# Patient Record
Sex: Female | Born: 1990 | Hispanic: Yes | Marital: Married | Smoking: Never smoker
Health system: Southern US, Community
[De-identification: ages and names within clinical notes are randomized; demographics above are authoritative.]

## PROBLEM LIST (undated history)

## (undated) DIAGNOSIS — F419 Anxiety disorder, unspecified: Secondary | ICD-10-CM

## (undated) DIAGNOSIS — J45909 Unspecified asthma, uncomplicated: Secondary | ICD-10-CM

## (undated) DIAGNOSIS — N200 Calculus of kidney: Secondary | ICD-10-CM

## (undated) DIAGNOSIS — E785 Hyperlipidemia, unspecified: Secondary | ICD-10-CM

## (undated) DIAGNOSIS — D259 Leiomyoma of uterus, unspecified: Secondary | ICD-10-CM

## (undated) DIAGNOSIS — R519 Headache, unspecified: Secondary | ICD-10-CM

## (undated) DIAGNOSIS — O10019 Pre-existing essential hypertension complicating pregnancy, unspecified trimester: Secondary | ICD-10-CM

## (undated) DIAGNOSIS — K760 Fatty (change of) liver, not elsewhere classified: Secondary | ICD-10-CM

## (undated) DIAGNOSIS — O99820 Streptococcus B carrier state complicating pregnancy: Secondary | ICD-10-CM

## (undated) DIAGNOSIS — G4700 Insomnia, unspecified: Secondary | ICD-10-CM

## (undated) DIAGNOSIS — R51 Headache: Secondary | ICD-10-CM

## (undated) DIAGNOSIS — O24419 Gestational diabetes mellitus in pregnancy, unspecified control: Secondary | ICD-10-CM

## (undated) HISTORY — DX: Fatty (change of) liver, not elsewhere classified: K76.0

## (undated) HISTORY — DX: Streptococcus B carrier state complicating pregnancy: O99.820

## (undated) HISTORY — DX: Hyperlipidemia, unspecified: E78.5

## (undated) HISTORY — DX: Headache, unspecified: R51.9

## (undated) HISTORY — DX: Calculus of kidney: N20.0

## (undated) HISTORY — DX: Headache: R51

## (undated) HISTORY — DX: Anxiety disorder, unspecified: F41.9

## (undated) HISTORY — DX: Pre-existing essential hypertension complicating pregnancy, unspecified trimester: O10.019

## (undated) HISTORY — DX: Leiomyoma of uterus, unspecified: D25.9

## (undated) HISTORY — DX: Insomnia, unspecified: G47.00

## (undated) HISTORY — DX: Gestational diabetes mellitus in pregnancy, unspecified control: O24.419

---
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
## 2010-06-21 ENCOUNTER — Ambulatory Visit: Payer: Self-pay | Admitting: Diagnostic Radiology

## 2010-06-21 ENCOUNTER — Emergency Department (HOSPITAL_BASED_OUTPATIENT_CLINIC_OR_DEPARTMENT_OTHER): Admission: EM | Admit: 2010-06-21 | Discharge: 2010-06-22 | Payer: Self-pay | Admitting: Emergency Medicine

## 2010-09-17 ENCOUNTER — Emergency Department (HOSPITAL_BASED_OUTPATIENT_CLINIC_OR_DEPARTMENT_OTHER): Admission: EM | Admit: 2010-09-17 | Discharge: 2010-09-17 | Payer: Self-pay | Admitting: Emergency Medicine

## 2011-02-15 ENCOUNTER — Ambulatory Visit (HOSPITAL_BASED_OUTPATIENT_CLINIC_OR_DEPARTMENT_OTHER)
Admission: RE | Admit: 2011-02-15 | Discharge: 2011-02-15 | Disposition: A | Discharge status: Home / Self Care | Payer: PRIVATE HEALTH INSURANCE | Source: Ambulatory Visit | Attending: Plastic Surgery | Admitting: Plastic Surgery

## 2011-02-15 ENCOUNTER — Other Ambulatory Visit: Payer: Self-pay | Admitting: Plastic Surgery

## 2011-02-15 DIAGNOSIS — N62 Hypertrophy of breast: Secondary | ICD-10-CM | POA: Insufficient documentation

## 2011-02-15 DIAGNOSIS — E669 Obesity, unspecified: Secondary | ICD-10-CM | POA: Insufficient documentation

## 2011-02-15 LAB — POCT HEMOGLOBIN-HEMACUE: Hemoglobin: 14 g/dL (ref 12.0–15.0)

## 2011-03-13 LAB — URINALYSIS, ROUTINE W REFLEX MICROSCOPIC
Bilirubin Urine: NEGATIVE
Ketones, ur: NEGATIVE mg/dL
Nitrite: NEGATIVE
Specific Gravity, Urine: 1.025 (ref 1.005–1.030)

## 2011-03-13 LAB — DIFFERENTIAL
Basophils Absolute: 0.1 10*3/uL (ref 0.0–0.1)
Eosinophils Relative: 8 % — ABNORMAL HIGH (ref 0–5)
Lymphs Abs: 3.7 10*3/uL (ref 0.7–4.0)
Monocytes Relative: 6 % (ref 3–12)
Neutro Abs: 9 10*3/uL — ABNORMAL HIGH (ref 1.7–7.7)

## 2011-03-13 LAB — CBC
Hemoglobin: 14 g/dL (ref 12.0–15.0)
MCH: 31 pg (ref 26.0–34.0)
WBC: 14.7 10*3/uL — ABNORMAL HIGH (ref 4.0–10.5)

## 2011-03-13 LAB — HEPATIC FUNCTION PANEL
ALT: 39 U/L — ABNORMAL HIGH (ref 0–35)
Albumin: 4.6 g/dL (ref 3.5–5.2)
Alkaline Phosphatase: 101 U/L (ref 39–117)
Bilirubin, Direct: 0 mg/dL (ref 0.0–0.3)
Total Bilirubin: 0.6 mg/dL (ref 0.3–1.2)
Total Protein: 8.4 g/dL — ABNORMAL HIGH (ref 6.0–8.3)

## 2011-03-13 LAB — BASIC METABOLIC PANEL
BUN: 10 mg/dL (ref 6–23)
CO2: 23 mEq/L (ref 19–32)
Calcium: 9.7 mg/dL (ref 8.4–10.5)
Chloride: 102 mEq/L (ref 96–112)
GFR calc Af Amer: >60 mL/min (ref >60)

## 2011-03-13 LAB — URINE MICROSCOPIC-ADD ON

## 2011-03-13 LAB — PREGNANCY, URINE: Preg Test, Ur: NEGATIVE

## 2012-12-26 NOTE — L&D Delivery Note (Signed)
Operative Delivery Note At 3:11 PM a viable female was delivered via Vaginal, Vacuum Neurosurgeon).  Presentation: vertex; Position: Left,, Occiput,, Anterior; Station: +2.  Vacuum placed for fetal heart rate recurrent Decels. Vacuum placed by dr. Leslie Andrea and confirmed by Dr. Sheldon Silvan. Delivered with no pop offs.   Verbal consent: obtained from patient.  Risks and benefits discussed in detail.  Risks include, but are not limited to the risks of anesthesia, bleeding, infection, damage to maternal tissues, fetal cephalhematoma.  There is also the risk of inability to effect vaginal delivery of the head, or shoulder dystocia that cannot be resolved by established maneuvers, leading to the need for emergency cesarean section.  APGAR: 6, 8; weight .   Placenta status: Intact, Spontaneous.   Cord: 3 vessels with the following complications: None.    Anesthesia: Epidural  Instruments: Kiwi vacuum Episiotomy: NA Lacerations: Deep posterior vaginal tear and left perilabial tear Suture Repair: 3.0 vicryl on CT Est. Blood Loss (mL):  450cc  Deep post vaginal laceration repaired with 3.0 vicryl on CT with assistance of Dr. Ihor Dow. Pt did not have any rectal communication. Confirmed by Dr. Sheldon Silvan.  Mom to AICU.  Baby to Nursery monitoring for pulse ox.  Fredrik Rigger 09/01/2013, 4:01 PM

## 2013-05-03 LAB — OB RESULTS CONSOLE RUBELLA ANTIBODY, IGM: Rubella: IMMUNE

## 2013-05-03 LAB — OB RESULTS CONSOLE RPR: RPR: NONREACTIVE

## 2013-05-03 LAB — OB RESULTS CONSOLE HIV ANTIBODY (ROUTINE TESTING): HIV: NONREACTIVE

## 2013-05-03 LAB — OB RESULTS CONSOLE HGB/HCT, BLOOD
HCT: 13 %
Hemoglobin: 38.8 g/dL

## 2013-05-03 LAB — OB RESULTS CONSOLE ABO/RH: RH Type: POSITIVE

## 2013-07-23 ENCOUNTER — Encounter: Payer: Self-pay | Admitting: *Deleted

## 2013-07-29 ENCOUNTER — Ambulatory Visit (INDEPENDENT_AMBULATORY_CARE_PROVIDER_SITE_OTHER): Payer: 59 | Admitting: Obstetrics & Gynecology

## 2013-07-29 ENCOUNTER — Encounter: Payer: Self-pay | Admitting: Obstetrics & Gynecology

## 2013-07-29 ENCOUNTER — Encounter: Payer: Self-pay | Admitting: *Deleted

## 2013-07-29 VITALS — BP 144/103 | Temp 97.2°F | Wt 166.6 lb

## 2013-07-29 DIAGNOSIS — O10913 Unspecified pre-existing hypertension complicating pregnancy, third trimester: Secondary | ICD-10-CM

## 2013-07-29 DIAGNOSIS — O10019 Pre-existing essential hypertension complicating pregnancy, unspecified trimester: Secondary | ICD-10-CM

## 2013-07-29 DIAGNOSIS — O169 Unspecified maternal hypertension, unspecified trimester: Secondary | ICD-10-CM

## 2013-07-29 DIAGNOSIS — O0933 Supervision of pregnancy with insufficient antenatal care, third trimester: Secondary | ICD-10-CM | POA: Insufficient documentation

## 2013-07-29 DIAGNOSIS — O0930 Supervision of pregnancy with insufficient antenatal care, unspecified trimester: Secondary | ICD-10-CM

## 2013-07-29 DIAGNOSIS — Z23 Encounter for immunization: Secondary | ICD-10-CM

## 2013-07-29 HISTORY — DX: Pre-existing essential hypertension complicating pregnancy, unspecified trimester: O10.019

## 2013-07-29 LAB — POCT URINALYSIS DIP (DEVICE)
Bilirubin Urine: NEGATIVE
Glucose, UA: NEGATIVE mg/dL
Ketones, ur: NEGATIVE mg/dL
Nitrite: NEGATIVE
Protein, ur: NEGATIVE mg/dL
Specific Gravity, Urine: 1.015 (ref 1.005–1.030)
Urobilinogen, UA: 0.2 mg/dL (ref 0.0–1.0)
pH: 7 (ref 5.0–8.0)

## 2013-07-29 MED ORDER — LABETALOL HCL 100 MG PO TABS: 100.0000 mg | ORAL_TABLET | Freq: Two times a day (BID) | ORAL | Status: DC

## 2013-07-29 MED ORDER — TETANUS-DIPHTH-ACELL PERTUSSIS 5-2.5-18.5 LF-MCG/0.5 IM SUSP: 0.5000 mL | Freq: Once | INTRAMUSCULAR | Status: DC

## 2013-07-29 NOTE — Progress Notes (Signed)
Transfer from [place], last seen 2 mo ago. CHTN, was on aldomet.   Subjective:transfer from [place], last seen 2 mo ago    Shelly Adams is a G1P0 43w5dbeing seen today for her first obstetrical visit. EDC by [place] is 9/28, EGA 32.1  Her obstetrical history is significant for chronic hypertension. Patient does intend to breast feed. Pregnancy history fully reviewed.  Patient reports no complaints.  Filed Vitals:   07/29/13 0946 07/29/13 0949  BP: 154/110 144/103  Temp: 97.2 F (36.2 C)   Weight: 166 lb 9.6 oz (75.569 kg)     HISTORY: OB History   Grav Para Term Preterm Abortions TAB SAB Ect Mult Living   1              # Outc Date GA Lbr Len/2nd Wgt Sex Del Anes PTL Lv   1 CUR              Past Medical History  Diagnosis Date  . Hyperlipemia   . Hypertension   . Recurrent kidney stones   . Insomnia    Past Surgical History  Procedure Laterality Date  . Breast reduction surgery  2009   Family History  Problem Relation Age of Onset  . Hypertension Mother   . Hyperlipidemia Mother   . GER disease Mother   . Hyperlipidemia Father   . Hypertension Father      Exam    Uterus:     Pelvic Exam:    Perineum: No Hemorrhoids   Vulva: normal   Vagina:  normal mucosa   pH:     Cervix: no lesions and posterior and closed   Adnexa: not evaluated   Bony Pelvis: average  System: Breast:      Skin: normal coloration and turgor, no rashes    Neurologic: oriented, normal mood   Extremities: normal strength, tone, and muscle mass   HEENT     Mouth/Teeth mucous membranes moist, pharynx normal without lesions   Neck supple   Cardiovascular:     Respiratory:  appears well, vitals normal, no respiratory distress, acyanotic, normal RR   Abdomen: gravid 32 cm   Urinary: urethral meatus normal      Assessment:    Pregnancy: G1P0 Patient Active Problem List   Diagnosis Date Noted  . Chronic hypertension in pregnancy 07/29/2013  . Insufficient prenatal  care in third trimester 07/29/2013        Plan:     Initial labs drawn. Prenatal vitamins. Problem list reviewed and updated.   Ultrasound discussed; fetal survey: ordered.  Follow up in 3 days 50% of 30 min visit spent on counseling and coordination of care.  Need prenatal record  Labetalol 100 mg BID Shelly Adams 07/29/2013

## 2013-07-29 NOTE — Patient Instructions (Signed)

## 2013-07-29 NOTE — Progress Notes (Signed)
Appointment for Ultrasound with MFM scheduled for 08/07/13 at 11 am.  Release of information faxed to [facility] [phone] - for OB records.

## 2013-07-29 NOTE — Progress Notes (Signed)
Pulse- 99   Edema-feet  Pressure-bladder  Pain-legs at night   Pt reports headaches New ob packet given Weight gain 25-35lb Tdap vaccine consented and info given Request records from [facility] facility for lab work

## 2013-07-30 LAB — GLUCOSE TOLERANCE, 1 HOUR (50G) W/O FASTING: Glucose, 1 Hour GTT: 154 mg/dL — ABNORMAL HIGH (ref 70–140)

## 2013-08-01 ENCOUNTER — Encounter: Payer: Self-pay | Admitting: Obstetrics and Gynecology

## 2013-08-01 ENCOUNTER — Ambulatory Visit (INDEPENDENT_AMBULATORY_CARE_PROVIDER_SITE_OTHER): Payer: PRIVATE HEALTH INSURANCE | Admitting: Obstetrics and Gynecology

## 2013-08-01 VITALS — BP 141/93 | Temp 97.6°F | Wt 167.5 lb

## 2013-08-01 DIAGNOSIS — D259 Leiomyoma of uterus, unspecified: Secondary | ICD-10-CM

## 2013-08-01 DIAGNOSIS — O0930 Supervision of pregnancy with insufficient antenatal care, unspecified trimester: Secondary | ICD-10-CM

## 2013-08-01 DIAGNOSIS — J45909 Unspecified asthma, uncomplicated: Secondary | ICD-10-CM | POA: Insufficient documentation

## 2013-08-01 DIAGNOSIS — O0933 Supervision of pregnancy with insufficient antenatal care, third trimester: Secondary | ICD-10-CM

## 2013-08-01 HISTORY — DX: Leiomyoma of uterus, unspecified: D25.9

## 2013-08-01 LAB — POCT URINALYSIS DIP (DEVICE): Nitrite: NEGATIVE

## 2013-08-01 NOTE — Progress Notes (Signed)
Pulse- 91  Edema-feet/hands  Pain-when lying down and get up  Pt reports migraines x 2 days

## 2013-08-01 NOTE — Progress Notes (Signed)
Per pt EDD 09/22/13 based on [place] in [place]. ROI. RLP and H/As discussed. Has not tried Tylenol yet. No aura or nausea; frequent frontal H/A's.  Needs 3hr GTT. Start FATs for [facility] with BPs >140/90. Has [place] scheduled for next wk.

## 2013-08-01 NOTE — Patient Instructions (Addendum)
Round Ligament Pain The round ligament is made up of muscle and fibrous tissue. It is attached to the uterus near the fallopian tube. The round ligament is located on both sides of the uterus and helps support the position of the uterus. It usually begins in the second trimester of pregnancy when the uterus comes out of the pelvis. The pain can come and go until the baby is delivered. Round ligament pain is not a serious problem and does not cause harm to the baby. CAUSE During pregnancy the uterus grows the most from the second trimester to delivery. As it grows, it stretches and slightly twists the round ligaments. When the uterus leans from one side to the other, the round ligament on the opposite side pulls and stretches. This can cause pain. SYMPTOMS  Pain can occur on one side or both sides. The pain is usually a short, sharp, and pinching-like. Sometimes it can be a dull, lingering and aching pain. The pain is located in the lower side of the abdomen or in the groin. The pain is internal and usually starts deep in the groin and moves up to the outside of the hip area. Pain can occur with:  Sudden change in position like getting out of bed or a chair.  Rolling over in bed.  Coughing or sneezing.  Walking too much.  Any type of physical activity. DIAGNOSIS  Your caregiver will make sure there are no serious problems causing the pain. When nothing serious is found, the symptoms usually indicate that the pain is from the round ligament. TREATMENT   Sit down and relax when the pain starts.  Flex your knees up to your belly.  Lay on your side with a pillow under your belly (abdomen) and another one between your legs.  Sit in a hot bath for 15 to 20 minutes or until the pain goes away. HOME CARE INSTRUCTIONS   Only take over-the-counter or prescriptions medicines for pain, discomfort or fever as directed by your caregiver.  Sit and stand slowly.  Avoid long walks if it causes  pain.  Stop or lessen your physical activities if it causes pain. SEEK MEDICAL CARE IF:   The pain does not go away with any of your treatment.  You need stronger medication for the pain.  You develop back pain that you did not have before with the side pain. SEEK IMMEDIATE MEDICAL CARE IF:   You develop a temperature of 102 F (38.9 C) or higher.  You develop uterine contractions.  You develop vaginal bleeding.  You develop nausea, vomiting or diarrhea.  You develop chills.  You have pain when you urinate. Document Released: 09/20/2008 Document Revised: 03/05/2012 Document Reviewed: 09/20/2008 [employer] Patient Information 2014 [place].

## 2013-08-02 ENCOUNTER — Other Ambulatory Visit: Payer: Self-pay

## 2013-08-02 DIAGNOSIS — O99810 Abnormal glucose complicating pregnancy: Secondary | ICD-10-CM

## 2013-08-02 LAB — GLUCOSE TOLERANCE, 3 HOURS: Glucose Tolerance, 1 hour: 199 mg/dL — ABNORMAL HIGH (ref 70–189)

## 2013-08-05 ENCOUNTER — Encounter: Payer: Self-pay | Admitting: Obstetrics and Gynecology

## 2013-08-05 ENCOUNTER — Ambulatory Visit (INDEPENDENT_AMBULATORY_CARE_PROVIDER_SITE_OTHER): Payer: 59 | Admitting: General Practice

## 2013-08-05 VITALS — BP 144/87 | Wt 166.8 lb

## 2013-08-05 DIAGNOSIS — O139 Gestational [pregnancy-induced] hypertension without significant proteinuria, unspecified trimester: Secondary | ICD-10-CM

## 2013-08-05 DIAGNOSIS — O133 Gestational [pregnancy-induced] hypertension without significant proteinuria, third trimester: Secondary | ICD-10-CM

## 2013-08-05 DIAGNOSIS — O99810 Abnormal glucose complicating pregnancy: Secondary | ICD-10-CM

## 2013-08-05 NOTE — Progress Notes (Signed)
NST performed today was reviewed and was found to be reactive.  Continue recommended antenatal testing and prenatal care.

## 2013-08-05 NOTE — Progress Notes (Signed)
Pulse- 87

## 2013-08-06 ENCOUNTER — Telehealth: Payer: Self-pay | Admitting: General Practice

## 2013-08-06 ENCOUNTER — Encounter: Payer: Self-pay | Admitting: *Deleted

## 2013-08-06 NOTE — Telephone Encounter (Signed)
Patient does have appt Monday 8/18 at 1pm. Called patient and informed her of results and need to meet with the diabetic educator and asked if she could come in Monday morning at 7:30,8:30, or 10:30 instead of 1pm. Patient verbalized understanding and stated she could come in at 10:30 that day for the NST and diabetes teaching. Patient had no further questions

## 2013-08-06 NOTE — Telephone Encounter (Signed)
Message copied by Shelly Coss on Tue Aug 06, 2013  2:55 PM ------      Message from: CONSTANT, [place]      Created: Mon Aug 05, 2013 11:26 AM       Please inform patient of abnormal 3 hour glucola test and diagnosis of diabetes. Please have patient meet ASAP with diabetic educator            Peggy ------

## 2013-08-07 ENCOUNTER — Other Ambulatory Visit: Payer: Self-pay | Admitting: Obstetrics & Gynecology

## 2013-08-07 ENCOUNTER — Ambulatory Visit (HOSPITAL_COMMUNITY)
Admission: RE | Admit: 2013-08-07 | Discharge: 2013-08-07 | Disposition: A | Discharge status: Home / Self Care | Payer: PRIVATE HEALTH INSURANCE | Source: Ambulatory Visit | Attending: Obstetrics and Gynecology | Admitting: Obstetrics and Gynecology

## 2013-08-07 DIAGNOSIS — O169 Unspecified maternal hypertension, unspecified trimester: Secondary | ICD-10-CM

## 2013-08-07 DIAGNOSIS — O10913 Unspecified pre-existing hypertension complicating pregnancy, third trimester: Secondary | ICD-10-CM

## 2013-08-07 DIAGNOSIS — O0933 Supervision of pregnancy with insufficient antenatal care, third trimester: Secondary | ICD-10-CM

## 2013-08-07 DIAGNOSIS — Z23 Encounter for immunization: Secondary | ICD-10-CM

## 2013-08-07 DIAGNOSIS — O99810 Abnormal glucose complicating pregnancy: Secondary | ICD-10-CM | POA: Insufficient documentation

## 2013-08-07 DIAGNOSIS — Z1389 Encounter for screening for other disorder: Secondary | ICD-10-CM | POA: Insufficient documentation

## 2013-08-07 DIAGNOSIS — Z363 Encounter for antenatal screening for malformations: Secondary | ICD-10-CM | POA: Insufficient documentation

## 2013-08-07 DIAGNOSIS — O358XX0 Maternal care for other (suspected) fetal abnormality and damage, not applicable or unspecified: Secondary | ICD-10-CM | POA: Insufficient documentation

## 2013-08-07 DIAGNOSIS — O10019 Pre-existing essential hypertension complicating pregnancy, unspecified trimester: Secondary | ICD-10-CM | POA: Insufficient documentation

## 2013-08-07 NOTE — Progress Notes (Signed)
[facility] ultrasound  Indication: 22 yr old G1P0 at 56w3dwith chroinc hypertension and gestational diabetes A1 for fetal anatomic survey.  Findings: 1. Single intrauterine pregnancy. 2. Estimated fetal weight is in the 54th%. 3. Posterior placenta without evidence of previa. 4. Normal amniotic fluid index. 5. There is a fundal fibroid measuring 4.7x3.7cm. 6. The fetal anatomic survey is limited by advanced gestational age; no abnormalities were seen.  Recommendations: 1. Appropriate fetal growth. 2. Limited anatomy survey: - recommend refer for follow up if would like reattempt at anatomy 3. Chronic hypertension: - no consult requested - on labetalol - recommend fetal growth every 4 weeks - recommend continue antenatal testing with twice weekly NSTs and weekly AFI - recommend close surveillance for the development of signs/symptoms of preeclampsia - recommend delivery by estimated due date but not prior to 38 weeks in the absence of other complications 4. Gestational diabetes: - no consult requested - currently diet controlled - recommend strict glucose control - recommend fetal growth and delivery as above 5. Fibroid: - recommend continue to monitor size - recommend fetal growth as above  [place] MD

## 2013-08-08 ENCOUNTER — Ambulatory Visit (INDEPENDENT_AMBULATORY_CARE_PROVIDER_SITE_OTHER): Payer: 59 | Admitting: Obstetrics & Gynecology

## 2013-08-08 VITALS — BP 126/75 | Temp 98.9°F | Wt 165.4 lb

## 2013-08-08 DIAGNOSIS — O99810 Abnormal glucose complicating pregnancy: Secondary | ICD-10-CM

## 2013-08-08 DIAGNOSIS — O10013 Pre-existing essential hypertension complicating pregnancy, third trimester: Secondary | ICD-10-CM

## 2013-08-08 DIAGNOSIS — O10019 Pre-existing essential hypertension complicating pregnancy, unspecified trimester: Secondary | ICD-10-CM

## 2013-08-08 LAB — POCT URINALYSIS DIP (DEVICE)
Bilirubin Urine: NEGATIVE
pH: 6 (ref 5.0–8.0)

## 2013-08-08 MED ORDER — GLUCOSE BLOOD VI STRP: ORAL_STRIP | Status: DC

## 2013-08-08 NOTE — Progress Notes (Signed)
P=85, Gave patient diabetic diet handouts, taught her how to check cbg's- will see nutritionist and diabetic educator at next visit.

## 2013-08-08 NOTE — Progress Notes (Signed)
Start blood glucose testing for GDM. NST reactive today

## 2013-08-08 NOTE — Patient Instructions (Addendum)
Gestational Diabetes Mellitus Gestational diabetes mellitus, often simply referred to as gestational diabetes, is a type of diabetes that some women develop during pregnancy. In gestational diabetes, the pancreas does not make enough insulin (a hormone), the cells are less responsive to the insulin that is made (insulin resistance), or both.Normally, insulin moves sugars from food into the tissue cells. The tissue cells use the sugars for energy. The lack of insulin or the lack of normal response to insulin causes excess sugars to build up in the blood instead of going into the tissue cells. As a result, high blood sugar (hyperglycemia) develops. The effect of high sugar (glucose) levels can cause many complications.  RISK FACTORS You have an increased chance of developing gestational diabetes if you have a family history of diabetes and also have one or more of the following risk factors:  A body mass index over 30 (obesity).  A previous pregnancy with gestational diabetes.  An older age at the time of pregnancy. If blood glucose levels are kept in the normal range during pregnancy, women can have a healthy pregnancy. If your blood glucose levels are not well controlled, there may be risks to you, your unborn baby (fetus), your labor and delivery, or your newborn baby.  SYMPTOMS  If symptoms are experienced, they are much like symptoms you would normally expect during pregnancy. The symptoms of gestational diabetes include:   Increased thirst (polydipsia).  Increased urination (polyuria).  Increased urination during the night (nocturia).  Weight loss. This weight loss may be rapid.  Frequent, recurring infections.  Tiredness (fatigue).  Weakness.  Vision changes, such as blurred vision.  Fruity smell to your breath.  Abdominal pain. DIAGNOSIS Diabetes is diagnosed when blood glucose levels are increased. Your blood glucose level may be checked by one or more of the following blood  tests:  A fasting blood glucose test. You will not be allowed to eat for at least 8 hours before a blood sample is taken.  A random blood glucose test. Your blood glucose is checked at any time of the day regardless of when you ate.  A hemoglobin A1c blood glucose test. A hemoglobin A1c test provides information about blood glucose control over the previous 3 months.  An oral glucose tolerance test (OGTT). Your blood glucose is measured after you have not eaten (fasted) for 1 3 hours and then after you drink a glucose-containing beverage. Since the hormones that cause insulin resistance are highest at about 24 28 weeks of a pregnancy, an OGTT is usually performed during that time. If you have risk factors for gestational diabetes, your caregiver may test you for gestational diabetes earlier than 24 weeks of pregnancy. TREATMENT   You will need to take diabetes medicine or insulin daily to keep blood glucose levels in the desired range.  You will need to match insulin dosing with exercise and healthy food choices. The treatment goal is to maintain the before meal (preprandial), bedtime, and overnight blood glucose level at 60 99 mg/dL during pregnancy. The treatment goal is to further maintain peak after meal blood sugar (postprandial glucose) level at 100 140 mg/dL.  HOME CARE INSTRUCTIONS   Have your hemoglobin A1c level checked twice a year.  Perform daily blood glucose monitoring as directed by your caregiver. It is common to perform frequent blood glucose monitoring.  Monitor urine ketones when you are ill and as directed by your caregiver.  Take your diabetes medicine and insulin as directed by your caregiver to  maintain your blood glucose level in the desired range.  Never run out of diabetes medicine or insulin. It is needed every day.  Adjust insulin based on your intake of carbohydrates. Carbohydrates can raise blood glucose levels but need to be included in your diet.  Carbohydrates provide vitamins, minerals, and fiber which are an essential part of a healthy diet. Carbohydrates are found in fruits, vegetables, whole grains, dairy products, legumes, and foods containing added sugars.    Eat healthy foods. Alternate 3 meals with 3 snacks.  Maintain a healthy weight gain. The usual total expected weight gain varies according to your prepregnancy body mass index (BMI).  Carry a medical alert card or wear your medical alert jewelry.  Carry a 15 gram carbohydrate snack with you at all times to treat low blood glucose (hypoglycemia). Some examples of 15 gram carbohydrate snacks include:  Glucose tablets, 3 or 4   Glucose gel, 15 gram tube  Raisins, 2 tablespoons (24 g)  Jelly beans, 6  Animal crackers, 8  Fruit juice, regular soda, or low fat milk, 4 ounces (120 mL)  Gummy treats, 9    Recognize hypoglycemia. Hypoglycemia during pregnancy occurs with blood glucose levels of 60 mg/dL and below. The risk for hypoglycemia increases when fasting or skipping meals, during or after intense exercise, and during sleep. Hypoglycemia symptoms can include:  Tremors or shakes.  Decreased ability to concentrate.  Sweating.  Increased heart rate.  Headache.  Dry mouth.  Hunger.  Irritability.  Anxiety.  Restless sleep.  Altered speech or coordination.  Confusion.  Treat hypoglycemia promptly. If you are alert and able to safely swallow, follow the 15:15 rule:  Take 15 20 grams of rapid-acting glucose or carbohydrate. Rapid-acting options include glucose gel, glucose tablets, or 4 ounces (120 mL) of fruit juice, regular soda, or low fat milk.  Check your blood glucose level 15 minutes after taking the glucose.   Take 15 20 grams more of glucose if the repeat blood glucose level is still 70 mg/dL or below.  Eat a meal or snack within 1 hour once blood glucose levels return to normal.  Be alert to polyuria and polydipsia which are early  signs of hyperglycemia. An early awareness of hyperglycemia allows for prompt treatment. Treat hyperglycemia as directed by your caregiver.  Engage in at least 30 minutes of physical activity a day or as directed by your caregiver. Ten minutes of physical activity timed 30 minutes after each meal is encouraged to control postprandial blood glucose levels.  Adjust your insulin dosing and food intake as needed if you start a new exercise or sport.  Follow your sick day plan at any time you are unable to eat or drink as usual.  Avoid tobacco and alcohol use.  Follow up with your caregiver regularly.  Follow the advice of your caregiver regarding your prenatal and post-delivery (postpartum) appointments, meal planning, exercise, medicines, vitamins, blood tests, other medical tests, and physical activities.  Perform daily skin and foot care. Examine your skin and feet daily for cuts, bruises, redness, nail problems, bleeding, blisters, or sores.  Brush your teeth and gums at least twice a day and floss at least once a day. Follow up with your dentist regularly.  Schedule an eye exam during the first trimester of your pregnancy or as directed by your caregiver.  Share your diabetes management plan with your workplace or school.  Stay up-to-date with immunizations.  Learn to manage stress.  Obtain ongoing diabetes education  and support as needed. SEEK MEDICAL CARE IF:   You are unable to eat food or drink fluids for more than 6 hours.  You have nausea and vomiting for more than 6 hours.  You have a blood glucose level of 200 mg/dL and you have ketones in your urine.  There is a change in mental status.  You develop vision problems.  You have a persistent headache.  You have upper abdominal pain or discomfort.  You develop an additional serious illness.  You have diarrhea for more than 6 hours.  You have been sick or have had a fever for a couple of days and are not getting  better. SEEK IMMEDIATE MEDICAL CARE IF:   You have difficulty breathing.  You no longer feel the baby moving.  You are bleeding or have discharge from your vagina.  You start having premature contractions or labor. MAKE SURE YOU:  Understand these instructions.  Will watch your condition.  Will get help right away if you are not doing well or get worse. Document Released: 03/20/2001 Document Revised: 09/05/2012 Document Reviewed: 07/10/2012 [facility] Patient Information 2014 [place], [place].

## 2013-08-12 ENCOUNTER — Other Ambulatory Visit: Payer: Self-pay

## 2013-08-12 ENCOUNTER — Encounter: Payer: Self-pay | Admitting: Family Medicine

## 2013-08-12 ENCOUNTER — Ambulatory Visit (INDEPENDENT_AMBULATORY_CARE_PROVIDER_SITE_OTHER): Payer: 59 | Admitting: Family Medicine

## 2013-08-12 VITALS — BP 118/84 | Temp 97.5°F | Wt 167.5 lb

## 2013-08-12 DIAGNOSIS — E119 Type 2 diabetes mellitus without complications: Secondary | ICD-10-CM

## 2013-08-12 DIAGNOSIS — O10013 Pre-existing essential hypertension complicating pregnancy, third trimester: Secondary | ICD-10-CM

## 2013-08-12 DIAGNOSIS — O24919 Unspecified diabetes mellitus in pregnancy, unspecified trimester: Secondary | ICD-10-CM

## 2013-08-12 DIAGNOSIS — R87629 Unspecified abnormal cytological findings in specimens from vagina: Secondary | ICD-10-CM | POA: Insufficient documentation

## 2013-08-12 DIAGNOSIS — O10019 Pre-existing essential hypertension complicating pregnancy, unspecified trimester: Secondary | ICD-10-CM

## 2013-08-12 DIAGNOSIS — O219 Vomiting of pregnancy, unspecified: Secondary | ICD-10-CM

## 2013-08-12 DIAGNOSIS — O09899 Supervision of other high risk pregnancies, unspecified trimester: Secondary | ICD-10-CM

## 2013-08-12 DIAGNOSIS — O0930 Supervision of pregnancy with insufficient antenatal care, unspecified trimester: Secondary | ICD-10-CM

## 2013-08-12 DIAGNOSIS — O0933 Supervision of pregnancy with insufficient antenatal care, third trimester: Secondary | ICD-10-CM

## 2013-08-12 DIAGNOSIS — J45909 Unspecified asthma, uncomplicated: Secondary | ICD-10-CM

## 2013-08-12 DIAGNOSIS — O99810 Abnormal glucose complicating pregnancy: Secondary | ICD-10-CM

## 2013-08-12 LAB — POCT URINALYSIS DIP (DEVICE)
Nitrite: NEGATIVE
Protein, ur: NEGATIVE mg/dL
Specific Gravity, Urine: 1.02 (ref 1.005–1.030)
Urobilinogen, UA: 0.2 mg/dL (ref 0.0–1.0)

## 2013-08-12 NOTE — Progress Notes (Signed)
Nutrition note: 1st visit consult and GDM diet education Pt is a newly diagnosed GDM pt. Pt has gained 17.5# @ [redacted]w[redacted]d which is wnl. Pt reports eating 1-2 meals & 2-3 snacks/d. Pt reports no N/V or heartburn. NKFA. Pt is taking PNV. Pt reports walking most days for ~1hr. Pt received verbal & written education on GDM diet. Provided pt with Diabetes MyPlate handout. Discussed wt gain goals of 15-25# or 0.6#/wk. Pt agrees to follow GDM diet with 3 meals & 3 snacks/d with proper CHO/ protein combination. [place] plans to BF. F/u in 2-4 wks LVladimir Faster MS, RD, LDN

## 2013-08-12 NOTE — Progress Notes (Signed)
  Subjective:    Shelly Adams is a 22 y.o. G1P0 at 58w1dgestation being seen today for her obstetrical visit. Her obstetrical history is significant for diabetes mellitus, gestational. Other risk factors include: Chronic HTN. Pregnancy history fully reviewed. Patient reports headache, no bleeding, no cramping, no leaking and occasional contractions. Fetal movement: normal. Just diagnosed with DM. Will start sugars today  Menstrual History: OB History   Grav Para Term Preterm Abortions TAB SAB Ect Mult Living   1                Patient's last menstrual period was 11/21/2012.    The following portions of the patient's history were reviewed and updated as appropriate: allergies, current medications, past family history, past medical history, past social history, past surgical history and problem list.  Review of Systems Pertinent items are noted in HPI.    Objective:    BP 118/84  Temp(Src) 97.5 F (36.4 C)  Wt 75.978 kg (167 lb 8 oz)  BMI 31.67 kg/m2  LMP 11/21/2012 General:   alert, cooperative, appears stated age and no distress  FHT:  131 BPM  Uterine Size: 35 cm and size equals dates   Lab Review Urine dip: neg gluc and protein      Assessment:    1. Intrauterine pregnancy of 349w1dwith normal growth. 2. Diabetes Mellitus seeing nutrition and nursing today 3. NST: 130s mod var, +accels no decels     Glucose, Bld  Date Value Range Status  06/21/2010 119* 70 - 99 mg/dL Final     Plan:   ErTashi Andujos a [age].o. G1P0 at 3483w1dre for [place].  Discussed DM managmenet Discussed HTN sx for Preeclampsia. No evidence at this time. Well controlled on labetalol  Discussed with Patient:  -Plans to breast/bottle feed.  All questions answered. -Continue prenatal vitamins. -Reviewed fetal kick counts Pt to perform daily at a time when the baby is active, lie laterally with both hands on belly in quiet room and count all movements (hiccups, shoulder rolls, obvious  kicks, etc); pt is to report to clinic L&D for less than 10 movements felt in a one hour time period-pt told as soon as she counts 10 movements the count is complete.  - Routine precautions discussed (depression, infection s/s).   Patient provided with all pertinent phone numbers for emergencies. - RTC for any VB, regular, painful cramps/ctxs occurring at a rate of >2/10 min, fever (100.5 or higher), n/v/d, any pain that is unresolving or worsening, LOF, decreased fetal movement, CP, SOB, edema - RTC in 2 weeks for next appt.   Problems: Patient Active Problem List   Diagnosis Date Noted  . Gestational Diabetes 08/05/2013  . 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 Asthma 08/01/2013  . Chronic hypertension in pregnancy 07/29/2013  . Insufficient prenatal care in third trimester 07/29/2013    To Do: 1. Diabetes education/counciling  [ x ] Vaccines: Flu:  Tdap: recd [ ]  BCM: undecided [ ]  Readiness: baby has a place to sleep, car seat, other baby necessities.  Edu: [x ] PTL precautions; [ ]  BF class; [ ]  childbirth class; [ ]   BF counseling;

## 2013-08-12 NOTE — Patient Instructions (Signed)
Gestational Diabetes Mellitus Gestational diabetes mellitus, often simply referred to as gestational diabetes, is a type of diabetes that some women develop during pregnancy. In gestational diabetes, the pancreas does not make enough insulin (a hormone), the cells are less responsive to the insulin that is made (insulin resistance), or both.Normally, insulin moves sugars from food into the tissue cells. The tissue cells use the sugars for energy. The lack of insulin or the lack of normal response to insulin causes excess sugars to build up in the blood instead of going into the tissue cells. As a result, high blood sugar (hyperglycemia) develops. The effect of high sugar (glucose) levels can cause many complications.  RISK FACTORS You have an increased chance of developing gestational diabetes if you have a family history of diabetes and also have one or more of the following risk factors:  A body mass index over 30 (obesity).  A previous pregnancy with gestational diabetes.  An older age at the time of pregnancy. If blood glucose levels are kept in the normal range during pregnancy, women can have a healthy pregnancy. If your blood glucose levels are not well controlled, there may be risks to you, your unborn baby (fetus), your labor and delivery, or your newborn baby.  SYMPTOMS  If symptoms are experienced, they are much like symptoms you would normally expect during pregnancy. The symptoms of gestational diabetes include:   Increased thirst (polydipsia).  Increased urination (polyuria).  Increased urination during the night (nocturia).  Weight loss. This weight loss may be rapid.  Frequent, recurring infections.  Tiredness (fatigue).  Weakness.  Vision changes, such as blurred vision.  Fruity smell to your breath.  Abdominal pain. DIAGNOSIS Diabetes is diagnosed when blood glucose levels are increased. Your blood glucose level may be checked by one or more of the following  blood tests:  A fasting blood glucose test. You will not be allowed to eat for at least 8 hours before a blood sample is taken.  A random blood glucose test. Your blood glucose is checked at any time of the day regardless of when you ate.  A hemoglobin A1c blood glucose test. A hemoglobin A1c test provides information about blood glucose control over the previous 3 months.  An oral glucose tolerance test (OGTT). Your blood glucose is measured after you have not eaten (fasted) for 1 3 hours and then after you drink a glucose-containing beverage. Since the hormones that cause insulin resistance are highest at about 24 28 weeks of a pregnancy, an OGTT is usually performed during that time. If you have risk factors for gestational diabetes, your caregiver may test you for gestational diabetes earlier than 24 weeks of pregnancy. TREATMENT   You will need to take diabetes medicine or insulin daily to keep blood glucose levels in the desired range.  You will need to match insulin dosing with exercise and healthy food choices. The treatment goal is to maintain the before meal (preprandial), bedtime, and overnight blood glucose level at 60 99 mg/dL during pregnancy. The treatment goal is to further maintain peak after meal blood sugar (postprandial glucose) level at 100 140 mg/dL.  HOME CARE INSTRUCTIONS   Have your hemoglobin A1c level checked twice a year.  Perform daily blood glucose monitoring as directed by your caregiver. It is common to perform frequent blood glucose monitoring.  Monitor urine ketones when you are ill and as directed by your caregiver.  Take your diabetes medicine and insulin as directed by your caregiver to  maintain your blood glucose level in the desired range.  Never run out of diabetes medicine or insulin. It is needed every day.  Adjust insulin based on your intake of carbohydrates. Carbohydrates can raise blood glucose levels but need to be included in your diet.  Carbohydrates provide vitamins, minerals, and fiber which are an essential part of a healthy diet. Carbohydrates are found in fruits, vegetables, whole grains, dairy products, legumes, and foods containing added sugars.    Eat healthy foods. Alternate 3 meals with 3 snacks.  Maintain a healthy weight gain. The usual total expected weight gain varies according to your prepregnancy body mass index (BMI).  Carry a medical alert card or wear your medical alert jewelry.  Carry a 15 gram carbohydrate snack with you at all times to treat low blood glucose (hypoglycemia). Some examples of 15 gram carbohydrate snacks include:  Glucose tablets, 3 or 4   Glucose gel, 15 gram tube  Raisins, 2 tablespoons (24 g)  Jelly beans, 6  Animal crackers, 8  Fruit juice, regular soda, or low fat milk, 4 ounces (120 mL)  Gummy treats, 9    Recognize hypoglycemia. Hypoglycemia during pregnancy occurs with blood glucose levels of 60 mg/dL and below. The risk for hypoglycemia increases when fasting or skipping meals, during or after intense exercise, and during sleep. Hypoglycemia symptoms can include:  Tremors or shakes.  Decreased ability to concentrate.  Sweating.  Increased heart rate.  Headache.  Dry mouth.  Hunger.  Irritability.  Anxiety.  Restless sleep.  Altered speech or coordination.  Confusion.  Treat hypoglycemia promptly. If you are alert and able to safely swallow, follow the 15:15 rule:  Take 15 20 grams of rapid-acting glucose or carbohydrate. Rapid-acting options include glucose gel, glucose tablets, or 4 ounces (120 mL) of fruit juice, regular soda, or low fat milk.  Check your blood glucose level 15 minutes after taking the glucose.   Take 15 20 grams more of glucose if the repeat blood glucose level is still 70 mg/dL or below.  Eat a meal or snack within 1 hour once blood glucose levels return to normal.  Be alert to polyuria and polydipsia which are early  signs of hyperglycemia. An early awareness of hyperglycemia allows for prompt treatment. Treat hyperglycemia as directed by your caregiver.  Engage in at least 30 minutes of physical activity a day or as directed by your caregiver. Ten minutes of physical activity timed 30 minutes after each meal is encouraged to control postprandial blood glucose levels.  Adjust your insulin dosing and food intake as needed if you start a new exercise or sport.  Follow your sick day plan at any time you are unable to eat or drink as usual.  Avoid tobacco and alcohol use.  Follow up with your caregiver regularly.  Follow the advice of your caregiver regarding your prenatal and post-delivery (postpartum) appointments, meal planning, exercise, medicines, vitamins, blood tests, other medical tests, and physical activities.  Perform daily skin and foot care. Examine your skin and feet daily for cuts, bruises, redness, nail problems, bleeding, blisters, or sores.  Brush your teeth and gums at least twice a day and floss at least once a day. Follow up with your dentist regularly.  Schedule an eye exam during the first trimester of your pregnancy or as directed by your caregiver.  Share your diabetes management plan with your workplace or school.  Stay up-to-date with immunizations.  Learn to manage stress.  Obtain ongoing diabetes education  and support as needed. SEEK MEDICAL CARE IF:   You are unable to eat food or drink fluids for more than 6 hours.  You have nausea and vomiting for more than 6 hours.  You have a blood glucose level of 200 mg/dL and you have ketones in your urine.  There is a change in mental status.  You develop vision problems.  You have a persistent headache.  You have upper abdominal pain or discomfort.  You develop an additional serious illness.  You have diarrhea for more than 6 hours.  You have been sick or have had a fever for a couple of days and are not getting  better. SEEK IMMEDIATE MEDICAL CARE IF:   You have difficulty breathing.  You no longer feel the baby moving.  You are bleeding or have discharge from your vagina.  You start having premature contractions or labor. MAKE SURE YOU:  Understand these instructions.  Will watch your condition.  Will get help right away if you are not doing well or get worse. Document Released: 03/20/2001 Document Revised: 09/05/2012 Document Reviewed: 07/10/2012 [facility] Patient Information 2014 [place], [place].

## 2013-08-12 NOTE — Progress Notes (Signed)
GDM Education:New onset GDM. Dispensed True Track glucometer, lancets (#100) and test strips(#50). Education provided with teach back. Patient understand need for testing 4Xdaily.

## 2013-08-12 NOTE — Progress Notes (Signed)
Pulse- 88 Patient reports some contractions

## 2013-08-14 ENCOUNTER — Encounter: Payer: Self-pay | Admitting: Obstetrics and Gynecology

## 2013-08-16 ENCOUNTER — Ambulatory Visit (INDEPENDENT_AMBULATORY_CARE_PROVIDER_SITE_OTHER): Payer: 59 | Admitting: *Deleted

## 2013-08-16 VITALS — BP 133/97

## 2013-08-16 DIAGNOSIS — O10019 Pre-existing essential hypertension complicating pregnancy, unspecified trimester: Secondary | ICD-10-CM

## 2013-08-16 DIAGNOSIS — O10013 Pre-existing essential hypertension complicating pregnancy, third trimester: Secondary | ICD-10-CM

## 2013-08-16 LAB — CBC
Hemoglobin: 13.3 g/dL (ref 12.0–15.0)
MCHC: 34.5 g/dL (ref 30.0–36.0)
RDW: 13.9 % (ref 11.5–15.5)
WBC: 9.3 10*3/uL (ref 4.0–10.5)

## 2013-08-16 LAB — COMPREHENSIVE METABOLIC PANEL
AST: 20 U/L (ref 0–37)
Albumin: 3.3 g/dL — ABNORMAL LOW (ref 3.5–5.2)
Alkaline Phosphatase: 138 U/L — ABNORMAL HIGH (ref 39–117)
Glucose, Bld: 71 mg/dL (ref 70–99)
Potassium: 3.8 mEq/L (ref 3.5–5.3)
Sodium: 134 mEq/L — ABNORMAL LOW (ref 135–145)
Total Protein: 6.3 g/dL (ref 6.0–8.3)

## 2013-08-16 NOTE — Progress Notes (Signed)
NST performed today was reviewed and was found to be reactive.  Normal AFI at 16.5 cm. Continue recommended antenatal testing and prenatal care.

## 2013-08-16 NOTE — Progress Notes (Signed)
P = 74     Pt denies H/A or visual disturbances.  [place] labs drawn, pt will collect 24 hr urine and bring to next clinic visit on 8/25.

## 2013-08-18 ENCOUNTER — Encounter (HOSPITAL_COMMUNITY): Payer: Self-pay | Admitting: *Deleted

## 2013-08-18 ENCOUNTER — Inpatient Hospital Stay (HOSPITAL_COMMUNITY)
Admission: AD | Admit: 2013-08-18 | Discharge: 2013-08-18 | Disposition: A | Discharge status: Home / Self Care | Payer: Medicaid Other | Source: Ambulatory Visit | Attending: Obstetrics & Gynecology | Admitting: Obstetrics & Gynecology

## 2013-08-18 DIAGNOSIS — O99810 Abnormal glucose complicating pregnancy: Secondary | ICD-10-CM | POA: Insufficient documentation

## 2013-08-18 DIAGNOSIS — I10 Essential (primary) hypertension: Secondary | ICD-10-CM | POA: Insufficient documentation

## 2013-08-18 DIAGNOSIS — O479 False labor, unspecified: Secondary | ICD-10-CM

## 2013-08-18 DIAGNOSIS — O1403 Mild to moderate pre-eclampsia, third trimester: Secondary | ICD-10-CM

## 2013-08-18 DIAGNOSIS — IMO0002 Reserved for concepts with insufficient information to code with codable children: Secondary | ICD-10-CM | POA: Insufficient documentation

## 2013-08-18 DIAGNOSIS — O4703 False labor before 37 completed weeks of gestation, third trimester: Secondary | ICD-10-CM

## 2013-08-18 DIAGNOSIS — O4700 False labor before 37 completed weeks of gestation, unspecified trimester: Secondary | ICD-10-CM | POA: Insufficient documentation

## 2013-08-18 LAB — COMPREHENSIVE METABOLIC PANEL
AST: 21 U/L (ref 0–37)
Albumin: 2.7 g/dL — ABNORMAL LOW (ref 3.5–5.2)
Chloride: 103 mEq/L (ref 96–112)
Creatinine, Ser: 0.53 mg/dL (ref 0.50–1.10)
Potassium: 3.8 mEq/L (ref 3.5–5.1)
Total Bilirubin: 0.2 mg/dL — ABNORMAL LOW (ref 0.3–1.2)
Total Protein: 6.3 g/dL (ref 6.0–8.3)

## 2013-08-18 LAB — CBC
Platelets: 196 10*3/uL (ref 150–400)
RDW: 13.4 % (ref 11.5–15.5)
WBC: 13.1 10*3/uL — ABNORMAL HIGH (ref 4.0–10.5)

## 2013-08-18 LAB — OB RESULTS CONSOLE GBS: GBS: NEGATIVE

## 2013-08-18 LAB — URINALYSIS, ROUTINE W REFLEX MICROSCOPIC
Nitrite: NEGATIVE
Specific Gravity, Urine: 1.01 (ref 1.005–1.030)
Urobilinogen, UA: 0.2 mg/dL (ref 0.0–1.0)

## 2013-08-18 LAB — URINE MICROSCOPIC-ADD ON

## 2013-08-18 LAB — PROTEIN / CREATININE RATIO, URINE: Total Protein, Urine: 12.7 mg/dL

## 2013-08-18 NOTE — MAU Note (Addendum)
Contractions started at 2200, now every 4 min.  Denies bleeding. ? Leaking this morning started while in the shower. 1st baby, neg hx

## 2013-08-18 NOTE — MAU Note (Signed)
Pt states she transferred here from [place] and she was told she had gestational diabetes and hypertension last week here at the clinic

## 2013-08-18 NOTE — MAU Provider Note (Signed)
History      CSN: [id number]  Arrival date and time: 08/18/13 6213   First Provider Initiated Contact with Patient 08/18/13 [phone]      Chief Complaint  Patient presents with  . Labor Eval   HPI Comments: [name] is a 22 yo G1 at 23w0dwith A1 gestational diabetes and chronic HTN presenting today with contractions since yestserday at 10pm They have been painful (dull), regular every 3 minutes and last approximately 1.5 minutes. She has lower back pain that moves to the front.She reports good fetal movement. No vaginal bleeding. She noticed a little bit of clear fluid leaking when she took a shower this morning, but no gush of fluid. [place] 2 [place]. She has a history of intermittent headaches, scatoma when having headaches, sharp RUQ pain intermittently, increased swelling in hands, feet and lips. No n/v, no diarrhea, no chest pain, sometimes SOB. She has not had seizures.   OB History   Grav Para Term Preterm Abortions TAB SAB Ect Mult Living   1               Past Medical History  Diagnosis Date  . Hyperlipemia   . Hypertension   . Recurrent kidney stones   . Insomnia   . Anxiety   . Gestational diabetes     Past Surgical History  Procedure Laterality Date  . Breast reduction surgery  2009    Family History  Problem Relation Age of Onset  . Hypertension Mother   . Hyperlipidemia Mother   . GER disease Mother   . Hyperlipidemia Father   . Hypertension Father     History  Substance Use Topics  . Smoking status: Never Smoker   . Smokeless tobacco: Never Used  . Alcohol Use: No    Allergies: No Known Allergies  Facility-administered medications prior to admission  Medication Dose Route Frequency Provider Last Rate Last Dose  . TDaP (BOOSTRIX) injection 0.5 mL  0.5 mL Intramuscular Once JWoodroe Mode MD       Prescriptions prior to admission  Medication Sig Dispense Refill  . Fish Oil OIL Take 1 tablet  by mouth daily. 200 mg tablet      . labetalol (NORMODYNE) 100 MG tablet Take 1 tablet (100 mg total) by mouth 2 (two) times daily.  60 tablet  2  . Prenatal Vit-Fe Fumarate-FA (PRENATAL MULTIVITAMIN) TABS tablet Take 1 tablet by mouth daily at 12 noon.      . ALBUTEROL IN Inhale into the lungs.      .Marland Kitchenglucose blood (BL TEST STRIP PACK) test strip Use as instructed  100 each  12    Review of Systems  Constitutional: Negative for fever and chills.  Eyes: Negative for blurred vision.       Hx of scatoma while having headaches  Respiratory:       Mild shortness of breath  Cardiovascular: Positive for leg swelling. Negative for chest pain and palpitations.       Positive for hand and lip swelling  Gastrointestinal: Negative for nausea and vomiting.       History of sharp right upper quadrant pain  Genitourinary: Negative for dysuria.       Dull pain from regular contractions  Neurological: Positive for headaches. Negative for seizures and loss of consciousness.   Physical Exam   Filed Vitals:   08/18/13 [id number]/24/14 0914 08/18/13 0929 08/18/13 0944  BP: 140/94 149/99  148/81 137/83  Pulse: 86 77 88 83    Last menstrual period 11/21/2012.  Physical Exam  Constitutional: She is oriented to person, place, and time. She appears well-developed and well-nourished. No distress.  Cardiovascular: Normal rate, regular rhythm and normal heart sounds.   Respiratory: Effort normal and breath sounds normal. She has no wheezes.  GI: Soft. There is tenderness.  Mid epigastric tenderness  Musculoskeletal: Normal range of motion. She exhibits edema.  Trace pitting edema on bilateral legs. Bilateral fingers slightly swollen.  Neurological: She is alert and oriented to person, place, and time. She has normal reflexes.  Skin: Skin is warm and dry.   Dilation: 1 Effacement (%): 50 Cervical Position: [place]: Ballotable Presentation: Undeterminable Exam by:: Shelly Morris RN Over 3 hrs  unchanged Third Trimester Ultrasound (8/24) Single uterine pregnancy Vertex position FHR: 143bpm AFI: 11.8 Posterior placenta  FHT: 135bpm, moderate variability, +accelerations, no decelerations UC: +3-72mn  Lab Results  Component Value Date   WBC 13.1* 08/18/2013   HGB 13.3 08/18/2013   HCT 37.6 08/18/2013   MCV 91.3 08/18/2013   PLT 196 08/18/2013   CMP     Component Value Date/Time   NA 135 08/18/2013 0850   K 3.8 08/18/2013 0850   CL 103 08/18/2013 0850   CO2 21 08/18/2013 0850   GLUCOSE 85 08/18/2013 0850   BUN 7 08/18/2013 0850   CREATININE 0.53 08/18/2013 0850   CREATININE 0.53 08/16/2013 1124   CALCIUM 9.5 08/18/2013 0850   PROT 6.3 08/18/2013 0850   ALBUMIN 2.7* 08/18/2013 0850   AST 21 08/18/2013 0850   ALT 26 08/18/2013 0850   ALKPHOS 159* 08/18/2013 0850   BILITOT 0.2* 08/18/2013 0850   GFRNONAA >90 08/18/2013 0850   GFRAA >90 08/18/2013 0850   Protein/creatinine: 0.31 (H)  MAU Course  Procedures  MDM - third trimester ultrasound - Cmet, CBC, UA, urine protein/creatinine to assess for preeclampsia - GBS swab (pt resfused test) - Wet prep (pt refused test)  Assessment and Plan  22yo G1 at 23w0dith A1 GDM and CHTN currently having regular contractions every 3-4 minutes.  #Preterm uterine contractions - painful and currently occuring every 3-4 minutes, cervix does not suggest current labor - Oral hydration - return for LOF, VB or worsening ctx. PTL precautions.  #Mild Preeclampsia/Chronic Hypertension - currently takes labetalol 10019mID. Blood pressure currently elevated. History of symptoms suggestive of pre-eclampsia. Protein-creatinine ratio elevated. Platelets normal. AST & ALT normal. - 24 hour urine protein to bring into clinic tomorrow [facility] - fasting CBGs 81-87. 2 hour CBGs sometimes reach 150-170s. - continue diet modification - continue CBGs   RalCordelia PocheD 08/18/2013, 9:12 AM   I spoke with and  examined patient and agree with resident's note and plan of care. I also discussed risks and benefits of GBS. Pt agreeable with GBS collection. MicFredrik RiggerD OB Fellow 08/18/2013 12:17 PM

## 2013-08-19 ENCOUNTER — Ambulatory Visit (INDEPENDENT_AMBULATORY_CARE_PROVIDER_SITE_OTHER): Payer: PRIVATE HEALTH INSURANCE | Admitting: Obstetrics & Gynecology

## 2013-08-19 ENCOUNTER — Encounter: Payer: Self-pay | Admitting: *Deleted

## 2013-08-19 VITALS — BP 132/95 | Temp 97.3°F | Wt 168.6 lb

## 2013-08-19 DIAGNOSIS — O0930 Supervision of pregnancy with insufficient antenatal care, unspecified trimester: Secondary | ICD-10-CM

## 2013-08-19 DIAGNOSIS — O0933 Supervision of pregnancy with insufficient antenatal care, third trimester: Secondary | ICD-10-CM

## 2013-08-19 LAB — CULTURE, OB URINE

## 2013-08-19 LAB — POCT URINALYSIS DIP (DEVICE)
Glucose, UA: NEGATIVE mg/dL
Nitrite: NEGATIVE
Urobilinogen, UA: 0.2 mg/dL (ref 0.0–1.0)

## 2013-08-19 NOTE — Progress Notes (Signed)
PTL was r/o yesterday, U/A was suspicious for UTI but culture not back yet. No dysuria, but [place]. Needs 24 hr urine, pr/cr was .61 yesterday. NST reactive BG FBS normal, pp up to 157

## 2013-08-19 NOTE — Patient Instructions (Signed)

## 2013-08-19 NOTE — Progress Notes (Signed)
Pt had visit to MAU yesterday for r/o labor.  She brought 24 urine jug to visit today but did not start collection until 2pm (due to being @ MAU) and voided last pm while not at home- collected specimen is not for complete 24 hrs.

## 2013-08-19 NOTE — Progress Notes (Signed)
Pulse- 87 Patient reports lower abdominal pain/pressure

## 2013-08-20 LAB — CULTURE, BETA STREP (GROUP B ONLY)

## 2013-08-23 ENCOUNTER — Ambulatory Visit (INDEPENDENT_AMBULATORY_CARE_PROVIDER_SITE_OTHER): Payer: PRIVATE HEALTH INSURANCE | Admitting: *Deleted

## 2013-08-23 VITALS — BP 133/95

## 2013-08-23 DIAGNOSIS — O10019 Pre-existing essential hypertension complicating pregnancy, unspecified trimester: Secondary | ICD-10-CM

## 2013-08-23 DIAGNOSIS — O10013 Pre-existing essential hypertension complicating pregnancy, third trimester: Secondary | ICD-10-CM

## 2013-08-23 LAB — COMPREHENSIVE METABOLIC PANEL
ALT: 19 U/L (ref 0–35)
CO2: 22 mEq/L (ref 19–32)
Calcium: 8.7 mg/dL (ref 8.4–10.5)
Chloride: 105 mEq/L (ref 96–112)
Potassium: 4 mEq/L (ref 3.5–5.3)
Sodium: 134 mEq/L — ABNORMAL LOW (ref 135–145)
Total Bilirubin: 0.2 mg/dL — ABNORMAL LOW (ref 0.3–1.2)
Total Protein: 6 g/dL (ref 6.0–8.3)

## 2013-08-23 LAB — CBC
Platelets: 191 10*3/uL (ref 150–400)
RBC: 3.99 MIL/uL (ref 3.87–5.11)
WBC: 8 10*3/uL (ref 4.0–10.5)

## 2013-08-23 NOTE — Progress Notes (Signed)
P = 76  Pt reports mild H/A x2 days- has not taken tylenol; denies visual disturbances.  Pt advised to try tylenol and if no relief or if becomes stronger, go to MAU.  Pt brought 24 hr urine today, labs drawn.

## 2013-08-25 LAB — CREATININE CLEARANCE, URINE, 24 HOUR
Creatinine Clearance: 154 mL/min — ABNORMAL HIGH (ref 75–115)
Creatinine, Urine: 65.4 mg/dL
Creatinine: 0.62 mg/dL (ref 0.50–1.10)

## 2013-08-27 ENCOUNTER — Other Ambulatory Visit: Payer: 59

## 2013-08-27 ENCOUNTER — Encounter: Payer: Self-pay | Admitting: Family Medicine

## 2013-08-27 ENCOUNTER — Encounter: Payer: Self-pay | Admitting: Obstetrics & Gynecology

## 2013-08-28 ENCOUNTER — Encounter: Payer: Self-pay | Admitting: *Deleted

## 2013-08-29 ENCOUNTER — Inpatient Hospital Stay (HOSPITAL_COMMUNITY): Payer: PRIVATE HEALTH INSURANCE

## 2013-08-29 ENCOUNTER — Ambulatory Visit (INDEPENDENT_AMBULATORY_CARE_PROVIDER_SITE_OTHER): Payer: PRIVATE HEALTH INSURANCE | Admitting: *Deleted

## 2013-08-29 ENCOUNTER — Inpatient Hospital Stay (HOSPITAL_COMMUNITY)
Admission: AD | Admit: 2013-08-29 | Discharge: 2013-09-03 | DRG: 774 | Disposition: A | Discharge status: Home / Self Care | Payer: PRIVATE HEALTH INSURANCE | Source: Ambulatory Visit | Attending: Family Medicine | Admitting: Family Medicine

## 2013-08-29 ENCOUNTER — Encounter (HOSPITAL_COMMUNITY): Payer: Self-pay | Admitting: *Deleted

## 2013-08-29 VITALS — BP 166/100 | Wt 171.5 lb

## 2013-08-29 DIAGNOSIS — O10019 Pre-existing essential hypertension complicating pregnancy, unspecified trimester: Secondary | ICD-10-CM

## 2013-08-29 DIAGNOSIS — O1493 Unspecified pre-eclampsia, third trimester: Secondary | ICD-10-CM

## 2013-08-29 DIAGNOSIS — O99814 Abnormal glucose complicating childbirth: Secondary | ICD-10-CM | POA: Diagnosis present

## 2013-08-29 DIAGNOSIS — I10 Essential (primary) hypertension: Secondary | ICD-10-CM | POA: Diagnosis present

## 2013-08-29 DIAGNOSIS — R87629 Unspecified abnormal cytological findings in specimens from vagina: Secondary | ICD-10-CM

## 2013-08-29 DIAGNOSIS — O99810 Abnormal glucose complicating pregnancy: Secondary | ICD-10-CM

## 2013-08-29 DIAGNOSIS — IMO0002 Reserved for concepts with insufficient information to code with codable children: Principal | ICD-10-CM | POA: Diagnosis present

## 2013-08-29 DIAGNOSIS — O10013 Pre-existing essential hypertension complicating pregnancy, third trimester: Secondary | ICD-10-CM

## 2013-08-29 DIAGNOSIS — O219 Vomiting of pregnancy, unspecified: Secondary | ICD-10-CM

## 2013-08-29 LAB — PROTEIN / CREATININE RATIO, URINE
Protein Creatinine Ratio: 1.11 — ABNORMAL HIGH (ref 0.00–0.15)
Total Protein, Urine: 30.8 mg/dL

## 2013-08-29 LAB — COMPREHENSIVE METABOLIC PANEL
ALT: 17 U/L (ref 0–35)
Calcium: 9.6 mg/dL (ref 8.4–10.5)
GFR calc Af Amer: >90 mL/min (ref >90)
Glucose, Bld: 78 mg/dL (ref 70–99)
Sodium: 135 mEq/L (ref 135–145)
Total Protein: 6.3 g/dL (ref 6.0–8.3)

## 2013-08-29 LAB — CBC
HCT: 37.8 % (ref 36.0–46.0)
Hemoglobin: 13.5 g/dL (ref 12.0–15.0)
MCHC: 35.7 g/dL (ref 30.0–36.0)

## 2013-08-29 MED ORDER — OXYCODONE-ACETAMINOPHEN 5-325 MG PO TABS
1.0000 | ORAL_TABLET | ORAL | Status: DC | PRN
  Administered 2013-08-30 (×2): 1 via ORAL
  Filled 2013-08-29 (×3): qty 1

## 2013-08-29 MED ORDER — LIDOCAINE HCL (PF) 1 % IJ SOLN
30.0000 mL | INTRAMUSCULAR | Status: AC | PRN
  Administered 2013-09-01: 30 mL via SUBCUTANEOUS
  Filled 2013-08-29 (×2): qty 30

## 2013-08-29 MED ORDER — ONDANSETRON HCL 4 MG/2ML IJ SOLN: 4.0000 mg | Freq: Four times a day (QID) | INTRAMUSCULAR | Status: DC | PRN

## 2013-08-29 MED ORDER — IBUPROFEN 600 MG PO TABS
600.0000 mg | ORAL_TABLET | Freq: Four times a day (QID) | ORAL | Status: DC | PRN
  Filled 2013-08-29: qty 1

## 2013-08-29 MED ORDER — ZOLPIDEM TARTRATE 5 MG PO TABS
5.0000 mg | ORAL_TABLET | Freq: Every evening | ORAL | Status: DC | PRN
  Administered 2013-08-29: 5 mg via ORAL
  Filled 2013-08-29: qty 1

## 2013-08-29 MED ORDER — ACETAMINOPHEN 325 MG PO TABS
650.0000 mg | ORAL_TABLET | ORAL | Status: DC | PRN
  Administered 2013-08-30: 650 mg via ORAL
  Filled 2013-08-29: qty 2

## 2013-08-29 MED ORDER — LACTATED RINGERS IV SOLN: 500.0000 mL | INTRAVENOUS | Status: DC | PRN

## 2013-08-29 MED ORDER — TERBUTALINE SULFATE 1 MG/ML IJ SOLN: 0.2500 mg | Freq: Once | INTRAMUSCULAR | Status: AC | PRN

## 2013-08-29 MED ORDER — ALBUTEROL SULFATE HFA 108 (90 BASE) MCG/ACT IN AERS: 1.0000 | INHALATION_SPRAY | Freq: Four times a day (QID) | RESPIRATORY_TRACT | Status: DC | PRN

## 2013-08-29 MED ORDER — ZOLPIDEM TARTRATE 5 MG PO TABS: 5.0000 mg | ORAL_TABLET | Freq: Every evening | ORAL | Status: DC | PRN

## 2013-08-29 MED ORDER — OXYTOCIN 40 UNITS IN LACTATED RINGERS INFUSION - SIMPLE MED: 62.5000 mL/h | INTRAVENOUS | Status: DC

## 2013-08-29 MED ORDER — ACETAMINOPHEN 500 MG PO TABS
1000.0000 mg | ORAL_TABLET | Freq: Once | ORAL | Status: AC
  Administered 2013-08-29: 1000 mg via ORAL
  Filled 2013-08-29: qty 2

## 2013-08-29 MED ORDER — OXYTOCIN BOLUS FROM INFUSION
500.0000 mL | INTRAVENOUS | Status: DC
  Administered 2013-09-01: 500 mL via INTRAVENOUS

## 2013-08-29 MED ORDER — MISOPROSTOL 25 MCG QUARTER TABLET
25.0000 ug | ORAL_TABLET | ORAL | Status: DC | PRN
  Administered 2013-08-29 – 2013-08-30 (×3): 25 ug via VAGINAL
  Filled 2013-08-29: qty 0.25
  Filled 2013-08-29: qty 1
  Filled 2013-08-29 (×2): qty 0.25

## 2013-08-29 MED ORDER — CITRIC ACID-SODIUM CITRATE 334-500 MG/5ML PO SOLN: 30.0000 mL | ORAL | Status: DC | PRN

## 2013-08-29 MED ORDER — LABETALOL HCL 100 MG PO TABS
100.0000 mg | ORAL_TABLET | Freq: Two times a day (BID) | ORAL | Status: DC
  Administered 2013-08-29 – 2013-09-01 (×6): 100 mg via ORAL
  Filled 2013-08-29 (×8): qty 1

## 2013-08-29 MED ORDER — LACTATED RINGERS IV SOLN
INTRAVENOUS | Status: DC
  Administered 2013-08-29 – 2013-08-31 (×4): via INTRAVENOUS
  Administered 2013-09-01: 80 mL/h via INTRAVENOUS
  Administered 2013-09-01: 01:00:00 via INTRAVENOUS

## 2013-08-29 MED ORDER — MAGNESIUM SULFATE BOLUS VIA INFUSION
4.0000 g | Freq: Once | INTRAVENOUS | Status: AC
  Administered 2013-08-29: 4 g via INTRAVENOUS
  Filled 2013-08-29: qty 500

## 2013-08-29 MED ORDER — FENTANYL CITRATE 0.05 MG/ML IJ SOLN: 100.0000 ug | INTRAMUSCULAR | Status: DC | PRN

## 2013-08-29 MED ORDER — MAGNESIUM SULFATE 40 G IN LACTATED RINGERS - SIMPLE
2.0000 g/h | INTRAVENOUS | Status: AC
  Administered 2013-08-30 – 2013-09-01 (×3): 2 g/h via INTRAVENOUS
  Filled 2013-08-29 (×4): qty 500

## 2013-08-29 MED ORDER — MAGNESIUM SULFATE BOLUS VIA INFUSION
6.0000 g | Freq: Once | INTRAVENOUS | Status: DC
  Filled 2013-08-29: qty 500

## 2013-08-29 NOTE — MAU Note (Signed)
Patient was seen in the [facility] today and sent to MAU for evaluation.

## 2013-08-29 NOTE — MAU Note (Addendum)
Patient sent to MAU from clinic for blood pressure evaluation. States has headache rating 6/10; denies headache nor blurry vision. Denies epigastric pain. Reports good fetal movement. Denies LOF, bleeding; states is having some contractions. Patients states "something happened with baby's heart rate and they had to put oxygen on me."

## 2013-08-29 NOTE — Progress Notes (Addendum)
P - 116    While applying EFM for NST, FHR was audibly slow. Pt was turned to Lt side and FHR remained slow but rate did not show on EFM screen. Pt was then turned to Rt side, HOB lowered, O2 applied @ 8l/min per non re-breather mask and FHR was visualized with US abdominal probe. The rate appeared to increase and decrease. PW doppler measurement was 116 bpm. EFM was applied @ 1113 and FHR was in the 120's. Pt remained on Rt side with O2 @ 8l/min and no FHR decels during NST (1 hr).  Pt states she is still having H/A's and they are getting worse.  24 hr urine reviewed from 8/29- total protein = 231.  Report given to Dr. Roselie Awkward- pt to go to MAU for further evaluation.

## 2013-08-29 NOTE — Progress Notes (Signed)
NST 08/29/13 reactive

## 2013-08-29 NOTE — Progress Notes (Signed)
   Shelly Adams is a 22 y.o. G1P0 at [redacted]w[redacted]d admitted for induction of labor due to Pre-eclamptic toxemia of pregnancy..  Subjective: Denies HA, RUQ pain, visual changes  Objective: BP 145/70  Pulse 90  Temp(Src) 98.1 F (36.7 C) (Oral)  Resp 20  Ht 4' 11"  (1.499 m)  Wt 78.019 kg (172 lb)  BMI 34.72 kg/m2  SpO2 100%  LMP 11/21/2012    FHT:  FHR: 150 bpm, variability: moderate,  accelerations:  Present,  decelerations:  Absent UC:   none SVE: 1/20/-2      Labs: Lab Results  Component Value Date   WBC 10.0 08/29/2013   HGB 13.5 08/29/2013   HCT 37.8 08/29/2013   MCV 91.3 08/29/2013   PLT 169 08/29/2013    Assessment / Plan: IOL for preeclampsia, ripening phase 1st cytotec placed MgSO4 infusing at 2gm/hr Labor: no Fetal Wellbeing:  Category I Pain Control:  Labor support without medications Anticipated MOD:  NSVD  CRESENZO-DISHMAN,Ranell Finelli 08/29/2013, 7:59 PM

## 2013-08-29 NOTE — Progress Notes (Signed)
Shelly Adams  was seen today for an ultrasound appointment.  See full report in [facility].  Impression: Single IUP at 36 4/7 weeks A1 GDM, suspected superimposed preeclampsia Fetal growth is appropriate (38th %tile) Normal amniotic fluid volume  Recommendations: Follow-up ultrasounds as clinically indicated.   Benjaman Lobe, MD

## 2013-08-29 NOTE — H&P (Signed)
Shelly Adams is a 22 y.o. G1P0 at 39w4dpresents from clinic for evaluation of blood pressure in clinic. Pt has been dx with GHTN currently on labetalol 1077mBID. Pt was recently evaluated for PreEclampsia with Pro:Cr 0.31 and 24hr urine <300. Pt remainder labs were within normal limits. Pt in clinic complained of headaches that she has been having on and off. Pt states no vision changes, RUQ pain, but is persistently swollen and not wearing rings at this time.  Maternal Medical History:  Reason for admission: Nausea.    OB History   Grav Para Term Preterm Abortions TAB SAB Ect Mult Living   1              Past Medical History  Diagnosis Date  . Hyperlipemia   . Hypertension   . Recurrent kidney stones   . Insomnia   . Anxiety   . Gestational diabetes    Past Surgical History  Procedure Laterality Date  . Breast reduction surgery  2009   Family History: family history includes GER disease in her mother; Hyperlipidemia in her father and mother; Hypertension in her father and mother. Social History:  reports that she has never smoked. She has never used smokeless tobacco. She reports that she does not drink alcohol or use illicit drugs.   Prenatal Transfer Tool  Maternal Diabetes: No Genetic Screening: Normal Maternal Ultrasounds/Referrals: Normal Fetal Ultrasounds or other Referrals:  None Maternal Substance Abuse:  No Significant Maternal Medications:  Meds include: Other: labetalol 10074mID Significant Maternal Lab Results:  Lab values include: Group B Strep negative, Other: +Pr:Cr Other Comments:  Preeclampsia  Review of Systems  Constitutional: Negative for fever and chills.  Eyes: Negative for blurred vision, double vision, photophobia and pain.  Cardiovascular: Negative for chest pain, palpitations and orthopnea.  Gastrointestinal: Negative for heartburn, nausea, vomiting, abdominal pain, diarrhea and constipation.  Genitourinary: Negative for dysuria, urgency and  frequency.  Neurological: Positive for headaches.    Dilation: 1 Effacement (%): 20;30 Station: -3 Exam by:: Dr. OdoLeslie Andreaood pressure 151/97, pulse 85, temperature 97.9 F (36.6 C), temperature source Oral, resp. rate 18, height 5' (1.524 m), weight 78.019 kg (172 lb), last menstrual period 11/21/2012, SpO2 100.00%. Exam Physical Exam  Constitutional: She is oriented to person, place, and time. She appears well-developed and well-nourished. No distress.  HENT:  Head: Normocephalic and atraumatic.  Eyes: Conjunctivae are normal.  Neck: Normal range of motion. Neck supple.  Cardiovascular: Normal rate, regular rhythm, normal heart sounds and intact distal pulses.  Exam reveals no gallop and no friction rub.   No murmur heard. Respiratory: Effort normal and breath sounds normal. No respiratory distress. She has no wheezes. She has no rales. She exhibits no tenderness.  GI: Soft. Bowel sounds are normal. She exhibits no distension (gravid 2800g) and no mass. There is no tenderness. There is no rebound and no guarding.  Genitourinary: Vagina normal.  Musculoskeletal: Normal range of motion. She exhibits edema. She exhibits no tenderness.  Neurological: She is alert and oriented to person, place, and time. She has normal reflexes.  Skin: She is not diaphoretic.  Psychiatric: She has a normal mood and affect. Her behavior is normal. Judgment and thought content normal.    Prenatal labs: ABO, Rh: O/Positive/-- (05/09 0000) Antibody: Negative (05/09 0000) Rubella: Immune (05/09 0000) RPR: Nonreactive, Nonreactive (05/09 0000)  HBsAg: Negative (05/09 0000)  HIV: Non-reactive, Non-reactive (05/09 0000)  GBS: Negative (08/24 0000)   FHT: 130s mod variability mult  accels, no decels Toco: intermittent ctx    Results for orders placed during the hospital encounter of 08/29/13 (from the past 24 hour(s))  PROTEIN / CREATININE RATIO, URINE   Collection Time    08/29/13 12:20 PM      Result  Value Range   Creatinine, Urine 27.86     Total Protein, Urine 30.8     PROTEIN CREATININE RATIO 1.11 (*) 0.00 - 0.15  COMPREHENSIVE METABOLIC PANEL   Collection Time    08/29/13 12:50 PM      Result Value Range   Sodium 135  135 - 145 mEq/L   Potassium 4.2  3.5 - 5.1 mEq/L   Chloride 103  96 - 112 mEq/L   CO2 21  19 - 32 mEq/L   Glucose, Bld 78  70 - 99 mg/dL   BUN 7  6 - 23 mg/dL   Creatinine, Ser 0.57  0.50 - 1.10 mg/dL   Calcium 9.6  8.4 - 10.5 mg/dL   Total Protein 6.3  6.0 - 8.3 g/dL   Albumin 2.8 (*) 3.5 - 5.2 g/dL   AST 19  0 - 37 U/L   ALT 17  0 - 35 U/L   Alkaline Phosphatase 159 (*) 39 - 117 U/L   Total Bilirubin 0.1 (*) 0.3 - 1.2 mg/dL   GFR calc non Af Amer >90  >90 mL/min   GFR calc Af Amer >90  >90 mL/min  CBC   Collection Time    08/29/13 12:50 PM      Result Value Range   WBC 10.0  4.0 - 10.5 K/uL   RBC 4.14  3.87 - 5.11 MIL/uL   Hemoglobin 13.5  12.0 - 15.0 g/dL   HCT 37.8  36.0 - 46.0 %   MCV 91.3  78.0 - 100.0 fL   MCH 32.6  26.0 - 34.0 pg   MCHC 35.7  30.0 - 36.0 g/dL   RDW 13.4  11.5 - 15.5 %   Platelets 169  150 - 400 K/uL    Assessment/Plan: Shelly Adams is a 22 y.o. G1P0 at 23w4dhere with superimposed preeclampsia based on pressures and pr:Cr =1.1 #Labor: IOL for HTN. Bishop score of 2. Start with PV Cytotec 275m, consider foley bulb PRN #Pain: Desires iv or pain meds #FWB: Cat I #ID: GBS Neg #Preeclampsia - severe range BPs - start magnesium #MOF: Breast #MOC: ?Mirena  Silverio Hagan RYAN 08/29/2013, 5:07 PM

## 2013-08-29 NOTE — MAU Provider Note (Signed)
History     CSN: [id number]  Arrival date and time: 08/29/13 1216   First Provider Initiated Contact with Patient 08/29/13 1301      Chief Complaint  Patient presents with  . Hypertension   HPI Shelly Adams is a 22 y.o. G1P0 at 32w4dpresents from clinic for evaluation of blood pressure in clinic. Pt has been dx with GHTN currently on labetalol 1058mBID. Pt was recently evaluated for PreEclampsia with Pro:Cr 0.31 and 24hr urine <300. Pt remainder labs were within normal limits. Pt in clinic complained of headaches that she has been having on and off. Pt states no vision changes, RUQ pain, but is persistently swollen and not wearing rings at this time.  +FM, no LOF, no Vb, no Ctx  Otherwise no complaints   OB History   Grav Para Term Preterm Abortions TAB SAB Ect Mult Living   1               Past Medical History  Diagnosis Date  . Hyperlipemia   . Hypertension   . Recurrent kidney stones   . Insomnia   . Anxiety   . Gestational diabetes     Past Surgical History  Procedure Laterality Date  . Breast reduction surgery  2009    Family History  Problem Relation Age of Onset  . Hypertension Mother   . Hyperlipidemia Mother   . GER disease Mother   . Hyperlipidemia Father   . Hypertension Father     History  Substance Use Topics  . Smoking status: Never Smoker   . Smokeless tobacco: Never Used  . Alcohol Use: No    Allergies: No Known Allergies  Facility-administered medications prior to admission  Medication Dose Route Frequency Provider Last Rate Last Dose  . TDaP (BOOSTRIX) injection 0.5 mL  0.5 mL Intramuscular Once JaWoodroe ModeMD       Prescriptions prior to admission  Medication Sig Dispense Refill  . albuterol (PROVENTIL HFA;VENTOLIN HFA) 108 (90 BASE) MCG/ACT inhaler Inhale 1 puff into the lungs every 6 (six) hours as needed for wheezing.      . Marland Kitchenabetalol (NORMODYNE) 100 MG tablet Take 1 tablet (100 mg total) by mouth 2 (two) times daily.  60  tablet  2  . Omega-3 Fatty Acids (FISH OIL PO) Take 200 mg by mouth daily.      . Prenatal Vit-Fe Fumarate-FA (PRENATAL MULTIVITAMIN) TABS tablet Take 1 tablet by mouth daily at 12 noon.      . Marland Kitchenlucose blood (BL TEST STRIP PACK) test strip Use as instructed  100 each  12    ROS Physical Exam   Blood pressure 157/85, pulse 79, temperature 97.1 F (36.2 C), temperature source Oral, resp. rate 18, height 5' (1.524 m), weight 78.019 kg (172 lb), last menstrual period 11/21/2012, SpO2 100.00%. Filed Vitals:   08/29/13 1358 08/29/13 1401 08/29/13 1415 08/29/13 1430  BP:  157/87 153/98 157/85  Pulse: 73 74 71 79  Temp:      TempSrc:      Resp:      Height:      Weight:      SpO2: 99%  99% 100%    Physical Exam   CBC    Component Value Date/Time   WBC 10.0 08/29/2013 1250   RBC 4.14 08/29/2013 1250   HGB 13.5 08/29/2013 1250   HGB 38.8 05/03/2013   HCT 37.8 08/29/2013 1250   HCT 13 05/03/2013   PLT 169 08/29/2013 1250  MCV 91.3 08/29/2013 1250   MCH 32.6 08/29/2013 1250   MCHC 35.7 08/29/2013 1250   RDW 13.4 08/29/2013 1250   LYMPHSABS 3.7 06/21/2010 2323   MONOABS 0.8 06/21/2010 2323   EOSABS 1.1* 06/21/2010 2323   BASOSABS 0.1 06/21/2010 2323   CMP     Component Value Date/Time   NA 135 08/29/2013 1250   K 4.2 08/29/2013 1250   CL 103 08/29/2013 1250   CO2 21 08/29/2013 1250   GLUCOSE 78 08/29/2013 1250   BUN 7 08/29/2013 1250   CREATININE 0.57 08/29/2013 1250   CREATININE 0.62 08/23/2013 1148   CREATININE 0.62 08/23/2013 1147   CALCIUM 9.6 08/29/2013 1250   PROT 6.3 08/29/2013 1250   ALBUMIN 2.8* 08/29/2013 1250   AST 19 08/29/2013 1250   ALT 17 08/29/2013 1250   ALKPHOS 159* 08/29/2013 1250   BILITOT 0.1* 08/29/2013 1250   GFRNONAA >90 08/29/2013 1250   GFRAA >90 08/29/2013 1250       MAU Course  Procedures  MDM Pt given 1015m APAP for headache and headache has not resolved. Pt BPs persistently elevated with 1 BP in severe range with SBP in 170s. CBC and CMP WNL. Pr:cr 1.1.    Assessment and Plan   Shelly Baltzis a 22y.o. G1P0 at 348w4d/ GHTN vs mild preeclampsia. Labs with worsening pr:Cr ration sugestion of preeclampsia with severe range blood pressure inspite of labetalol 10037mID. Will admit for IOL.  ODOFredrik Rigger4/2014, 2:42 PM

## 2013-08-30 LAB — ABO/RH: ABO/RH(D): O POS

## 2013-08-30 LAB — TYPE AND SCREEN: ABO/RH(D): O POS

## 2013-08-30 LAB — RPR: RPR Ser Ql: NONREACTIVE

## 2013-08-30 MED ORDER — DIPHENHYDRAMINE HCL 25 MG PO CAPS
50.0000 mg | ORAL_CAPSULE | Freq: Every evening | ORAL | Status: DC | PRN
  Administered 2013-08-30: 50 mg via ORAL
  Filled 2013-08-30: qty 2

## 2013-08-30 NOTE — Progress Notes (Signed)
   Shelly Adams is a 22 y.o. G1P0 at [redacted]w[redacted]d admitted for induction of labor due to Pre-eclamptic toxemia of pregnancy..  Subjective: Feeling crampy.  C/O HA  Objective: BP 141/89  Pulse 84  Temp(Src) 98.1 F (36.7 C) (Oral)  Resp 20  Ht 4' 11"  (1.499 m)  Wt 78.019 kg (172 lb)  BMI 34.72 kg/m2  SpO2 98%  LMP 11/21/2012 Total I/O In: 2082.1 [P.O.:1080; I.V.:1002.1] Out: 2750 [Urine:2750]  FHT:  FHR: 150 bpm, variability: moderate,  accelerations:  Present,  decelerations:  Absent UC:   rare SVE:   Dilation: 1.5 Effacement (%): 30 Station: -3 Exam by:: VLaurin Coder RN   Labs: Lab Results  Component Value Date   WBC 10.0 08/29/2013   HGB 13.5 08/29/2013   HCT 37.8 08/29/2013   MCV 91.3 08/29/2013   PLT 169 08/29/2013    Assessment / Plan: IOL for PReeclampsia, ripening phase 3rd cytotec placed PV; percocet for HA Labor: no Fetal Wellbeing:  Category I Pain Control:  Labor support without medications Anticipated MOD:  NSVD  Adams,Shelly Boyett 08/30/2013, 5:01 AM

## 2013-08-30 NOTE — Progress Notes (Signed)
  Shelly Adams is a 22 y.o. G1P0 at [redacted]w[redacted]d admitted for induction of labor due to Chronic Hypertension with Superimposed Severe Preeclampsia.  Subjective: Reports continued 4/10 headache, just took Percocet. Denies visual symptoms and abdominal pain. Comfortable, declining any labor analgesia for now.  Objective: BP 168/98  Pulse 98  Temp(Src) 98 F (36.7 C) (Oral)  Resp 18  Ht 4' 11"  (1.499 m)  Wt 172 lb (78.019 kg)  BMI 34.72 kg/m2  SpO2 98%  LMP 11/21/2012 Total I/O In: 500 [I.V.:500] Out: 800 [Urine:800] Lungs: CTAB Heart: RRR Ext: 3+ DTRs, 1 beat of clonus, 1+ edema BLE  FHT:  FHR: 120 bpm, variability: moderate,  accelerations:  Present,  decelerations:  Absent UC:   rare SVE:   Dilation: 1 Effacement (%): 70 Station: -2 Exam by:: dr. aHarolyn RutherfordFoley bulb placed and balloon filled with 60 cc of LR  Labs: Lab Results  Component Value Date   WBC 10.0 08/29/2013   HGB 13.5 08/29/2013   HCT 37.8 08/29/2013   MCV 91.3 08/29/2013   PLT 169 08/29/2013    Assessment / Plan: Induction of labor due to Chronic Hypertension with Superimposed Severe Preeclampsia at 35w5dLabor: Foley bulb in place Preeclampsia: On magnesium sulfate, no signs of toxicity.  Antihypertensives as needed. Fetal Wellbeing:  Category I Pain Control:  Labor support without medications for now Anticipated MOD:  NSVD  Alcus Bradly A, M.D. 08/30/2013, 10:36 AM

## 2013-08-31 ENCOUNTER — Inpatient Hospital Stay (HOSPITAL_COMMUNITY): Payer: PRIVATE HEALTH INSURANCE | Admitting: Anesthesiology

## 2013-08-31 ENCOUNTER — Encounter (HOSPITAL_COMMUNITY): Payer: Self-pay | Admitting: Anesthesiology

## 2013-08-31 LAB — CBC
HCT: 39.2 % (ref 36.0–46.0)
Hemoglobin: 13.9 g/dL (ref 12.0–15.0)
MCV: 91.8 fL (ref 78.0–100.0)
RBC: 4.27 MIL/uL (ref 3.87–5.11)
WBC: 13.1 10*3/uL — ABNORMAL HIGH (ref 4.0–10.5)

## 2013-08-31 MED ORDER — LABETALOL HCL 5 MG/ML IV SOLN
20.0000 mg | INTRAVENOUS | Status: DC | PRN
  Administered 2013-08-31: 20 mg via INTRAVENOUS
  Administered 2013-09-01: 40 mg via INTRAVENOUS
  Administered 2013-09-01 (×2): 20 mg via INTRAVENOUS
  Filled 2013-08-31: qty 8
  Filled 2013-08-31 (×2): qty 4

## 2013-08-31 MED ORDER — EPHEDRINE 5 MG/ML INJ
10.0000 mg | INTRAVENOUS | Status: DC | PRN
  Filled 2013-08-31: qty 4
  Filled 2013-08-31: qty 2

## 2013-08-31 MED ORDER — OXYTOCIN 40 UNITS IN LACTATED RINGERS INFUSION - SIMPLE MED
1.0000 m[IU]/min | INTRAVENOUS | Status: DC
  Filled 2013-08-31: qty 1000

## 2013-08-31 MED ORDER — EPHEDRINE 5 MG/ML INJ
10.0000 mg | INTRAVENOUS | Status: DC | PRN
  Filled 2013-08-31: qty 2

## 2013-08-31 MED ORDER — OXYTOCIN 40 UNITS IN LACTATED RINGERS INFUSION - SIMPLE MED
1.0000 m[IU]/min | INTRAVENOUS | Status: DC
  Administered 2013-08-31: 2 m[IU]/min via INTRAVENOUS
  Administered 2013-08-31: 12 m[IU]/min via INTRAVENOUS
  Administered 2013-09-01: 8 m[IU]/min via INTRAVENOUS
  Administered 2013-09-01: 10 m[IU]/min via INTRAVENOUS
  Filled 2013-08-31: qty 1000

## 2013-08-31 MED ORDER — ZOLPIDEM TARTRATE 5 MG PO TABS: 5.0000 mg | ORAL_TABLET | Freq: Every evening | ORAL | Status: DC | PRN

## 2013-08-31 MED ORDER — PHENYLEPHRINE 40 MCG/ML (10ML) SYRINGE FOR IV PUSH (FOR BLOOD PRESSURE SUPPORT)
80.0000 ug | PREFILLED_SYRINGE | INTRAVENOUS | Status: DC | PRN
  Filled 2013-08-31: qty 2

## 2013-08-31 MED ORDER — LABETALOL HCL 5 MG/ML IV SOLN
INTRAVENOUS | Status: AC
  Administered 2013-08-31: 20 mg via INTRAVENOUS
  Filled 2013-08-31: qty 4

## 2013-08-31 MED ORDER — FENTANYL 2.5 MCG/ML BUPIVACAINE 1/10 % EPIDURAL INFUSION (WH - ANES)
INTRAMUSCULAR | Status: DC | PRN
  Administered 2013-08-31: 14 mL/h via EPIDURAL

## 2013-08-31 MED ORDER — FENTANYL 2.5 MCG/ML BUPIVACAINE 1/10 % EPIDURAL INFUSION (WH - ANES)
14.0000 mL/h | INTRAMUSCULAR | Status: DC | PRN
  Administered 2013-09-01 (×2): 14 mL/h via EPIDURAL
  Filled 2013-08-31 (×3): qty 125

## 2013-08-31 MED ORDER — LIDOCAINE HCL (PF) 1 % IJ SOLN
INTRAMUSCULAR | Status: DC | PRN
  Administered 2013-08-31 (×2): 9 mL

## 2013-08-31 MED ORDER — PHENYLEPHRINE 40 MCG/ML (10ML) SYRINGE FOR IV PUSH (FOR BLOOD PRESSURE SUPPORT)
80.0000 ug | PREFILLED_SYRINGE | INTRAVENOUS | Status: DC | PRN
  Filled 2013-08-31: qty 2
  Filled 2013-08-31: qty 5

## 2013-08-31 MED ORDER — TERBUTALINE SULFATE 1 MG/ML IJ SOLN: 0.2500 mg | Freq: Once | INTRAMUSCULAR | Status: AC | PRN

## 2013-08-31 MED ORDER — LACTATED RINGERS IV SOLN
500.0000 mL | Freq: Once | INTRAVENOUS | Status: AC
  Administered 2013-08-31: 500 mL via INTRAVENOUS

## 2013-08-31 MED ORDER — DIPHENHYDRAMINE HCL 50 MG/ML IJ SOLN: 12.5000 mg | INTRAMUSCULAR | Status: DC | PRN

## 2013-08-31 NOTE — Progress Notes (Signed)
Addisyn Leclaire is a 22 y.o. G1P0 at 52w6dadmitted for induction of labor due to [facility]imposed w/ severe pre-e. Also is A1DM, EFW 38% 2d ago  Subjective: Foley bulb fell out when she got up to void. Has eaten breakfast and showered.  Denies ha, scotomata, ruq/epigastric pain, n/v.    Objective: BP 163/100  Pulse 87  Temp(Src) 98.3 F (36.8 C) (Oral)  Resp 20  Ht 4' 11"  (1.499 m)  Wt 78.019 kg (172 lb)  BMI 34.72 kg/m2  SpO2 98%  LMP 11/21/2012 I/O last 3 completed shifts: In: 7865.9 [P.O.:3570; I.V.:4295.9] Out: 8300 [Urine:8300] Total I/O In: 650 [P.O.:400; I.V.:250] Out: 200 [Urine:200]  FHT:  FHR: 135 bpm, variability: moderate,  accelerations:  Present,  decelerations:  Absent UC:   Mild, irregular SVE:   Dilation: 5 Effacement (%): 50 Station: -2;-3 Exam by:: K Carinna Newhart CNM  DTRs 3+, no clonus, 2+ BLE edema CBGs 105, 115  Labs: Lab Results  Component Value Date   WBC 10.0 08/29/2013   HGB 13.5 08/29/2013   HCT 37.8 08/29/2013   MCV 91.3 08/29/2013   PLT 169 08/29/2013    Assessment / Plan: IOL d/t CHTN w/ superimposed severe pre-e, foley bulb now out. Will start pitocin per protocol. Labetalol per protocol for severe range BPs CBGs q 4hr until active labor, then q 2hr  Labor: n/a Preeclampsia:  on magnesium sulfate and no signs or symptoms of toxicity Fetal Wellbeing:  Category I Pain Control:  n/a I/D:  n/a Anticipated MOD:  NSVD  BTawnya Crook9/05/2013, 9:36 AM

## 2013-08-31 NOTE — Progress Notes (Signed)
Shelly Adams is a 22 y.o. G1P0 at 44w6dadmitted for induction of labor due to [facility]w/ superimposed severe pre-e. Also has A1DM  Subjective: Feeling uncomfortable w/ uc's, requests sve  Standing at bs, rocking, drinking orange julius milkshake/slushy. RN states she was also eating chips/salsa earlier. Reiterated importance/reason for clear liquid diet, and appropriate clear liquid choices. Denies ha, scotomata, ruq/epigastric pain, n/v.    Objective: BP 157/91  Pulse 84  Temp(Src) 98.4 F (36.9 C) (Oral)  Resp 20  Ht 4' 11"  (1.499 m)  Wt 78.019 kg (172 lb)  BMI 34.72 kg/m2  SpO2 98%  LMP 11/21/2012 I/O last 3 completed shifts: In: 8175.3 [P.O.:3660; I.V.:4515.3] Out: 6550 [Urine:6550]   FHT:  FHR: 130 bpm, variability: moderate,  accelerations:  Present,  decelerations:  Absent UC:   regular, every 2-3 minutes SVE:   Dilation: 4.5 Effacement (%): 80 Station: -2 Exam by:: KDoree FudgeCNM BBOW  Labs: Lab Results  Component Value Date   WBC 10.0 08/29/2013   HGB 13.5 08/29/2013   HCT 37.8 08/29/2013   MCV 91.3 08/29/2013   PLT 169 08/29/2013    Assessment / Plan: IOL d/t CHTN w/ superimposed severe pre-e, also A1DM. Pitocin at 252mmin, pt uncomfortable, cervix thinning out some, and now bbow felt. Will wait to arom d/t pt drinking/eating solids Encouraged birthing ball/frequent position changes RN notified of pt drinking milkshake/slushy  Labor: Progressing on Pitocin, will continue to increase then AROM Preeclampsia:  on magnesium sulfate and no signs or symptoms of toxicity Fetal Wellbeing:  Category I Pain Control:  Labor support without medications I/D:  n/a Anticipated MOD:  NSVD  BoTawnya Crook/05/2013, 7:51 PM

## 2013-08-31 NOTE — Progress Notes (Signed)
Shelly Adams is a 22 y.o. G1P0 at 55w6dadmitted for induction of labor [facility] to [facility] superimposed severe pre-e. Also has A1DM  Subjective: Beginning to feel uncomfortable w/ uc's Denies ha, scotomata, ruq/epigastric pain, n/v.    Objective: BP 150/65  Pulse 77  Temp(Src) 98.3 F (36.8 C) (Oral)  Resp 18  Ht 4' 11"  (1.499 m)  Wt 78.019 kg (172 lb)  BMI 34.72 kg/m2  SpO2 98%  LMP 11/21/2012 I/O last 3 completed shifts: In: 7865.9 [P.O.:3570; I.V.:4295.9] Out: 8300 [Urine:8300] Total I/O In: 1412.5 [P.O.:610; I.V.:802.5] Out: 1550 [Urine:1550]   Recent Labs  08/31/13 0919 08/31/13 1209  GLUCAP 115* 80     FHT:  FHR: 125 bpm, variability: moderate,  accelerations:  Present,  decelerations:  Absent UC:   regular, every 2-5 minutes SVE:   Dilation: 4.5 Effacement (%): 70 Station: -2 Exam by:: Shelly Adams, cnm  Labs: Lab Results  Component Value Date   WBC 10.0 08/29/2013   HGB 13.5 08/29/2013   HCT 37.8 08/29/2013   MCV 91.3 08/29/2013   PLT 169 08/29/2013    Assessment / Plan: IOL d/t CHTN w/ superimposed severe pre-e. Pitocin at 165mmin for past 3hrs. RN to continue increasing per protocol to achieve adequate labor Has required 1 dose of iv labetalol  Labor: not yet Preeclampsia:  on magnesium sulfate and no signs or symptoms of toxicity Fetal Wellbeing:  Category I Pain Control:  Labor support without medications I/D:  n/a Anticipated MOD:  NSVD  BoTawnya Adams/05/2013, 3:16 PM

## 2013-08-31 NOTE — Progress Notes (Signed)
Shelly Adams is a 22 y.o. G1P0 at 63w6dadmitted for induction of labor [facility] severe pre-e.  Also has A1DM.  Subjective: Comfortable w/ epidural. SROM'd @ 2013, clear fluid Denies ha, scotomata, ruq/epigastric pain, n/v.    Objective: BP 145/87  Pulse 83  Temp(Src) 98.2 F (36.8 C) (Oral)  Resp 20  Ht 4' 11"  (1.499 m)  Wt 78.019 kg (172 lb)  BMI 34.72 kg/m2  SpO2 99%  LMP 11/21/2012 I/O last 3 completed shifts: In: 8175.3 [P.O.:3660; I.V.:4515.3] Out: 63846[Urine:6550] Total I/O In: 1271.8 [P.O.:320; I.V.:951.8] Out: 700 [Urine:700]  CBG (last 3)   Recent Labs  08/31/13 1209 08/31/13 1738 08/31/13 2152  GLUCAP 80 110* 108*     FHT:  FHR: 125 bpm, variability: moderate,  accelerations:  Present,  decelerations:  Absent UC:   regular, every 2-4 minutes,.not tracing well, toco readjusted SVE:   Dilation: 4.5 Effacement (%): 80 Station: -2 Exam by:: H Stone RN- deferred at this time  Labs: Lab Results  Component Value Date   WBC 13.1* 08/31/2013   HGB 13.9 08/31/2013   HCT 39.2 08/31/2013   MCV 91.8 08/31/2013   PLT 168 08/31/2013    Assessment / Plan: IOL d/t CHTN w/ superimposed severe pre-e, pitocin at 281mmin, srom'd shortly after my last sve- then promptly received epidural d/t increased pain.   Labor: early Preeclampsia:  on magnesium sulfate and no signs or symptoms of toxicity Fetal Wellbeing:  Category I Pain Control:  Epidural I/D:  n/a Anticipated MOD:  NSVD  Shelly Adams/05/2013, 11:31 PM

## 2013-08-31 NOTE — Progress Notes (Signed)
CNM notified RN of pt drinking orange julius. Educated patient on importance of maintaining a clear liquid diet and discussed the clear liquid diet options. Pt and family states understanding.

## 2013-08-31 NOTE — Progress Notes (Signed)
Shelly Adams is a 22 y.o. G1P0 at [redacted]w[redacted]d  Subjective: Mostly comfortable  Objective: BP 150/84  Pulse 82  Temp(Src) 98.4 F (36.9 C) (Oral)  Resp 18  Ht 4' 11"  (1.499 m)  Wt 78.019 kg (172 lb)  BMI 34.72 kg/m2  SpO2 98%  LMP 11/21/2012 I/O last 3 completed shifts: In: 5254.2 [P.O.:2080; I.V.:3174.2] Out: 5350 [Urine:5350] Total I/O In: 2923.3 [P.O.:1490; I.V.:1433.3] Out: 2950 [Urine:2950]  FHT:  FHR: 125-130 bpm, variability: moderate,  accelerations:  Present,  decelerations:  Absent UC:   irritability SVE:   Dilation: 1 Effacement (%): 70 Station: -2 Exam by:: dr. aHarolyn Rutherford exam deferred; RN just 'tugged' on bulb and it was tight  Labs: Lab Results  Component Value Date   WBC 10.0 08/29/2013   HGB 13.5 08/29/2013   HCT 37.8 08/29/2013   MCV 91.3 08/29/2013   PLT 169 08/29/2013    Assessment / Plan: IOL process CHTN with severe preeclampsia on magnesium- BPs stable currently  Will allow to eat breakfast and then begin Pitocin and hold at 626mmin  SHAW, [facility]/05/2013, 5:35 AM

## 2013-08-31 NOTE — Anesthesia Procedure Notes (Signed)
Epidural Patient location during procedure: OB Start time: 08/31/2013 9:10 PM End time: 08/31/2013 9:14 PM  Staffing Anesthesiologist: Laymond Purser Performed by: anesthesiologist   Preanesthetic Checklist Completed: patient identified, surgical consent, pre-op evaluation, timeout performed, IV checked, risks and benefits discussed and monitors and equipment checked  Epidural Patient position: sitting Prep: site prepped and draped and DuraPrep Patient monitoring: continuous pulse ox and blood pressure Approach: midline Injection technique: LOR air  Needle:  Needle type: Tuohy  Needle gauge: 17 G Needle length: 9 cm and 9 Needle insertion depth: 6 cm Catheter type: closed end flexible Catheter size: 19 Gauge Catheter at skin depth: 11 cm Test dose: negative and Other  Assessment Sensory level: T9 Events: blood not aspirated, injection not painful, no injection resistance, negative IV test and no paresthesia  Additional Notes Reason for block:procedure for pain

## 2013-08-31 NOTE — Anesthesia Preprocedure Evaluation (Signed)
Anesthesia Evaluation  Patient identified by MRN, date of birth, ID band Patient awake    Reviewed: Allergy & Precautions, H&P , NPO status , Patient's Chart, lab work & pertinent test results  Airway Mallampati: II TM Distance: >3 FB Neck ROM: full    Dental no notable dental hx.    Pulmonary    Pulmonary exam normal       Cardiovascular hypertension, Pt. on home beta blockers     Neuro/Psych negative neurological ROS     GI/Hepatic negative GI ROS, Neg liver ROS,   Endo/Other  diabetes, Gestational  Renal/GU negative Renal ROS  negative genitourinary   Musculoskeletal   Abdominal Normal abdominal exam  (+)   Peds  Hematology negative hematology ROS (+)   Anesthesia Other Findings   Reproductive/Obstetrics (+) Pregnancy                           Anesthesia Physical Anesthesia Plan  ASA: III  Anesthesia Plan: Epidural   Post-op Pain Management:    Induction:   Airway Management Planned:   Additional Equipment:   Intra-op Plan:   Post-operative Plan:   Informed Consent: I have reviewed the patients History and Physical, chart, labs and discussed the procedure including the risks, benefits and alternatives for the proposed anesthesia with the patient or authorized representative who has indicated his/her understanding and acceptance.     Plan Discussed with:   Anesthesia Plan Comments:         Anesthesia Quick Evaluation

## 2013-09-01 DIAGNOSIS — IMO0002 Reserved for concepts with insufficient information to code with codable children: Secondary | ICD-10-CM

## 2013-09-01 DIAGNOSIS — O1490 Unspecified pre-eclampsia, unspecified trimester: Secondary | ICD-10-CM

## 2013-09-01 DIAGNOSIS — I10 Essential (primary) hypertension: Secondary | ICD-10-CM

## 2013-09-01 DIAGNOSIS — O24419 Gestational diabetes mellitus in pregnancy, unspecified control: Secondary | ICD-10-CM

## 2013-09-01 LAB — GLUCOSE, CAPILLARY
Glucose-Capillary: 89 mg/dL (ref 70–99)
Glucose-Capillary: 91 mg/dL (ref 70–99)
Glucose-Capillary: 82 mg/dL (ref 70–99)

## 2013-09-01 MED ORDER — SODIUM BICARBONATE 8.4 % IV SOLN
INTRAVENOUS | Status: DC | PRN
  Administered 2013-09-01: 10 mL via EPIDURAL

## 2013-09-01 MED ORDER — SENNOSIDES-DOCUSATE SODIUM 8.6-50 MG PO TABS
2.0000 | ORAL_TABLET | Freq: Every day | ORAL | Status: DC
  Administered 2013-09-01 – 2013-09-02 (×2): 2 via ORAL

## 2013-09-01 MED ORDER — DIBUCAINE 1 % RE OINT: 1.0000 "application " | TOPICAL_OINTMENT | RECTAL | Status: DC | PRN

## 2013-09-01 MED ORDER — TETANUS-DIPHTH-ACELL PERTUSSIS 5-2.5-18.5 LF-MCG/0.5 IM SUSP
0.5000 mL | Freq: Once | INTRAMUSCULAR | Status: DC
  Filled 2013-09-01: qty 0.5

## 2013-09-01 MED ORDER — ONDANSETRON HCL 4 MG PO TABS: 4.0000 mg | ORAL_TABLET | ORAL | Status: DC | PRN

## 2013-09-01 MED ORDER — PRENATAL MULTIVITAMIN CH
1.0000 | ORAL_TABLET | Freq: Every day | ORAL | Status: DC
  Administered 2013-09-02 – 2013-09-03 (×2): 1 via ORAL
  Filled 2013-09-01 (×2): qty 1

## 2013-09-01 MED ORDER — BENZOCAINE-MENTHOL 20-0.5 % EX AERO: 1.0000 "application " | INHALATION_SPRAY | CUTANEOUS | Status: DC | PRN

## 2013-09-01 MED ORDER — ONDANSETRON HCL 4 MG/2ML IJ SOLN: 4.0000 mg | INTRAMUSCULAR | Status: DC | PRN

## 2013-09-01 MED ORDER — SIMETHICONE 80 MG PO CHEW: 80.0000 mg | CHEWABLE_TABLET | ORAL | Status: DC | PRN

## 2013-09-01 MED ORDER — OXYCODONE-ACETAMINOPHEN 5-325 MG PO TABS
1.0000 | ORAL_TABLET | ORAL | Status: DC | PRN
  Administered 2013-09-01 – 2013-09-03 (×5): 1 via ORAL
  Filled 2013-09-01 (×4): qty 1

## 2013-09-01 MED ORDER — LACTATED RINGERS IV SOLN
INTRAVENOUS | Status: DC
  Administered 2013-09-01 – 2013-09-02 (×2): via INTRAVENOUS

## 2013-09-01 MED ORDER — WITCH HAZEL-GLYCERIN EX PADS: 1.0000 "application " | MEDICATED_PAD | CUTANEOUS | Status: DC | PRN

## 2013-09-01 MED ORDER — ZOLPIDEM TARTRATE 5 MG PO TABS: 5.0000 mg | ORAL_TABLET | Freq: Every evening | ORAL | Status: DC | PRN

## 2013-09-01 MED ORDER — IBUPROFEN 600 MG PO TABS
600.0000 mg | ORAL_TABLET | Freq: Four times a day (QID) | ORAL | Status: DC
  Administered 2013-09-01 – 2013-09-03 (×8): 600 mg via ORAL
  Filled 2013-09-01 (×7): qty 1

## 2013-09-01 MED ORDER — LABETALOL HCL 100 MG PO TABS: 100.0000 mg | ORAL_TABLET | Freq: Two times a day (BID) | ORAL | Status: DC

## 2013-09-01 MED ORDER — DIPHENHYDRAMINE HCL 25 MG PO CAPS: 25.0000 mg | ORAL_CAPSULE | Freq: Four times a day (QID) | ORAL | Status: DC | PRN

## 2013-09-01 MED ORDER — LANOLIN HYDROUS EX OINT: TOPICAL_OINTMENT | CUTANEOUS | Status: DC | PRN

## 2013-09-01 NOTE — Progress Notes (Signed)
Shelly Adams is a 22 y.o. G1P0 at 27w0dadmitted for induction of labor [facility]e.  Also has A1DM  Subjective: Comfortable w/ epidural, no complaints Denies ha, scotomata, ruq/epigastric pain, n/v.    Objective: BP 152/82  Pulse 80  Temp(Src) 98.8 F (37.1 C) (Oral)  Resp 20  Ht 4' 11"  (1.499 m)  Wt 78.019 kg (172 lb)  BMI 34.72 kg/m2  SpO2 99%  LMP 11/21/2012 I/O last 3 completed shifts: In: 8175.3 [P.O.:3660; I.V.:4515.3] Out: 6550 [Urine:6550] Total I/O In: 1952.7 [P.O.:380; I.V.:1572.7] Out: 2050 [Urine:2050]  CBG (last 3)   Recent Labs  08/31/13 1738 08/31/13 2152 09/01/13 0122  GLUCAP 110* 108* 82     FHT:  FHR: 125 bpm, variability: moderate,  accelerations:  Present,  decelerations:  Absent UC:   regular, every 3-5 minutes SVE:  5/90/-1, IUPC placed w/o difficulty  DTRs 2+, no clonus  Labs: Lab Results  Component Value Date   WBC 13.1* 08/31/2013   HGB 13.9 08/31/2013   HCT 39.2 08/31/2013   MCV 91.8 08/31/2013   PLT 168 08/31/2013    Assessment / Plan: IOL d/t CHTN w/ SI pre-e, also A1DM, restarting pitocin now after pitocin rest, IUPC now in place RN to increase pitocin per protocol to achieve adequate mvu's/dilation  Labor: reinitiating pitocin now Preeclampsia:  on magnesium sulfate and no signs or symptoms of toxicity Fetal Wellbeing:  Category I Pain Control:  Epidural I/D:  n/a Anticipated MOD:  NSVD  BTawnya Crook9/06/2013, 4:24 AM

## 2013-09-01 NOTE — Progress Notes (Signed)
Pt placed on bedpan.  Pericare given by placing pt on bedpan and washed with castile soap and water

## 2013-09-01 NOTE — Progress Notes (Signed)
Shelly Adams is a 22 y.o. G1P0 at 35w0dadmitted for induction of labor due to Pre-eclamptic toxemia of pregnancy..  Subjective: Pain well controlled. Called to evaluate strip and pt had a prolonged decel of 857m. Not feeling pressure to push  Objective: BP 147/91  Pulse 84  Temp(Src) 98.5 F (36.9 C) (Oral)  Resp 20  Ht 4' 11"  (1.499 m)  Wt 78.019 kg (172 lb)  BMI 34.72 kg/m2  SpO2 99%  LMP 11/21/2012 I/O last 3 completed shifts: In: 8796.4 [P.O.:3700; I.V.:5096.4] Out: 8350 [Urine:8350] Total I/O In: 89005P.O.:100; I.V.:797] Out: 1125 [Urine:1125]  Filed Vitals:   09/01/13 1242 09/01/13 1247 09/01/13 1253 09/01/13 1301  BP: 159/98 106/74 127/80 147/91  Pulse: 91 91 92 84  Temp:      TempSrc:      Resp: 20 20 16 20   Height:      Weight:      SpO2:         FHT:  FHR: 120s bpm, variability: absent,  accelerations:  Abscent,  decelerations:  Present prolonged decel for ~73m74mnadir of 80 with ups and downs during decel. returned to baseline and tolerated ctx with O2, fluid bolus and reposition UC:   regular, every 2-3  minutes SVE:   Dilation: 10 Effacement (%): 100 Station: +2 Exam by:: S Grindstaff RN unchanged  Labs: Lab Results  Component Value Date   WBC 13.1* 08/31/2013   HGB 13.9 08/31/2013   HCT 39.2 08/31/2013   MCV 91.8 08/31/2013   PLT 168 08/31/2013    Assessment / Plan: Induction of labor due to preeclampsia,  progressing well on pitocin  Labor: Pt is complete, attempted to push and unable to feel 2/2 epidural. Will give additional 30-45 minutes as epidural wears down and try pushing again. Preeclampsia:  on magnesium sulfate Fetal Wellbeing:  Category II improved and reassuring Pain Control:  Epidural I/D:  n/a Anticipated MOD:  NSVD  Aren Cherne RYAN 09/01/2013, 1:47 PM

## 2013-09-01 NOTE — Progress Notes (Signed)
Dakari Stabler is a 22 y.o. G1P0 at 19w0dadmitted [facility] severe [facility], also has A1DM.  Subjective: Comfortable w/ epidural, no complaints Denies ha, scotomata, ruq/epigastric pain, n/v.    Objective: BP 144/66  Pulse 78  Temp(Src) 98.1 F (36.7 C) (Oral)  Resp 20  Ht 4' 11"  (1.499 m)  Wt 78.019 kg (172 lb)  BMI 34.72 kg/m2  SpO2 99%  LMP 11/21/2012 I/O last 3 completed shifts: In: 8796.4 [P.O.:3700; I.V.:5096.4] Out: 8350 [Urine:8350] Total I/O In: 249.3 [P.O.:100; I.V.:149.3] Out: 100 [Urine:100]  CBG (last 3)   Recent Labs  09/01/13 0122 09/01/13 0523 09/01/13 0732  GLUCAP 82 89 91     FHT:  FHR: 125 bpm, variability: moderate,  accelerations:  Present,  decelerations:  Absent UC:   regular, every 2-4 minutes SVE:   Dilation: 6 Effacement (%): 90 Station: -1 Exam by:: KDoree Fudge CNM  DTRs 2+, no clonus  Labs: Lab Results  Component Value Date   WBC 13.1* 08/31/2013   HGB 13.9 08/31/2013   HCT 39.2 08/31/2013   MCV 91.8 08/31/2013   PLT 168 08/31/2013    Assessment / Plan: IOL d/t CHTN w/ SI Pre-e, also A1DM, pitocin at 174mmin, mvu's 120s-160s, some cervical change. Will continue to increase pitocin per protocol to acheive adequate mvu's/dilation  Labor: starting to make cervical change Preeclampsia:  on magnesium sulfate and no signs or symptoms of toxicity Fetal Wellbeing:  Category I Pain Control:  Epidural I/D:  n/a Anticipated MOD:  NSVD  BoTawnya Crook/06/2013, 8:32 AM

## 2013-09-01 NOTE — Progress Notes (Signed)
Krupa Stege is a 22 y.o. G1P0 at 77w0dadmitted for [facility] of [facility]. Also has A1DM  Subjective: Comfortable w/ epidural, no complaints Denies ha, scotomata, ruq/epigastric pain, n/v.    Objective: BP 161/77  Pulse 86  Temp(Src) 98.2 F (36.8 C) (Oral)  Resp 18  Ht 4' 11"  (1.499 m)  Wt 78.019 kg (172 lb)  BMI 34.72 kg/m2  SpO2 99%  LMP 11/21/2012 I/O last 3 completed shifts: In: 8175.3 [P.O.:3660; I.V.:4515.3] Out: 66599[Urine:6550] Total I/O In: 1517.7 [P.O.:320; I.V.:1197.7] Out: 1400 [Urine:1400]  CBG (last 3)   Recent Labs  08/31/13 1738 08/31/13 2152 09/01/13 0122  GLUCAP 110* 108* 82     FHT:  FHR: 125 bpm, variability: min-mod,  accelerations:  Present,  decelerations:  Present earlies UC:   regular, every 1-3 minutes SVE:   Dilation: 4.5 Effacement (%): 80;90 Station: -2 Exam by:: HApple ComputerRN  Labs: Lab Results  Component Value Date   WBC 13.1* 08/31/2013   HGB 13.9 08/31/2013   HCT 39.2 08/31/2013   MCV 91.8 08/31/2013   PLT 168 08/31/2013    Assessment / Plan: IOL d/t CHTN w/ SI pre-e, also has A1DM, pitocin at 247mmin, no cervical change D/C pitocin for now to allow for rest from pitocin. Will restart ~ 0400 and place IUPC at same time  Labor: early Preeclampsia:  on magnesium sulfate and no signs or symptoms of toxicity Fetal Wellbeing:  Category I Pain Control:  Epidural I/D:  n/a Anticipated MOD:  NSVD  BoTawnya Crook/06/2013, 1:53 AM

## 2013-09-01 NOTE — Progress Notes (Signed)
Shelly Adams is a 22 y.o. G1P0 at 20w0dadmitted for induction of labor due to Pre-eclamptic toxemia of pregnancy..  Subjective: Pt with difficult pain control. Rec'd multiple rebolus and being reevaluated by CRNA Severe range BP - giving labetalol Objective: BP 134/80  Pulse 87  Temp(Src) 98.5 F (36.9 C) (Oral)  Resp 16  Ht 4' 11"  (1.499 m)  Wt 78.019 kg (172 lb)  BMI 34.72 kg/m2  SpO2 99%  LMP 11/21/2012 I/O last 3 completed shifts: In: 8796.4 [P.O.:3700; I.V.:5096.4] Out: 8350 [Urine:8350] Total I/O In: 510 [P.O.:100; I.V.:410] Out: 800 [Urine:800]  Filed Vitals:   09/01/13 1031 09/01/13 1101 09/01/13 1102 09/01/13 1131  BP: 138/93  143/91 134/80  Pulse: 88  87 87  Temp:      TempSrc:      Resp: 20 19  16   Height:      Weight:      SpO2:         FHT:  FHR: 120s bpm, variability: moderate,  accelerations:  Abscent,  decelerations:  Present occasional early and variable UC:   regular, every 2-3  minutes SVE:   Dilation: 5.5 Effacement (%): 90 Station: -1 Exam by:: Dr HIhor Dow Labs: Lab Results  Component Value Date   WBC 13.1* 08/31/2013   HGB 13.9 08/31/2013   HCT 39.2 08/31/2013   MCV 91.8 08/31/2013   PLT 168 08/31/2013    Assessment / Plan: Induction of labor due to preeclampsia,  progressing well on pitocin  Labor: Slow progression on pitocin. last check by attending with plan to repeat at 1300 by same examiner. deferred this check Preeclampsia:  on magnesium sulfate Fetal Wellbeing:  Category II Pain Control:  Epidural I/D:  n/a Anticipated MOD:  NSVD but if not progressing may require PLTCS  Meril Dray RYAN 09/01/2013, 12:06 PM

## 2013-09-01 NOTE — Progress Notes (Signed)
Shelly Adams is a 22 y.o. G1P0 at 68w0dby ultrasound admitted for induction of labor due to Gestational diabetes and Pre-eclamptic toxemia of pregnancy..  Subjective: Pt c/o pain with ctx.  She denies HA or changes in vision.   Objective: BP 164/93  Pulse 86  Temp(Src) 98.1 F (36.7 C) (Oral)  Resp 16  Ht 4' 11"  (1.499 m)  Wt 172 lb (78.019 kg)  BMI 34.72 kg/m2  SpO2 99%  LMP 11/21/2012 I/O last 3 completed shifts: In: 8796.4 [P.O.:3700; I.V.:5096.4] Out: 8350 [Urine:8350] Total I/O In: 249.3 [P.O.:100; I.V.:149.3] Out: 100 [Urine:100]  FHT:  FHR: 120's bpm, variability: moderate,  accelerations:  Present,  decelerations:  Absent UC:   regular, every 2-3 minutes SVE:   Dilation: 5.5 Effacement (%): 90 Station: -1 Exam by:: Dr HIhor Dow Labs: Lab Results  Component Value Date   WBC 13.1* 08/31/2013   HGB 13.9 08/31/2013   HCT 39.2 08/31/2013   MCV 91.8 08/31/2013   PLT 168 08/31/2013    Assessment / Plan: Induction of labor due to preeclampsia and gestational diabetes,  progressing well on pitocin  Labor: still in latent phase labor.  Just now becoming adequate Preeclampsia:  on magnesium sulfate Fetal Wellbeing:  Category I Pain Control:  Epidural I/D:  n/a Anticipated MOD:  NSVD Will recheck cervix in 4hours after adequate MVU's  HARRAWAY-SMITH, Avonell Lenig 09/01/2013, 9:09 AM

## 2013-09-02 ENCOUNTER — Encounter: Payer: Self-pay | Admitting: Obstetrics and Gynecology

## 2013-09-02 ENCOUNTER — Encounter (HOSPITAL_COMMUNITY): Payer: Self-pay | Admitting: *Deleted

## 2013-09-02 ENCOUNTER — Other Ambulatory Visit: Payer: 59

## 2013-09-02 LAB — CBC
HCT: 31.1 % — ABNORMAL LOW (ref 36.0–46.0)
MCH: 32.1 pg (ref 26.0–34.0)
MCV: 92.6 fL (ref 78.0–100.0)
RBC: 3.36 MIL/uL — ABNORMAL LOW (ref 3.87–5.11)
WBC: 14.7 10*3/uL — ABNORMAL HIGH (ref 4.0–10.5)

## 2013-09-02 MED ORDER — AMLODIPINE BESYLATE 5 MG PO TABS
5.0000 mg | ORAL_TABLET | Freq: Every day | ORAL | Status: DC
  Administered 2013-09-02: 5 mg via ORAL
  Filled 2013-09-02 (×2): qty 1

## 2013-09-02 MED ORDER — LABETALOL HCL 5 MG/ML IV SOLN: 10.0000 mg | INTRAVENOUS | Status: DC | PRN

## 2013-09-02 MED ORDER — HYDROCHLOROTHIAZIDE 25 MG PO TABS
25.0000 mg | ORAL_TABLET | Freq: Every day | ORAL | Status: DC
  Administered 2013-09-02 – 2013-09-03 (×2): 25 mg via ORAL
  Filled 2013-09-02 (×2): qty 1

## 2013-09-02 NOTE — Anesthesia Postprocedure Evaluation (Signed)
  Anesthesia Post-op Note  Patient: Shelly Adams  Procedure(s) Performed: * No procedures listed *  Patient Location: PACU and A-ICU  Anesthesia Type:Epidural  Level of Consciousness: awake, alert , oriented and patient cooperative  Airway and Oxygen Therapy: Patient Spontanous Breathing  Post-op Pain: mild  Post-op Assessment: Patient's Cardiovascular Status Stable, Respiratory Function Stable, Patent Airway, No signs of Nausea or vomiting, Adequate PO intake and Pain level controlled  Post-op Vital Signs: Reviewed and stable  Complications: No apparent anesthesia complications

## 2013-09-02 NOTE — Progress Notes (Signed)
UR chart review completed.  

## 2013-09-02 NOTE — Progress Notes (Signed)
Post Partum Day 1 Subjective: no complaints, up ad lib, voiding and tolerating PO Pt denies HA or visual changes.  Objective: Blood pressure 147/92, pulse 74, temperature 97.5 F (36.4 C), temperature source Oral, resp. rate 16, height 4' 11"  (1.499 m), weight 173 lb 11.2 oz (78.79 kg), last menstrual period 11/21/2012, SpO2 100.00%.  Physical Exam:  General: alert and no distress Lochia: appropriate Uterine Fundus: firm DVT Evaluation: No evidence of DVT seen on physical exam.   Recent Labs  08/31/13 2019 09/02/13 0507  HGB 13.9 10.8*  HCT 39.2 31.1*    Assessment/Plan: Lactation consult Pt undecided.  Wants info on Mirena BP's still elevated but, pt is diuresing well On magnesium sulfate for preeclampsia will d/c at 1500  LOS: 4 days   HARRAWAY-SMITH, Carol Theys 09/02/2013, 7:28 AM

## 2013-09-03 MED ORDER — HYDROCHLOROTHIAZIDE 25 MG PO TABS: 25.0000 mg | ORAL_TABLET | Freq: Every day | ORAL | Status: DC

## 2013-09-03 MED ORDER — AMLODIPINE BESYLATE 5 MG PO TABS
5.0000 mg | ORAL_TABLET | Freq: Every day | ORAL | Status: DC
  Administered 2013-09-03: 5 mg via ORAL
  Filled 2013-09-03: qty 1

## 2013-09-03 MED ORDER — AMLODIPINE BESYLATE 10 MG PO TABS
10.0000 mg | ORAL_TABLET | Freq: Every day | ORAL | Status: DC
  Filled 2013-09-03: qty 1

## 2013-09-03 MED ORDER — AMLODIPINE BESYLATE 5 MG PO TABS: 5.0000 mg | ORAL_TABLET | Freq: Every day | ORAL | Status: DC

## 2013-09-03 MED ORDER — IBUPROFEN 600 MG PO TABS: 600.0000 mg | ORAL_TABLET | Freq: Four times a day (QID) | ORAL | Status: DC

## 2013-09-03 MED ORDER — ZOLPIDEM TARTRATE 5 MG PO TABS: 5.0000 mg | ORAL_TABLET | Freq: Every evening | ORAL | Status: DC | PRN

## 2013-09-03 NOTE — Discharge Summary (Signed)
Obstetric Discharge Summary Reason for Admission: induction of labor and severe preeclampsia based on pressures Prenatal Procedures: NST Intrapartum Procedures: vacuum assisted vaginal delivery Postpartum Procedures: monitoring bp and diuresis Complications-Operative and Postpartum: vaginal laceration repaired with 3.0 vicryl Hemoglobin  Date Value Range Status  09/02/2013 10.8* 12.0 - 15.0 g/dL Final     DELTA CHECK NOTED     REPEATED TO VERIFY  05/03/2013 38.8   Final     HCT  Date Value Range Status  09/02/2013 31.1* 36.0 - 46.0 % Final  05/03/2013 13   Final    Physical Exam:  General: alert, cooperative, appears stated age and no distress Lochia: appropriate Uterine Fundus: Frim at umbilicus Incision: NA CTAB no wrc RRR no mgt DVT Evaluation: No evidence of DVT seen on physical exam. Negative Homan's sign. No cords or calf tenderness. Calf/Ankle edema is present.  Discharge Diagnoses: Term Pregnancy-delivered and Preelampsia  Discharge Information: Date: 09/03/2013 Activity: pelvic rest Diet: routine Medications: PNV, Ibuprofen and HCTZ 44m qday and norvasc 576mqday Condition: stable Instructions: refer to practice specific booklet Discharge to: home Follow-up Information   [facility] 2 weeks and 6weeks for blood pressure check and Post partum check)    Specialty:  Obstetrics and Gynecology   Contact information:   [phone]  MOC: mirena MOF: breast/bottle Sleep: Ambien 20m58m 10T for aide in sleep. 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  Recommend checking repair and monitoring BPS, will have baby love BP check and 2week follow up for pressures. 2hr glucola or fasting glucose at f/u  Newborn Data: Live born female  Birth Weight: 5 lb 11.4 oz (2591 g) APGAR: 6, 8  Home with mother.  ODOFredrik Rigger9/2014,  7:55 AM

## 2013-09-03 NOTE — Progress Notes (Signed)
Pt stated that she feels anxious d/t to inability to sleep while in the hospital - requested clonazepam which she reports taking for a sleep disorder but doesn't have any at home because it is in storage after her move from [place]. Notified Dr Leslie Andrea

## 2013-09-03 NOTE — Progress Notes (Signed)
D/C instructions given to pt - questions answered.

## 2013-09-04 NOTE — Progress Notes (Signed)
Pt discharged before CSW could assess reason for [facility] & history of anxiety.  CSW will monitor drug screen results & make a referral if necessary.

## 2013-09-05 NOTE — Discharge Summary (Signed)
Attestation of Attending Supervision of Advanced Practitioner (CNM/NP): Evaluation and management procedures were performed by the Advanced Practitioner under my supervision and collaboration.  I have reviewed the Advanced Practitioner's note and chart, and I agree with the management and plan.  Jaselyn Nahm 09/05/2013 11:33 AM

## 2013-09-13 ENCOUNTER — Encounter: Payer: Self-pay | Admitting: *Deleted

## 2013-10-03 ENCOUNTER — Ambulatory Visit (INDEPENDENT_AMBULATORY_CARE_PROVIDER_SITE_OTHER): Payer: PRIVATE HEALTH INSURANCE | Admitting: Advanced Practice Midwife

## 2013-10-03 ENCOUNTER — Encounter: Payer: Self-pay | Admitting: Obstetrics and Gynecology

## 2013-10-03 VITALS — BP 124/80 | HR 88 | Temp 97.1°F | Ht 61.0 in | Wt 153.5 lb

## 2013-10-03 DIAGNOSIS — Z01812 Encounter for preprocedural laboratory examination: Secondary | ICD-10-CM

## 2013-10-03 DIAGNOSIS — Z30430 Encounter for insertion of intrauterine contraceptive device: Secondary | ICD-10-CM

## 2013-10-03 LAB — POCT PREGNANCY, URINE: Preg Test, Ur: NEGATIVE

## 2013-10-03 NOTE — Progress Notes (Signed)
  Subjective:     Shelly Adams is a 22 y.o. female who presents for a postpartum visit. She is 4 weeks postpartum following a vacuum assisted vaginal delivery.  I have fully reviewed the prenatal and intrapartum course. The delivery was at 36.4 gestational weeks. Patient was induced for pre-eclampsia.  Outcome: vacuum assisted vaginal delivery. Anesthesia: epidural. Postpartum course has been uncomplicated. Baby's course has been uncomplicated. Baby is feeding by bottle - Enfamil. Bleeding no bleeding. Bowel function is normal. Bladder function is normal. Patient is not sexually active. Contraception method is abstinence. After discussion, patient desires IUD today.    Review of Systems Pertinent items are noted in HPI.   Objective:    BP 124/80  Pulse 88  Temp(Src) 97.1 F (36.2 C) (Oral)  Ht 5' 1"  (1.549 m)  Wt 153 lb 8 oz (69.627 kg)  BMI 29.02 kg/m2  Breastfeeding? No  General:  alert, cooperative and no distress  Lungs: clear to auscultation bilaterally  Heart:  regular rate and rhythm, S1, S2 normal, no murmur, click, rub or gallop  Abdomen: soft, non-tender; bowel sounds normal; no masses,  no organomegaly   Vulva:  normal  Vagina: normal vagina  Cervix:  no lesions           GYNECOLOGY CLINIC PROCEDURE NOTE  IUD Insertion Procedure Note Patient identified, informed consent performed.  Discussed risks of irregular bleeding, cramping, infection, malpositioning or misplacement of the IUD outside the uterus which may require further procedures.   Speculum placed in the vagina.  Cervix visualized.  Cleaned with Betadine x 2.  Topical anesthetic spray applied. Grasped anteriorly with a single tooth tenaculum.  Uterus sounded to 6 cm.  Mirena IUD placed per manufacturer's recommendations.  Strings trimmed to approximately 3 cm. Tenaculum was removed, with minimal bleeding.  Patient tolerated procedure well.   Patient was given post-procedure instructions.  Patient was also asked to  check IUD strings periodically and follow up in 4 weeks for IUD check.   Assessment:    4 week postpartum exam. Patient doing well.   IUD placed.   Plan:    1. Contraception: IUD (see above).  2. Follow up in: 4 weeks.

## 2013-10-16 ENCOUNTER — Encounter: Payer: Self-pay | Admitting: *Deleted

## 2013-11-18 ENCOUNTER — Ambulatory Visit: Payer: PRIVATE HEALTH INSURANCE | Admitting: Obstetrics & Gynecology

## 2014-10-27 ENCOUNTER — Encounter: Payer: Self-pay | Admitting: Obstetrics and Gynecology

## 2014-12-02 ENCOUNTER — Encounter (HOSPITAL_BASED_OUTPATIENT_CLINIC_OR_DEPARTMENT_OTHER): Payer: Self-pay | Admitting: *Deleted

## 2014-12-02 ENCOUNTER — Emergency Department (HOSPITAL_BASED_OUTPATIENT_CLINIC_OR_DEPARTMENT_OTHER)
Admission: EM | Admit: 2014-12-02 | Discharge: 2014-12-02 | Disposition: A | Discharge status: Home / Self Care | Payer: PRIVATE HEALTH INSURANCE | Attending: Emergency Medicine | Admitting: Emergency Medicine

## 2014-12-02 DIAGNOSIS — Z8632 Personal history of gestational diabetes: Secondary | ICD-10-CM | POA: Insufficient documentation

## 2014-12-02 DIAGNOSIS — Z791 Long term (current) use of non-steroidal anti-inflammatories (NSAID): Secondary | ICD-10-CM | POA: Insufficient documentation

## 2014-12-02 DIAGNOSIS — Z3202 Encounter for pregnancy test, result negative: Secondary | ICD-10-CM | POA: Insufficient documentation

## 2014-12-02 DIAGNOSIS — Z8639 Personal history of other endocrine, nutritional and metabolic disease: Secondary | ICD-10-CM | POA: Diagnosis not present

## 2014-12-02 DIAGNOSIS — R5383 Other fatigue: Secondary | ICD-10-CM | POA: Diagnosis not present

## 2014-12-02 DIAGNOSIS — Z79899 Other long term (current) drug therapy: Secondary | ICD-10-CM | POA: Insufficient documentation

## 2014-12-02 DIAGNOSIS — Z8659 Personal history of other mental and behavioral disorders: Secondary | ICD-10-CM | POA: Diagnosis not present

## 2014-12-02 DIAGNOSIS — R5381 Other malaise: Secondary | ICD-10-CM | POA: Diagnosis not present

## 2014-12-02 DIAGNOSIS — Z87442 Personal history of urinary calculi: Secondary | ICD-10-CM | POA: Diagnosis not present

## 2014-12-02 DIAGNOSIS — I10 Essential (primary) hypertension: Secondary | ICD-10-CM | POA: Diagnosis not present

## 2014-12-02 DIAGNOSIS — G4700 Insomnia, unspecified: Secondary | ICD-10-CM | POA: Insufficient documentation

## 2014-12-02 DIAGNOSIS — R112 Nausea with vomiting, unspecified: Secondary | ICD-10-CM | POA: Diagnosis present

## 2014-12-02 LAB — BASIC METABOLIC PANEL
Anion gap: 12 (ref 5–15)
BUN: 9 mg/dL (ref 6–23)
CALCIUM: 9.7 mg/dL (ref 8.4–10.5)
CHLORIDE: 103 meq/L (ref 96–112)
CO2: 26 meq/L (ref 19–32)
CREATININE: 0.7 mg/dL (ref 0.50–1.10)
GFR calc Af Amer: >90 mL/min (ref >90)
GFR calc non Af Amer: >90 mL/min (ref >90)
GLUCOSE: 95 mg/dL (ref 70–99)
Potassium: 4.3 mEq/L (ref 3.7–5.3)
Sodium: 141 mEq/L (ref 137–147)

## 2014-12-02 LAB — CBC WITH DIFFERENTIAL/PLATELET
BASOS ABS: 0 10*3/uL (ref 0.0–0.1)
BASOS PCT: 1 % (ref 0–1)
EOS PCT: 4 % (ref 0–5)
Eosinophils Absolute: 0.2 10*3/uL (ref 0.0–0.7)
HEMATOCRIT: 38.7 % (ref 36.0–46.0)
HEMOGLOBIN: 13.7 g/dL (ref 12.0–15.0)
LYMPHS PCT: 33 % (ref 12–46)
Lymphs Abs: 1.8 10*3/uL (ref 0.7–4.0)
MCH: 31.8 pg (ref 26.0–34.0)
MCHC: 35.4 g/dL (ref 30.0–36.0)
MCV: 89.8 fL (ref 78.0–100.0)
MONO ABS: 0.5 10*3/uL (ref 0.1–1.0)
MONOS PCT: 8 % (ref 3–12)
Neutro Abs: 2.9 10*3/uL (ref 1.7–7.7)
Neutrophils Relative %: 54 % (ref 43–77)
Platelets: 225 10*3/uL (ref 150–400)
RBC: 4.31 MIL/uL (ref 3.87–5.11)
RDW: 12.3 % (ref 11.5–15.5)
WBC: 5.4 10*3/uL (ref 4.0–10.5)

## 2014-12-02 LAB — URINE MICROSCOPIC-ADD ON

## 2014-12-02 LAB — URINALYSIS, ROUTINE W REFLEX MICROSCOPIC
BILIRUBIN URINE: NEGATIVE
Glucose, UA: NEGATIVE mg/dL
KETONES UR: NEGATIVE mg/dL
Leukocytes, UA: NEGATIVE
Nitrite: NEGATIVE
PROTEIN: NEGATIVE mg/dL
Specific Gravity, Urine: 1.009 (ref 1.005–1.030)
UROBILINOGEN UA: 0.2 mg/dL (ref 0.0–1.0)
pH: 6.5 (ref 5.0–8.0)

## 2014-12-02 LAB — TROPONIN I: Troponin I: <0.3 ng/mL (ref <0.30)

## 2014-12-02 LAB — PREGNANCY, URINE: Preg Test, Ur: NEGATIVE

## 2014-12-02 NOTE — Discharge Instructions (Signed)
Your blood pressure was elevated today at 143/89.  This is likely due to your diet medication.  Please stop taking this medication today.  Start checking your blood pressure one to two times daily and write down the numbers for your family doctor.   Hypertension Hypertension, commonly called high blood pressure, is when the force of blood pumping through your arteries is too strong. Your arteries are the blood vessels that carry blood from your heart throughout your body. A blood pressure reading consists of a higher number over a lower number, such as 110/72. The higher number (systolic) is the pressure inside your arteries when your heart pumps. The lower number (diastolic) is the pressure inside your arteries when your heart relaxes. Ideally you want your blood pressure below 120/80. Hypertension forces your heart to work harder to pump blood. Your arteries may become narrow or stiff. Having hypertension puts you at risk for heart disease, stroke, and other problems.  RISK FACTORS Some risk factors for high blood pressure are controllable. Others are not.  Risk factors you cannot control include:   Race. You may be at higher risk if you are African American.  Age. Risk increases with age.  Gender. Men are at higher risk than women before age 60 years. After age 2, women are at higher risk than men. Risk factors you can control include:  Not getting enough exercise or physical activity.  Being overweight.  Getting too much fat, sugar, calories, or salt in your diet.  Drinking too much alcohol. SIGNS AND SYMPTOMS Hypertension does not usually cause signs or symptoms. Extremely high blood pressure (hypertensive crisis) may cause headache, anxiety, shortness of breath, and nosebleed. DIAGNOSIS  To check if you have hypertension, your health care provider will measure your blood pressure while you are seated, with your arm held at the level of your heart. It should be measured at least twice  using the same arm. Certain conditions can cause a difference in blood pressure between your right and left arms. A blood pressure reading that is higher than normal on one occasion does not mean that you need treatment. If one blood pressure reading is high, ask your health care provider about having it checked again. TREATMENT  Treating high blood pressure includes making lifestyle changes and possibly taking medicine. Living a healthy lifestyle can help lower high blood pressure. You may need to change some of your habits. Lifestyle changes may include:  Following the DASH diet. This diet is high in fruits, vegetables, and whole grains. It is low in salt, red meat, and added sugars.  Getting at least 2 hours of brisk physical activity every week.  Losing weight if necessary.  Not smoking.  Limiting alcoholic beverages.  Learning ways to reduce stress. If lifestyle changes are not enough to get your blood pressure under control, your health care provider may prescribe medicine. You may need to take more than one. Work closely with your health care provider to understand the risks and benefits. HOME CARE INSTRUCTIONS  Have your blood pressure rechecked as directed by your health care provider.   Take medicines only as directed by your health care provider. Follow the directions carefully. Blood pressure medicines must be taken as prescribed. The medicine does not work as well when you skip doses. Skipping doses also puts you at risk for problems.   Do not smoke.   Monitor your blood pressure at home as directed by your health care provider. SEEK MEDICAL CARE IF:  You think you are having a reaction to medicines taken.  You have recurrent headaches or feel dizzy.  You have swelling in your ankles.  You have trouble with your vision. SEEK IMMEDIATE MEDICAL CARE IF:  You develop a severe headache or confusion.  You have unusual weakness, numbness, or feel faint.  You  have severe chest or abdominal pain.  You vomit repeatedly.  You have trouble breathing. MAKE SURE YOU:   Understand these instructions.  Will watch your condition.  Will get help right away if you are not doing well or get worse. Document Released: 12/12/2005 Document Revised: 04/28/2014 Document Reviewed: 10/04/2013 [facility] Patient Information 2015 [place], [place]. This information is not intended to replace advice given to you by your health care provider. Make sure you discuss any questions you have with your health care provider.   Fatigue Fatigue is a feeling of tiredness, lack of energy, lack of motivation, or feeling tired all the time. Having enough rest, good nutrition, and reducing stress will normally reduce fatigue. Consult your caregiver if it persists. The nature of your fatigue will help your caregiver to find out its cause. The treatment is based on the cause.  CAUSES  There are many causes for fatigue. Most of the time, fatigue can be traced to one or more of your habits or routines. Most causes fit into one or more of three general areas. They are: Lifestyle problems  Sleep disturbances.  Overwork.  Physical exertion.  Unhealthy habits.  Poor eating habits or eating disorders.  Alcohol and/or drug use .  Lack of proper nutrition (malnutrition). Psychological problems  Stress and/or anxiety problems.  Depression.  Grief.  Boredom. Medical Problems or Conditions  Anemia.  Pregnancy.  Thyroid gland problems.  Recovery from major surgery.  Continuous pain.  Emphysema or asthma that is not well controlled  Allergic conditions.  Diabetes.  Infections (such as mononucleosis).  Obesity.  Sleep disorders, such as sleep apnea.  Heart failure or other heart-related problems.  Cancer.  Kidney disease.  Liver disease.  Effects of certain medicines such as antihistamines, cough and cold remedies, prescription pain medicines, heart and  blood pressure medicines, drugs used for treatment of cancer, and some antidepressants. SYMPTOMS  The symptoms of fatigue include:   Lack of energy.  Lack of drive (motivation).  Drowsiness.  Feeling of indifference to the surroundings. DIAGNOSIS  The details of how you feel help guide your caregiver in finding out what is causing the fatigue. You will be asked about your present and past health condition. It is important to review all medicines that you take, including prescription and non-prescription items. A thorough exam will be done. You will be questioned about your feelings, habits, and normal lifestyle. Your caregiver may suggest blood tests, urine tests, or other tests to look for common medical causes of fatigue.  TREATMENT  Fatigue is treated by correcting the underlying cause. For example, if you have continuous pain or depression, treating these causes will improve how you feel. Similarly, adjusting the dose of certain medicines will help in reducing fatigue.  HOME CARE INSTRUCTIONS   Try to get the required amount of good sleep every night.  Eat a healthy and nutritious diet, and drink enough water throughout the day.  Practice ways of relaxing (including yoga or meditation).  Exercise regularly.  Make plans to change situations that cause stress. Act on those plans so that stresses decrease over time. Keep your work and personal routine reasonable.  Avoid  street drugs and minimize use of alcohol.  Start taking a daily multivitamin after consulting your caregiver. SEEK MEDICAL CARE IF:   You have persistent tiredness, which cannot be accounted for.  You have fever.  You have unintentional weight loss.  You have headaches.  You have disturbed sleep throughout the night.  You are feeling sad.  You have constipation.  You have dry skin.  You have gained weight.  You are taking any new or different medicines that you suspect are causing fatigue.  You are  unable to sleep at night.  You develop any unusual swelling of your legs or other parts of your body. SEEK IMMEDIATE MEDICAL CARE IF:   You are feeling confused.  Your vision is blurred.  You feel faint or pass out.  You develop severe headache.  You develop severe abdominal, pelvic, or back pain.  You develop chest pain, shortness of breath, or an irregular or fast heartbeat.  You are unable to pass a normal amount of urine.  You develop abnormal bleeding such as bleeding from the rectum or you vomit blood.  You have thoughts about harming yourself or committing suicide.  You are worried that you might harm someone else. MAKE SURE YOU:   Understand these instructions.  Will watch your condition.  Will get help right away if you are not doing well or get worse. Document Released: 10/09/2007 Document Revised: 03/05/2012 Document Reviewed: 04/15/2014 [facility] Patient Information 2015 [place], [place]. This information is not intended to replace advice given to you by your health care provider. Make sure you discuss any questions you have with your health care provider.

## 2014-12-02 NOTE — ED Notes (Signed)
C/o palpitations, high blood pressure and sluggish and tired. Fleeting chest pain. States she is taking fantermine for wt loss for 1 month.

## 2014-12-02 NOTE — ED Provider Notes (Signed)
CSN: [id number]     Arrival date & time 12/02/14  1856 History   First MD Initiated Contact with Patient 12/02/14 [phone]     Chief Complaint  Patient presents with  . Hypertension     Patient is a 23 y.o. female presenting with hypertension. The history is provided by the patient.  Hypertension   Patient presents for evaluation of possible hypertension. She thinks her blood pressure is high because she is having a flushed feeling described as feeling hot inside for the last 2 days with associated intermittent brief episodes of chest discomfort. She had an episode of nausea and vomiting yesterday which has since resolved. She reports feeling increased anxiety and she woke up feeling sluggish for last 2 mornings. She has been taking phentermine for weight loss for the last month, and she has been off her blood pressure medicines for the last year. She does not routinely check her blood pressure medicine but her blood pressures improved and that's why her medications for blood pressure were discontinued. She denies any fevers, difficulty breathing, vomiting, leg edema, new numbness or weakness. Symptoms are moderate, intermittent, and worsening.  Past Medical History  Diagnosis Date  . Hyperlipemia   . Hypertension   . Recurrent kidney stones   . Insomnia   . Anxiety   . Gestational diabetes    Past Surgical History  Procedure Laterality Date  . Breast reduction surgery  2009   Family History  Problem Relation Age of Onset  . Hypertension Mother   . Hyperlipidemia Mother   . GER disease Mother   . Hyperlipidemia Father   . Hypertension Father    History  Substance Use Topics  . Smoking status: Never Smoker   . Smokeless tobacco: Never Used  . Alcohol Use: No   OB History    Gravida Para Term Preterm AB TAB SAB Ectopic Multiple Living   1 1 1       1      Review of Systems  All other systems reviewed and are negative.     Allergies  Review of patient's allergies  indicates no known allergies.  Home Medications   Prior to Admission medications   Medication Sig Start Date End Date Taking? Authorizing Provider  albuterol (PROVENTIL HFA;VENTOLIN HFA) 108 (90 BASE) MCG/ACT inhaler Inhale 1 puff into the lungs every 6 (six) hours as needed for wheezing.   Yes Historical Provider, MD  amLODipine (NORVASC) 5 MG tablet Take 1 tablet (5 mg total) by mouth daily. 09/03/13  Yes Allen Norris, MD  Omega-3 Fatty Acids (FISH OIL PO) Take 200 mg by mouth daily.   Yes Historical Provider, MD  glucose blood (BL TEST STRIP PACK) test strip Use as instructed 08/08/13   Woodroe Mode, MD  hydrochlorothiazide (HYDRODIURIL) 25 MG tablet Take 1 tablet (25 mg total) by mouth daily. 09/03/13   Allen Norris, MD  ibuprofen (ADVIL,MOTRIN) 600 MG tablet Take 1 tablet (600 mg total) by mouth every 6 (six) hours. 09/03/13   Allen Norris, MD  Prenatal Vit-Fe Fumarate-FA (PRENATAL MULTIVITAMIN) TABS tablet Take 1 tablet by mouth daily at 12 noon.    Historical Provider, MD  zolpidem (AMBIEN) 5 MG tablet Take 1 tablet (5 mg total) by mouth at bedtime as needed for sleep. 09/03/13   Allen Norris, MD   BP 143/89 mmHg  Pulse 103  Temp(Src) 98.7 F (37.1 C) (Oral)  Resp 16  Ht 5' (1.524 m)  Wt 145 lb (  65.772 kg)  BMI 28.32 kg/m2  SpO2 100%  LMP 11/30/2014 Physical Exam  Constitutional: She is oriented to person, place, and time. She appears well-developed and well-nourished.  HENT:  Head: Normocephalic and atraumatic.  Cardiovascular: Normal rate and regular rhythm.   No murmur heard. Pulmonary/Chest: Effort normal and breath sounds normal. No respiratory distress.  Abdominal: Soft. There is no tenderness. There is no rebound and no guarding.  Musculoskeletal: She exhibits no edema or tenderness.  Neurological: She is alert and oriented to person, place, and time.  Skin: Skin is warm and dry.  Psychiatric: She has a normal mood and affect. Her behavior is normal.  Nursing note  and vitals reviewed.   ED Course  Procedures (including critical care time) Labs Review Labs Reviewed  URINALYSIS, ROUTINE W REFLEX MICROSCOPIC - Abnormal; Notable for the following:    Hgb urine dipstick MODERATE (*)    All other components within normal limits  URINE MICROSCOPIC-ADD ON - Abnormal; Notable for the following:    Squamous Epithelial / LPF FEW (*)    Bacteria, UA FEW (*)    All other components within normal limits  PREGNANCY, URINE  BASIC METABOLIC PANEL  CBC WITH DIFFERENTIAL  TROPONIN I    Imaging Review No results found.   EKG Interpretation   Date/Time:  Tuesday December 02 2014 10:11:58 EST Ventricular Rate:  86 PR Interval:  148 QRS Duration: 78 QT Interval:  342 QTC Calculation: 409 R Axis:   74 Text Interpretation:  Normal sinus rhythm Normal ECG Confirmed by Hazle Coca [phone]) on 12/02/2014 11:09:47 AM      MDM   Final diagnoses:  Malaise and fatigue    Patient here for evaluation of malaise and fatigue and concern for elevated blood pressure. Clinical picture is consistent with medication reaction related to her phenteramine use. There is no hypertensive urgency or serious bacterial infection. Patient was mildly hypertensive on ED arrival, which with rest. Discussed with patient that I do not recommend starting antihypertensive at this time. Recommend discontinuing phentermine in checking daily blood pressures at home with close PCP follow-up for recheck of her blood pressure. Clinical picture is not consistent with ACS, PE, pneumonia.    Quintella Reichert, MD 12/02/14 [phone]

## 2015-02-17 IMAGING — US US OB DETAIL+14 WK
1 series · 12 of 28 positions shown · non-contrast
Comparison: none

OBSTETRICS REPORT
                      (Signed Final 08/07/2013 [DATE])

Service(s) Provided
 US OB DETAIL + 14 WK                                  76811.0
Indications
 Detailed fetal anatomic survey
 Diabetes - Gestational, A1 (diet controlled)
 Hypertension - Chronic/Pre-existing
Fetal Evaluation
 Num Of Fetuses:    1
 Fetal Heart Rate:  147                          bpm
 Cardiac Activity:  Observed
 Presentation:      Cephalic
 Placenta:          Posterior, above cervical
                    os
 P. Cord            Not well visualized
 Insertion:
 Amniotic Fluid
 AFI FV:      Subjectively within normal limits
 AFI Sum:     19.25   cm       72  %Tile     Larg Pckt:    6.41  cm
 RUQ:   6.41    cm   RLQ:    4.38   cm    LUQ:   3.31    cm   LLQ:    5.15   cm
Biometry
 BPD:     81.6  mm     G. Age:  32w 6d                CI:         75.3   70 - 86
 OFD:    108.4  mm                                    FL/HC:      20.3   19.9 -
 HC:     302.1  mm     G. Age:  33w 4d       19  %    HC/AC:      1.01   0.96 -
 AC:     299.8  mm     G. Age:  34w 0d       67  %    FL/BPD:     75.1   71 - 87
 FL:      61.3  mm     G. Age:  31w 6d        8  %    FL/AC:      20.4   20 - 24
 HUM:     53.7  mm     G. Age:  31w 2d       14  %
 Est. FW:    7999  gm    4 lb 12 oz      54  %
Gestational Age
 LMP:           37w 0d        Date:  11/21/12                 EDD:   08/28/13
 U/S Today:     33w 1d                                        EDD:   09/24/13
 Best:          33w 3d     Det. By:  Early Ultrasound         EDD:   09/22/13
                                     (04/02/13)
Anatomy
 Cranium:          Appears normal         Aortic Arch:      Appears normal
 Fetal Cavum:      Appears normal         Ductal Arch:      Not well visualized
 Ventricles:       Not well visualized    Diaphragm:        Appears normal
 Choroid Plexus:   Not well visualized    Stomach:          Appears normal, left
                                                            sided
 Cerebellum:       Not well visualized    Abdomen:          Appears normal
 Posterior Fossa:  Not well visualized    Abdominal Wall:   Appears nml (cord
                                                            insert, abd wall)
 Nuchal Fold:      Not applicable (>20    Cord Vessels:     Appears normal (3
                   wks GA)                                  vessel cord)
 Face:             Appears normal         Kidneys:          Appear normal
                   (orbits and profile)
 Lips:             Appears normal         Bladder:          Appears normal
 Palate:           Not well visualized    Spine:            Appears normal
 Heart:            Appears normal         Lower             Appears normal
                   (4CH, axis, and        Extremities:
                   situs)
 RVOT:             Not well visualized    Upper             Not well visualized
                                          Extremities:
 LVOT:             Not well visualized
 Other:  Fetus appears to be a female.
Cervix Uterus Adnexa
 Cervix:       Not visualized (advanced GA >33wks)
 Left Ovary:    Not visualized. No adnexal mass visualized.
 Right Ovary:   Not visualized. No adnexal mass visualized.
Myomas
 Site                     L(cm)      W(cm)       D(cm)      Location
 Fundus
 Blood Flow                  RI       PI       Comments
Impression
INDICATION: 22 yr old G1P0 at 33w3d with chroinc
 hypertension and gestational diabetes A1 for fetal anatomic
 survey.

[Series 1: us ob detail+14 wk · 0.26mm/px · 12 of 67 slices shown]
[im 3/67]
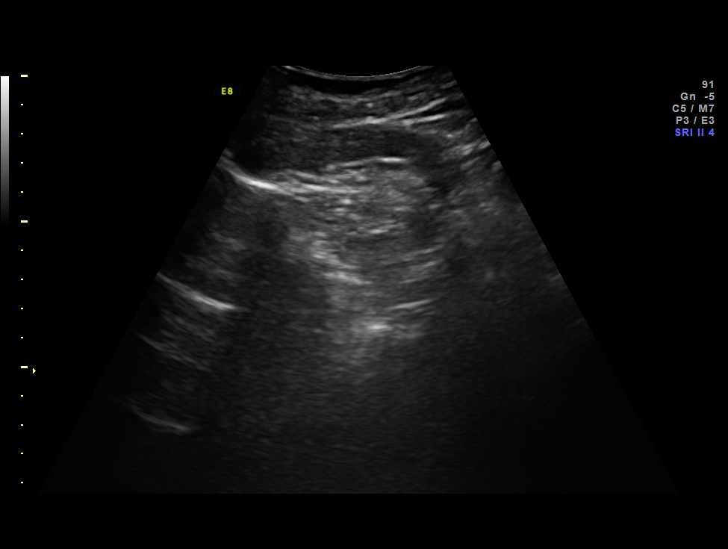
[im 8/67]
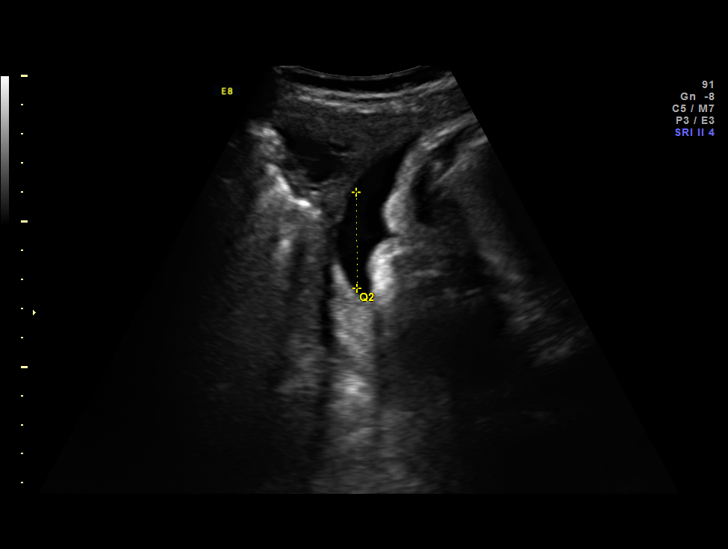
[im 13/67]
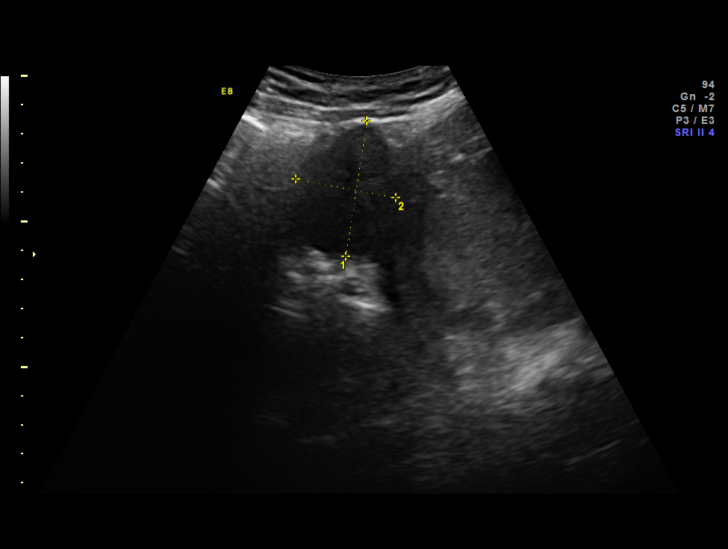
[im 20/67]
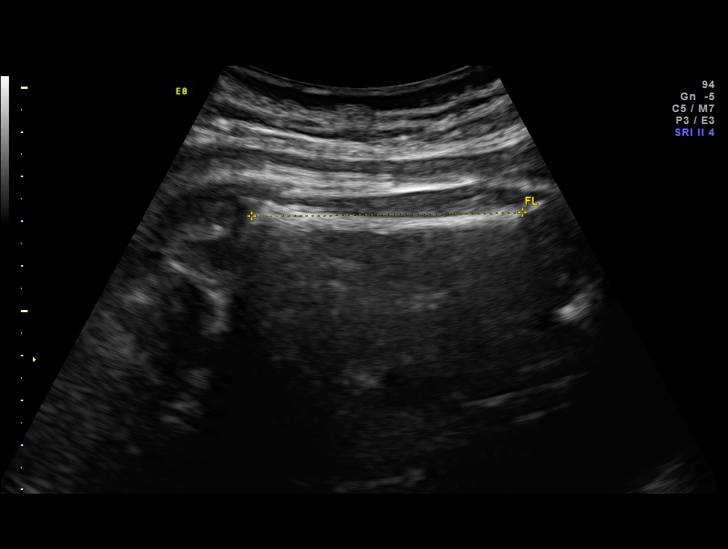
[im 25/67]
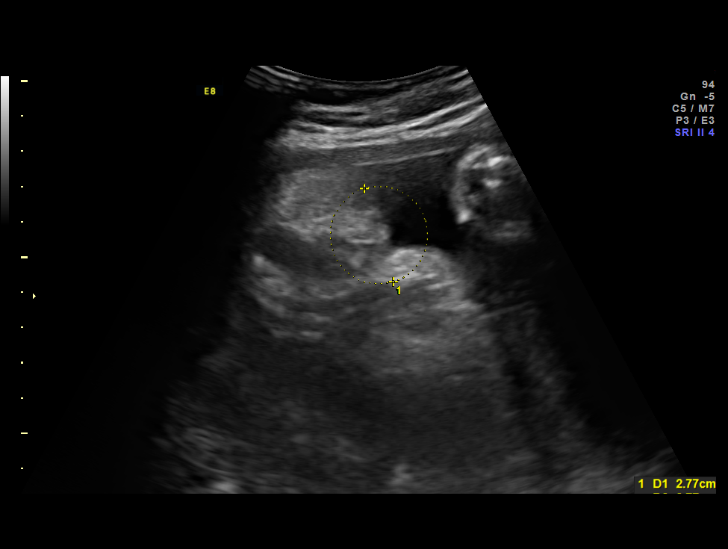
[im 30/67]
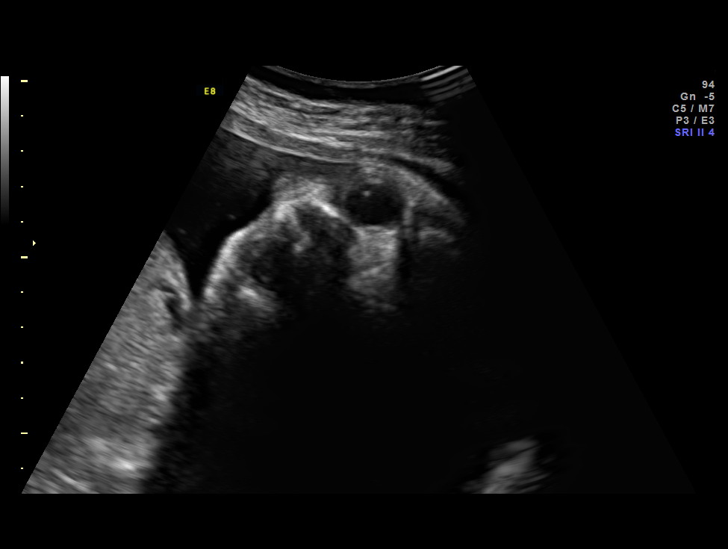
[im 37/67]
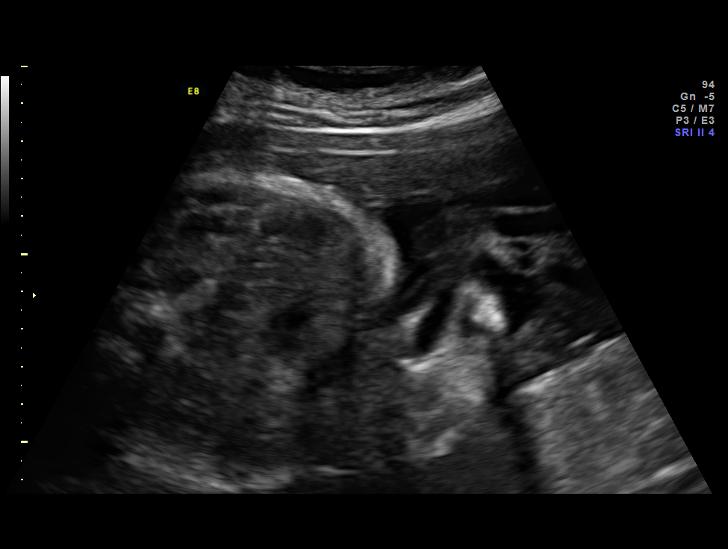
[im 42/67]
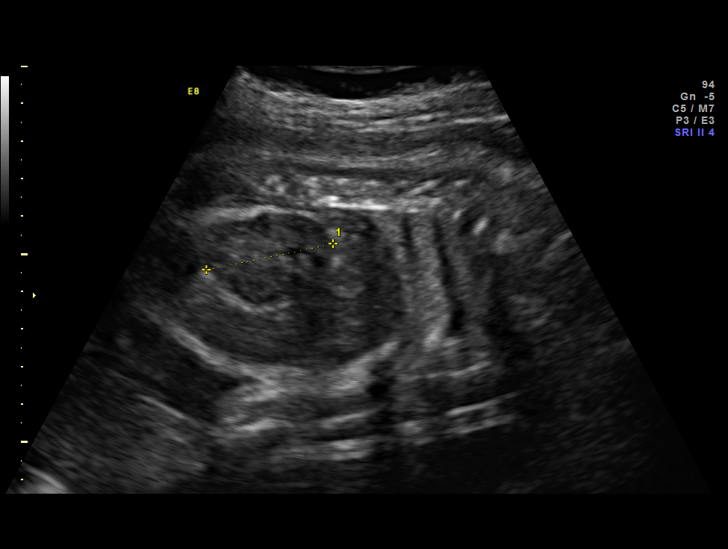
[im 47/67]
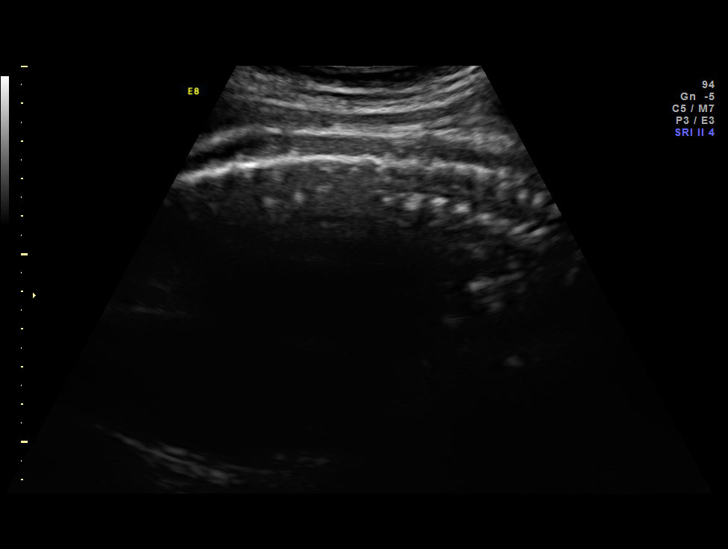
[im 54/67]
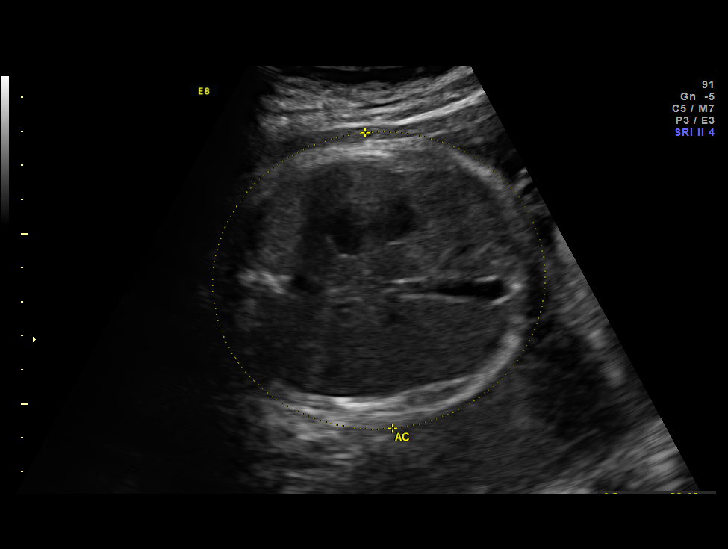
[im 59/67]
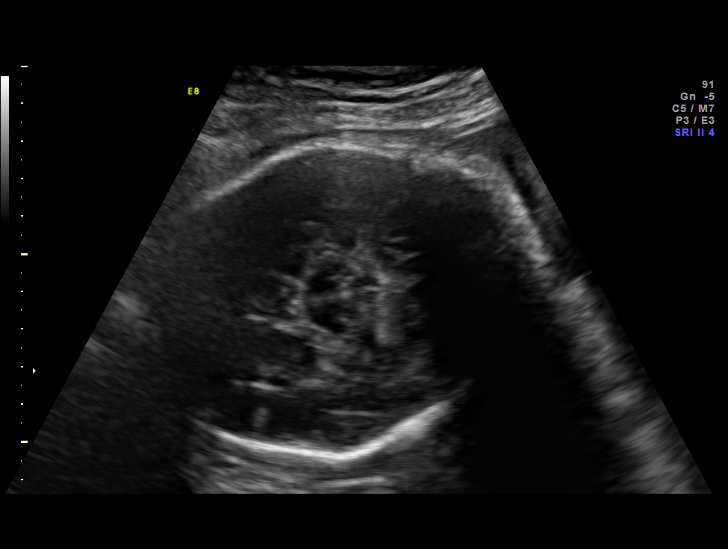
[im 64/67]
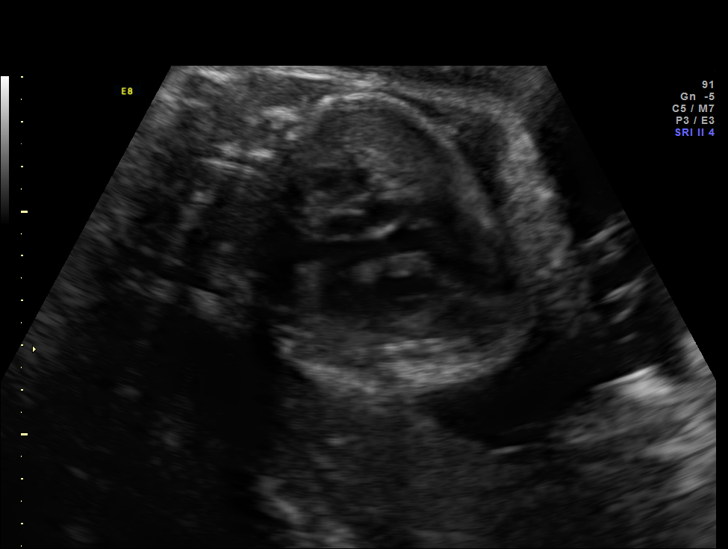

[12 of 28 positions shown; findings below may reference images not displayed]

FINDINGS: 1. Single intrauterine pregnancy.
 2. Estimated fetal weight is in the 54th%.
 3. Posterior placenta without evidence of previa.
 4. Normal amniotic fluid index.
 5. There is a fundal fibroid measuring 1.8x5.8cm.
 6. The fetal anatomic survey is limited by advanced
 gestational age; no abnormalities were seen.
Recommendations

 1. Appropriate fetal growth.
 2. Limited anatomy survey:
 - recommend refer for follow up if would like reattempt at
 anatomy
 3. Chronic hypertension:
 - no consult requested
 - on labetalol
 - recommend fetal growth every 4 weeks
 - recommend continue antenatal testing with twice weekly
 NSTs and weekly AFI
 - recommend close surveillance for the development of
 signs/symptoms of preeclampsia
 - recommend delivery by estimated due date but not prior to
 38 weeks in the absence of other complications
 4. Gestational diabetes:
 - no consult requested
 - currently diet controlled
 - recommend strict glucose control
 - recommend fetal growth and delivery as above
 5. Fibroid:
 - recommend continue to monitor size
 - recommend fetal growth as above

 questions or concerns.

## 2015-06-19 ENCOUNTER — Telehealth: Payer: Self-pay

## 2015-06-19 ENCOUNTER — Institutional Professional Consult (permissible substitution): Payer: Medicaid Other | Admitting: Neurology

## 2015-06-19 NOTE — Telephone Encounter (Signed)
Patient did not show to appt today  

## 2015-06-24 ENCOUNTER — Telehealth (HOSPITAL_COMMUNITY): Payer: Self-pay

## 2015-06-26 ENCOUNTER — Encounter: Payer: Self-pay | Admitting: Neurology

## 2015-07-24 ENCOUNTER — Encounter: Payer: Self-pay | Admitting: Neurology

## 2015-07-24 ENCOUNTER — Ambulatory Visit (INDEPENDENT_AMBULATORY_CARE_PROVIDER_SITE_OTHER): Payer: Medicaid Other | Admitting: Neurology

## 2015-07-24 VITALS — BP 122/83 | HR 77 | Resp 16 | Ht 61.0 in | Wt 152.0 lb

## 2015-07-24 DIAGNOSIS — E663 Overweight: Secondary | ICD-10-CM | POA: Diagnosis not present

## 2015-07-24 DIAGNOSIS — F419 Anxiety disorder, unspecified: Secondary | ICD-10-CM

## 2015-07-24 DIAGNOSIS — R0683 Snoring: Secondary | ICD-10-CM | POA: Diagnosis not present

## 2015-07-24 DIAGNOSIS — G479 Sleep disorder, unspecified: Secondary | ICD-10-CM | POA: Diagnosis not present

## 2015-07-24 DIAGNOSIS — G478 Other sleep disorders: Secondary | ICD-10-CM | POA: Diagnosis not present

## 2015-07-24 DIAGNOSIS — R51 Headache: Secondary | ICD-10-CM

## 2015-07-24 DIAGNOSIS — R519 Headache, unspecified: Secondary | ICD-10-CM

## 2015-07-24 NOTE — Progress Notes (Signed)
Subjective:    Patient ID: Shelly Adams is a 24 y.o. female.  HPI    Star Age, MD, PhD [facility] [address], [address] [place], [place] [address]  Dear Dr. Jimmye Norman,  I saw your patient, Shelly Adams, upon your kind request in my neurologic clinic today for initial consultation of her sleep disorder. The patient is unaccompanied today. Of note, the patient no showed for an appointment on 06/19/2015. As you know, Shelly Adams is a 24 year old right-handed woman with an underlying medical history of hyperlipidemia, asthma, hypertension, kidney stones, anxiety, history of gestational diabetes, and overweight state, who reports non-restorative sleep and daytime sleepiness, as well as snoring.  Her Epworth sleepiness score is 4 out of 24 today, her fatigue score is 54 out of 63. She reports having had blood work through your office which showed no significant abnormalities. I do not have blood test results available for review. She was tried on Vistaril 25 mg at night as needed for insomnia and while it made her sleepy, she did not wake up rested and became more anxious. Ambien was tried, did not help. Of note, she does snore some, per BF. Before she had her daughter, who is now 12 yo, she was treated for her anxiety with clonazepam, which helped her anxiety, but she stopped when she got pregnant.  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 Her bedtime is around 10 PM but she has trouble going to sleep and staying asleep. She has a long-standing history of sleep initiation problems and sleep maintenance problems, these started when she was about 24 years old. She currently has some leftover clonazepam pills, 1 mg strength and takes this infrequently at night as needed. She does report morning headaches frequently approximately 4 times a week but these are less prominent when she is able to sleep. She has noticed that on weekends she  sleeps better and feels better rested. She has occasionally woken herself up with a sense of gasping for air. Her weight has been stable. She denies any frank restless leg symptoms and is not sure if she twitches her legs in her sleep. She does not drink caffeine regularly, drinks alcohol occasionally, and does not smoke. There is a TV in the bedroom but she does not have it on at night. She has residual issues with anxiety. She was trying to get an appointment with her previous psychologist but they no longer take her insurance.  Her Past Medical History Is Significant For: Past Medical History  Diagnosis Date  . Hyperlipemia   . Hypertension   . Recurrent kidney stones   . Insomnia   . Anxiety   . Headache   . Gestational diabetes     Her Past Surgical History Is Significant For: Past Surgical History  Procedure Laterality Date  . Breast reduction surgery  2009    Her Family History Is Significant For: Family History  Problem Relation Age of Onset  . Hypertension Mother   . Hyperlipidemia Mother   . GER disease Mother   . Hyperlipidemia Father   . Hypertension Father     Her Social History Is Significant For: History   Social History  . Marital Status: Single    Spouse Name: N/A  . Number of Children: 1  . Years of Education: College    Occupational History  . Pharmacy Tech     Social History Main Topics  . Smoking status: Never  Smoker   . Smokeless tobacco: Never Used  . Alcohol Use: 0.0 oz/week    0 Standard drinks or equivalent per week     Comment: occasionally   . Drug Use: No  . Sexual Activity: Yes   Other Topics Concern  . None   Social History Narrative   Rarely drinks caffeine beverages     Her Allergies Are:  Allergies  Allergen Reactions  . Tramadol Itching  :   Her Current Medications Are:  Outpatient Encounter Prescriptions as of 07/24/2015  Medication Sig  . albuterol (PROVENTIL HFA;VENTOLIN HFA) 108 (90 BASE) MCG/ACT inhaler Inhale  1 puff into the lungs every 6 (six) hours as needed for wheezing.  . clonazePAM (KLONOPIN) 1 MG tablet Take 1 tab up to four times a week at bedtime as needed for sleep  . glucose blood (BL TEST STRIP PACK) test strip Use as instructed  . Omega-3 Fatty Acids (FISH OIL PO) Take 200 mg by mouth daily.  . propranolol ER (INDERAL LA) 60 MG 24 hr capsule Take 60 mg by mouth.  . [DISCONTINUED] amLODipine (NORVASC) 5 MG tablet Take 1 tablet (5 mg total) by mouth daily.  . [DISCONTINUED] hydrochlorothiazide (HYDRODIURIL) 25 MG tablet Take 1 tablet (25 mg total) by mouth daily.  . [DISCONTINUED] ibuprofen (ADVIL,MOTRIN) 600 MG tablet Take 1 tablet (600 mg total) by mouth every 6 (six) hours.  . [DISCONTINUED] Prenatal Vit-Fe Fumarate-FA (PRENATAL MULTIVITAMIN) TABS tablet Take 1 tablet by mouth daily at 12 noon.  . [DISCONTINUED] zolpidem (AMBIEN) 5 MG tablet Take 1 tablet (5 mg total) by mouth at bedtime as needed for sleep.   Facility-Administered Encounter Medications as of 07/24/2015  Medication  . TDaP (BOOSTRIX) injection 0.5 mL  :  Review of Systems:  Out of a complete 14 point review of systems, all are reviewed and negative with the exception of these symptoms as listed below:  Review of Systems  Constitutional: Positive for fatigue.       Weight gain   Eyes:       Blurred and double vision   Gastrointestinal: Positive for constipation.  Endocrine:       Feeling hot   Musculoskeletal:       Joint pain and swelling   Neurological: Positive for dizziness.       Trouble falling and staying asleep, snoring, wakes up choking in the night, wakes up in the morning feeling tired, morning headaches, daytime tiredness, denies taking naps.   Psychiatric/Behavioral: Positive for confusion.       Anxiety, not enough sleep     Objective:  Neurologic Exam  Physical Exam Physical Examination:   Filed Vitals:   07/24/15 1044  BP: 122/83  Pulse: 77  Resp: 16   General Examination: The  patient is a very pleasant 24 y.o. female in no acute distress. He appears well-developed and well-nourished and very well groomed.   HEENT: Normocephalic, atraumatic, pupils are equal, round and reactive to light and accommodation. Funduscopic exam is normal with sharp disc margins noted. Extraocular tracking is good without limitation to gaze excursion or nystagmus noted. Normal smooth pursuit is noted. Hearing is grossly intact. Tympanic membranes are clear bilaterally. Face is symmetric with normal facial animation and normal facial sensation. Speech is clear with no dysarthria noted. There is no hypophonia. There is no lip, neck/head, jaw or voice tremor. Neck is supple with full range of passive and active motion. There are no carotid bruits on auscultation. Oropharynx exam reveals: mild  mouth dryness, good dental hygiene and mild airway crowding. Mallampati is class II. Tongue protrudes centrally and palate elevates symmetrically. Tonsils are 1+ in size. Neck size is 14 inches.   Chest: Clear to auscultation without wheezing, rhonchi or crackles noted.  Heart: S1+S2+0, regular and normal without murmurs, rubs or gallops noted.   Abdomen: Soft, non-tender and non-distended with normal bowel sounds appreciated on auscultation.  Extremities: There is no pitting edema in the distal lower extremities bilaterally. Pedal pulses are intact.  Skin: Warm and dry without trophic changes noted. There are no varicose veins.  Musculoskeletal: exam reveals no obvious joint deformities, tenderness or joint swelling or erythema.   Neurologically:  Mental status: The patient is awake, alert and oriented in all 4 spheres. Her immediate and remote memory, attention, language skills and fund of knowledge are appropriate. There is no evidence of aphasia, agnosia, apraxia or anomia. Speech is clear with normal prosody and enunciation. Thought process is linear. Mood is normal and affect is normal.  Cranial nerves II  - XII are as described above under HEENT exam. In addition: shoulder shrug is normal with equal shoulder height noted. Motor exam: Normal bulk, strength and tone is noted. There is no drift, tremor or rebound. Romberg is negative. Reflexes are 2+ throughout. Babinski: Toes are flexor bilaterally. Fine motor skills and coordination: intact with normal finger taps, normal hand movements, normal rapid alternating patting, normal foot taps and normal foot agility.  Cerebellar testing: No dysmetria or intention tremor on finger to nose testing. Heel to shin is unremarkable bilaterally. There is no truncal or gait ataxia.  Sensory exam: intact to light touch, pinprick, vibration, temperature sense in the upper and lower extremities.  Gait, station and balance: She stands easily. No veering to one side is noted. No leaning to one side is noted. Posture is age-appropriate and stance is narrow based. Gait shows normal stride length and normal pace. No problems turning are noted. She turns en bloc. Tandem walk is unremarkable.   Assessment and Plan:   In summary, Caili Escalera is a very pleasant 24 y.o.-year old female with an underlying medical history of hyperlipidemia, asthma, hypertension, kidney stones, anxiety, history of gestational diabetes, and overweight state, who reports non-restorative sleep and daytime sleepiness, as well as snoring.  While not telltale, she does have a history and physical exam concerning for underlying obstructive sleep apnea (OSA). She is currently not taking clonazepam frequently and is advised not to take it until we can do a sleep study. We talked about sleep hygiene today. We talked about chronic sleep maintenance and sleep initiation problems and potential treatment options. She is aware that daily prescription sleeping pills are not recommended.  I had a long chat with the patient about my findings and the diagnosis of OSA, its prognosis and treatment options. We talked about  medical treatments, surgical interventions and non-pharmacological approaches. I explained in particular the risks and ramifications of untreated moderate to severe OSA, especially with respect to developing cardiovascular disease down the Road, including congestive heart failure, difficult to treat hypertension, cardiac arrhythmias, or stroke. Even type 2 diabetes has, in part, been linked to untreated OSA. Symptoms of untreated OSA include daytime sleepiness, memory problems, mood irritability and mood disorder such as depression and anxiety, lack of energy, as well as recurrent headaches, especially morning headaches. We talked about trying to maintain a healthy lifestyle in general, as well as the importance of weight control. I encouraged the patient to eat  healthy, exercise daily and keep well hydrated, to keep a scheduled bedtime and wake time routine, to not skip any meals and eat healthy snacks in between meals. I advised the patient not to drive when feeling sleepy. I recommended the following at this time: sleep study with potential positive airway pressure titration. (We will score hypopneas at 4% and split the sleep study into diagnostic and treatment portion, if the estimated. 2 hour AHI is >15/h).   I explained the sleep test procedure to the patient and also outlined possible surgical and non-surgical treatment options of OSA, including the use of a custom-made dental device (which would require a referral to a specialist dentist or oral surgeon), upper airway surgical options, such as pillar implants, radiofrequency surgery, tongue base surgery, and UPPP (which would involve a referral to an ENT surgeon). Rarely, jaw surgery such as mandibular advancement may be considered.  I answered all her questions today and the patient was in agreement. I would like to see her back after the sleep study is completed and encouraged her to call with any interim questions, concerns, problems or updates.    Thank you very much for allowing me to participate in the care of this nice patient. If I can be of any further assistance to you please do not hesitate to call me at [phone].  Sincerely,   Star Age, MD, PhD

## 2015-07-24 NOTE — Patient Instructions (Addendum)
Based on your symptoms and your exam I believe you are at risk for obstructive sleep apnea or OSA, and I think we should proceed with a sleep study to determine whether you do or do not have OSA and how severe it is. If you have more than mild OSA, I want you to consider treatment with CPAP. Please remember, the risks and ramifications of moderate to severe obstructive sleep apnea or OSA are: Cardiovascular disease, including congestive heart failure, stroke, difficult to control hypertension, arrhythmias, and even type 2 diabetes has been linked to untreated OSA. Sleep apnea causes disruption of sleep and sleep deprivation in most cases, which, in turn, can cause recurrent headaches, problems with memory, mood, concentration, focus, and vigilance. Most people with untreated sleep apnea report excessive daytime sleepiness, which can affect their ability to drive. Please do not drive if you feel sleepy.   I will likely see you back after your sleep study to go over the test results and where to go from there. We will call you after your sleep study to advise about the results (most likely, you will hear from Beverlee Nims, my nurse) and to set up an appointment at the time, as necessary.    Our sleep lab administrative assistant, Arrie Aran will meet with you or call you to schedule your sleep study. If you don't hear back from her by next week please feel free to call her at [phone]. This is her direct line and please leave a message with your phone number to call back if you get the voicemail box. She will call back as soon as possible.   Please remember to try to maintain good sleep hygiene, which means: Keep a regular sleep and wake schedule, try not to exercise or have a meal within 2 hours of your bedtime, try to keep your bedroom conducive for sleep, that is, cool and dark, without light distractors such as an illuminated alarm clock, and refrain from watching TV right before sleep or in the middle of the night  and do not keep the TV or radio on during the night. Also, try not to use or play on electronic devices at bedtime, such as your cell phone, tablet PC or laptop. If you like to read at bedtime on an electronic device, try to dim the background light as much as possible. Do not eat in the middle of the night.   Try to not take your clonazepam for now.

## 2016-04-14 DIAGNOSIS — J301 Allergic rhinitis due to pollen: Secondary | ICD-10-CM | POA: Insufficient documentation

## 2016-04-19 DIAGNOSIS — F909 Attention-deficit hyperactivity disorder, unspecified type: Secondary | ICD-10-CM | POA: Insufficient documentation

## 2016-07-01 ENCOUNTER — Emergency Department (HOSPITAL_BASED_OUTPATIENT_CLINIC_OR_DEPARTMENT_OTHER)
Admission: EM | Admit: 2016-07-01 | Discharge: 2016-07-01 | Disposition: A | Discharge status: Home / Self Care | Payer: Medicaid Other | Attending: Emergency Medicine | Admitting: Emergency Medicine

## 2016-07-01 ENCOUNTER — Encounter (HOSPITAL_BASED_OUTPATIENT_CLINIC_OR_DEPARTMENT_OTHER): Payer: Self-pay

## 2016-07-01 DIAGNOSIS — Z79899 Other long term (current) drug therapy: Secondary | ICD-10-CM | POA: Insufficient documentation

## 2016-07-01 DIAGNOSIS — R110 Nausea: Secondary | ICD-10-CM | POA: Diagnosis not present

## 2016-07-01 DIAGNOSIS — E785 Hyperlipidemia, unspecified: Secondary | ICD-10-CM | POA: Diagnosis not present

## 2016-07-01 DIAGNOSIS — I10 Essential (primary) hypertension: Secondary | ICD-10-CM | POA: Insufficient documentation

## 2016-07-01 DIAGNOSIS — R1013 Epigastric pain: Secondary | ICD-10-CM | POA: Diagnosis not present

## 2016-07-01 LAB — URINE MICROSCOPIC-ADD ON

## 2016-07-01 LAB — URINALYSIS, ROUTINE W REFLEX MICROSCOPIC
Bilirubin Urine: NEGATIVE
Glucose, UA: NEGATIVE mg/dL
Ketones, ur: 15 mg/dL — AB
Leukocytes, UA: NEGATIVE
Nitrite: NEGATIVE
Protein, ur: NEGATIVE mg/dL
Specific Gravity, Urine: 1.025 (ref 1.005–1.030)
pH: 6.5 (ref 5.0–8.0)

## 2016-07-01 LAB — PREGNANCY, URINE: Preg Test, Ur: NEGATIVE

## 2016-07-01 MED ORDER — FAMOTIDINE 20 MG PO TABS
20.0000 mg | ORAL_TABLET | Freq: Once | ORAL | Status: AC
  Administered 2016-07-01: 20 mg via ORAL
  Filled 2016-07-01: qty 1

## 2016-07-01 MED ORDER — GI COCKTAIL ~~LOC~~
30.0000 mL | Freq: Once | ORAL | Status: AC
  Administered 2016-07-01: 30 mL via ORAL
  Filled 2016-07-01: qty 30

## 2016-07-01 MED ORDER — PANTOPRAZOLE SODIUM 20 MG PO TBEC
20.0000 mg | DELAYED_RELEASE_TABLET | Freq: Once | ORAL | Status: DC
  Filled 2016-07-01: qty 1

## 2016-07-01 MED ORDER — SUCRALFATE 1 G PO TABS: 1.0000 g | ORAL_TABLET | Freq: Three times a day (TID) | ORAL | Status: DC

## 2016-07-01 MED ORDER — PANTOPRAZOLE SODIUM 20 MG PO TBEC: 20.0000 mg | DELAYED_RELEASE_TABLET | Freq: Every day | ORAL | Status: DC

## 2016-07-01 NOTE — ED Provider Notes (Signed)
CSN: [id number]     Arrival date & time 07/01/16  1815 History  By signing my name below, I, Shelly Adams, attest that this documentation has been prepared under the direction and in the presence of [employer], PA-C.  Electronically Signed: Eustaquio Adams, ED Scribe. 07/01/2016. 6:37 PM.   Chief Complaint  Patient presents with  . Abdominal Pain   The history is provided by the patient. No language interpreter was used.   HPI Comments: Shelly Adams is a 25 y.o. female with PMHx HLD and HTN, who presents to the Emergency Department complaining of gradual onset, intermittent, epigastric abdominal pain radiating to mid back x 1 week. The pain presents every time after eating food but pt does not see a pattern in the type of food. The pain lasts 1-2 hours after eating and then resolves on its own. It is not exacerbated with laying down. Pt also complains of nausea. She has not taken anything for the pain. No hx ulcers. Pt has hx of reflux but does not think this pain feels similar. The pain does not radiate up into her throat. No excessive EtOH usage or NSAID usage. Denies vomiting, chest pain,  or any other associated symptoms.    Past Medical History  Diagnosis Date  . Hyperlipemia   . Hypertension   . Recurrent kidney stones   . Insomnia   . Anxiety   . Headache   . Gestational diabetes    Past Surgical History  Procedure Laterality Date  . Breast reduction surgery  2009   Family History  Problem Relation Age of Onset  . Hypertension Mother   . Hyperlipidemia Mother   . GER disease Mother   . Hyperlipidemia Father   . Hypertension Father    Social History  Substance Use Topics  . Smoking status: Never Smoker   . Smokeless tobacco: Never Used  . Alcohol Use: No     Comment: occasionally    OB History    Gravida Para Term Preterm AB TAB SAB Ectopic Multiple Living   1 1 1       1      Review of Systems A complete 10 system review of systems was obtained and all  systems are negative except as noted in the HPI and PMH.   Allergies  Tramadol  Home Medications   Prior to Admission medications   Medication Sig Start Date End Date Taking? Authorizing Provider  albuterol (PROVENTIL HFA;VENTOLIN HFA) 108 (90 BASE) MCG/ACT inhaler Inhale 1 puff into the lungs every 6 (six) hours as needed for wheezing.    Historical Provider, MD  clonazePAM (KLONOPIN) 1 MG tablet Take 1 tab up to four times a week at bedtime as needed for sleep 05/12/15   Historical Provider, MD  glucose blood (BL TEST STRIP PACK) test strip Use as instructed 08/08/13   Woodroe Mode, MD  propranolol ER (INDERAL LA) 60 MG 24 hr capsule Take 60 mg by mouth. 05/12/15 05/06/16  Historical Provider, MD   BP 134/92 mmHg  Pulse 93  Temp(Src) 98.8 F (37.1 C) (Oral)  Resp 16  Ht 5' 1"  (1.549 m)  Wt 157 lb (71.215 kg)  BMI 29.68 kg/m2  SpO2 99%  LMP 06/26/2016   Physical Exam  Constitutional: She is oriented to person, place, and time. She appears well-developed and well-nourished. No distress.  HENT:  Head: Normocephalic and atraumatic.  Eyes: Conjunctivae and EOM are normal.  Neck: Neck supple. No tracheal deviation present.  Cardiovascular:  Normal rate, regular rhythm and normal heart sounds.   Pulmonary/Chest: Effort normal and breath sounds normal. No respiratory distress. She has no wheezes. She has no rales.  Abdominal: Soft. Bowel sounds are normal. There is no tenderness. There is negative Murphy's sign.  Musculoskeletal: Normal range of motion.  Neurological: She is alert and oriented to person, place, and time.  Skin: Skin is warm and dry.  Psychiatric: She has a normal mood and affect. Her behavior is normal.  Nursing note and vitals reviewed.   ED Course  Procedures (including critical care time)  DIAGNOSTIC STUDIES: Oxygen Saturation is 99% on RA, normal by my interpretation.    COORDINATION OF CARE: 6:35 PM-Discussed treatment plan which includes GI cocktail and  follow up with GI with pt at bedside and pt agreed to plan.   7:24 PM- Reassessed patient. She reports she is still having discomfort in the epigastrium. Abdomen soft, non tender on exam.  Labs Review Labs Reviewed  PREGNANCY, URINE  URINALYSIS, ROUTINE W REFLEX MICROSCOPIC (NOT AT [facility])    Imaging Review No results found. I have personally reviewed and evaluated these images and lab results as part of my medical decision-making.   EKG Interpretation None      MDM   Final diagnoses:  None   Patient is nontoxic, nonseptic appearing, in no apparent distress.  Patient's pain and other symptoms adequately managed in emergency department. Vitals reviewed.   Abdomen soft and non tender on exam.  On repeat exam patient also does not have a surgical abdomen and there are no peritoneal signs.  No indication of appendicitis, bowel obstruction, bowel perforation, cholecystitis, diverticulitis, PID or ectopic pregnancy.  Therefore, do not feel that imaging is indicated.  Suspect GERD or PUD.  Patient discharged home with symptomatic treatment and given strict instructions for follow-up with GI should symptoms persist.  Given Rx for Protonix and Carafate.  I have also discussed reasons to return immediately to the ER.  Patient expresses understanding and agrees with plan.   I personally performed the services described in this documentation, which was scribed in my presence. The recorded information has been reviewed and is accurate.      Shelly Bible, PA-C 07/02/16 1224  Shelly Dakin, MD 07/02/16 1501

## 2016-07-01 NOTE — ED Notes (Signed)
C/o abd pain, nausea after eating x 1 week-NAD-steady gait

## 2016-07-01 NOTE — ED Notes (Signed)
MD at bedside to evaluate patient.

## 2016-07-05 ENCOUNTER — Encounter: Payer: Self-pay | Admitting: Internal Medicine

## 2016-07-05 ENCOUNTER — Ambulatory Visit (INDEPENDENT_AMBULATORY_CARE_PROVIDER_SITE_OTHER): Payer: Medicaid Other | Admitting: Internal Medicine

## 2016-07-05 ENCOUNTER — Other Ambulatory Visit (INDEPENDENT_AMBULATORY_CARE_PROVIDER_SITE_OTHER): Payer: Medicaid Other

## 2016-07-05 VITALS — BP 116/68 | HR 72 | Ht 61.0 in | Wt 157.0 lb

## 2016-07-05 DIAGNOSIS — G43A0 Cyclical vomiting, not intractable: Secondary | ICD-10-CM

## 2016-07-05 DIAGNOSIS — R1013 Epigastric pain: Secondary | ICD-10-CM

## 2016-07-05 DIAGNOSIS — R1115 Cyclical vomiting syndrome unrelated to migraine: Secondary | ICD-10-CM

## 2016-07-05 DIAGNOSIS — E781 Pure hyperglyceridemia: Secondary | ICD-10-CM

## 2016-07-05 LAB — COMPREHENSIVE METABOLIC PANEL
ALBUMIN: 4.7 g/dL (ref 3.5–5.2)
ALK PHOS: 80 U/L (ref 39–117)
ALT: 25 U/L (ref 0–35)
AST: 20 U/L (ref 0–37)
BUN: 12 mg/dL (ref 6–23)
CO2: 29 mEq/L (ref 19–32)
Calcium: 10 mg/dL (ref 8.4–10.5)
Chloride: 103 mEq/L (ref 96–112)
Creatinine, Ser: 0.82 mg/dL (ref 0.40–1.20)
GFR: 89.45 mL/min (ref >60.00)
Glucose, Bld: 99 mg/dL (ref 70–99)
Potassium: 4.5 mEq/L (ref 3.5–5.1)
SODIUM: 137 meq/L (ref 135–145)
TOTAL PROTEIN: 8.1 g/dL (ref 6.0–8.3)
Total Bilirubin: 0.4 mg/dL (ref 0.2–1.2)

## 2016-07-05 LAB — LIPASE: LIPASE: 19 U/L (ref 11.0–59.0)

## 2016-07-05 LAB — AMYLASE: Amylase: 48 U/L (ref 27–131)

## 2016-07-05 NOTE — Patient Instructions (Addendum)
   Your physician has requested that you go to the basement for the following lab work before leaving today: Amylase, Lipase, CMET  You have been scheduled for an abdominal ultrasound at [facility] (1st floor of hospital) on 07/07/16 at 9:30AM. Please arrive 15 minutes prior to your appointment for registration. Make certain not to have anything to eat or drink 6 hours prior to your appointment. Should you need to reschedule your appointment, please contact radiology at [phone]. This test typically takes about 30 minutes to perform.  Today you have been given handouts to read on Gallstones and gallbladder removal and a low fat diet.  I appreciate the opportunity to care for you. Silvano Rusk, MD, [facility]

## 2016-07-05 NOTE — Progress Notes (Signed)
Referred by: [place], MD [place], [place] [id number]  Subjective:  Cc: epigastric pain  Patient ID: Shelly Adams, female    DOB: [birth year], 25 y.o.   MRN: [id number]  HPI [name] w/ several week hx worsening intermittent post-prandial epigastric pain that radiates to interscapular reguon in back. Usually x hrs and 1-2 hrs after eating, especially fatty foods. Has had some associated nausea and vomiting. Was seen in an ED and no labs done but PPI + carafate Rx. No help. Does not have heartburn and no bowel sxs.  Wt Readings from Last 3 Encounters:  07/05/16 157 lb (71.215 kg)  07/01/16 157 lb (71.215 kg)  07/24/15 152 lb (68.947 kg)    Allergies  Allergen Reactions  . Tramadol Itching   Outpatient Prescriptions Prior to Visit  Medication Sig Dispense Refill  . albuterol (PROVENTIL HFA;VENTOLIN HFA) 108 (90 BASE) MCG/ACT inhaler Inhale 1 puff into the lungs every 6 (six) hours as needed for wheezing.    . clonazePAM (KLONOPIN) 1 MG tablet Take 1 tab up to four times a week at bedtime as needed for sleep    . pantoprazole (PROTONIX) 20 MG tablet Take 1 tablet (20 mg total) by mouth daily. 30 tablet 0  . sucralfate (CARAFATE) 1 g tablet Take 1 tablet (1 g total) by mouth 4 (four) times daily -  with meals and at bedtime. 40 tablet 0  . glucose blood (BL TEST STRIP PACK) test strip Use as instructed 100 each 12  . propranolol ER (INDERAL LA) 60 MG 24 hr capsule Take 60 mg by mouth.     Facility-Administered Medications Prior to Visit  Medication Dose Route Frequency Provider Last Rate Last Dose  . TDaP (BOOSTRIX) injection 0.5 mL  0.5 mL Intramuscular Once Woodroe Mode, MD       Past Medical History  Diagnosis Date  . Hyperlipemia   . Hypertension   . Recurrent kidney stones   . Insomnia   . Anxiety   . Headache   . Gestational diabetes    Past Surgical History  Procedure Laterality Date  . Breast reduction surgery   2009   Social History   Social History  . Marital Status: Single    Spouse Name: N/A  . Number of Children: 1  . Years of Education: College    Occupational History  . Pharmacy Tech     Social History Main Topics  . Smoking status: Never Smoker   . Smokeless tobacco: Never Used  . Alcohol Use: No     Comment: occasionally   . Drug Use: No  . Sexual Activity: Not Asked   Other Topics Concern  . None   Social History Narrative   Rarely drinks caffeine beverages    Family History  Problem Relation Age of Onset  . Hypertension Mother   . Hyperlipidemia Mother   . GER disease Mother   . Hyperlipidemia Father   . Hypertension Father   . Colon cancer Neg Hx         Review of Systems Going to school busy w/ child - elevated TG's but not on Tx yet    Objective:   Physical Exam @BP  116/68 mmHg  Pulse 72  Ht 5' 1"  (1.549 m)  Wt 157 lb (71.215 kg)  BMI 29.68 kg/m2  LMP 06/26/2016@  General:  Well-developed, well-nourished and in no acute distress Eyes:  anicteric. Neck:   supple w/o thyromegaly or  mass.  Lungs: Clear to auscultation bilaterally. Heart:  S1S2, no rubs, murmurs, gallops. Abdomen:  soft, non-tender, no hepatosplenomegaly, hernia, or mass and BS+.  Lymph:  no cervical or supraclavicular adenopathy. Extremities:   no edema, cyanosis or clubbing Skin   no rash. Neuro:  A&O x 3.  Psych:  appropriate mood and  Affect.   Data reviewed: recent PCP note and labs (Care Everywhere)  April 2017 TG 493 Tchol 254 HDL 26    Assessment & Plan:   Encounter Diagnoses  Name Primary?  . Abdominal pain, epigastric Yes  . Non-intractable cyclical vomiting with nausea   . Hypertriglyceridemia     Sounds like symptomatic cholelithiasis. CMET, amylase, lipase and abdominal US If gallstones - [facility] consult Handouts given Low fat diet F/U PCP re: Tx increased TG's  Lab Results  Component Value Date   AMYLASE 48 07/05/2016   Lab Results  Component Value  Date   LIPASE 19.0 07/05/2016   Lab Results  Component Value Date   ALT 25 07/05/2016   AST 20 07/05/2016   ALKPHOS 80 07/05/2016   BILITOT 0.4 07/05/2016    [place] was negative so will do a HIDA w/ EF ZJ:GJGM, Dola Factor, MD

## 2016-07-07 ENCOUNTER — Ambulatory Visit (HOSPITAL_COMMUNITY)
Admission: RE | Admit: 2016-07-07 | Discharge: 2016-07-07 | Disposition: A | Discharge status: Home / Self Care | Payer: Medicaid Other | Source: Ambulatory Visit | Attending: Internal Medicine | Admitting: Internal Medicine

## 2016-07-07 DIAGNOSIS — G43A0 Cyclical vomiting, not intractable: Secondary | ICD-10-CM | POA: Diagnosis not present

## 2016-07-07 DIAGNOSIS — R1115 Cyclical vomiting syndrome unrelated to migraine: Secondary | ICD-10-CM

## 2016-07-07 DIAGNOSIS — R1013 Epigastric pain: Secondary | ICD-10-CM | POA: Diagnosis present

## 2016-07-07 DIAGNOSIS — K760 Fatty (change of) liver, not elsewhere classified: Secondary | ICD-10-CM | POA: Diagnosis not present

## 2016-07-07 NOTE — Progress Notes (Signed)
Quick Note:  The labs and the ultrasound are ok except for fatty liver - extra fat in liver suspected - more common in Hispanics.  Definitely need to get back to PCP for treatment of high triglycerides and cholesterol  Order a HIDA scan + ejection fraction to evaluate epigastric pain please ______

## 2016-07-08 ENCOUNTER — Other Ambulatory Visit: Payer: Self-pay

## 2016-07-08 DIAGNOSIS — R1013 Epigastric pain: Secondary | ICD-10-CM

## 2016-07-10 ENCOUNTER — Encounter: Payer: Self-pay | Admitting: Internal Medicine

## 2016-07-22 ENCOUNTER — Ambulatory Visit (HOSPITAL_COMMUNITY): Admission: RE | Admit: 2016-07-22 | Payer: Medicaid Other | Source: Ambulatory Visit

## 2016-10-17 LAB — OB RESULTS CONSOLE ABO/RH: RH Type: POSITIVE

## 2016-10-17 LAB — OB RESULTS CONSOLE RPR: RPR: NONREACTIVE

## 2016-10-17 LAB — CYSTIC FIBROSIS DIAGNOSTIC STUDY: Interpretation-CFDNA:: NEGATIVE

## 2016-10-17 LAB — OB RESULTS CONSOLE GC/CHLAMYDIA
Chlamydia: NEGATIVE
GC PROBE AMP, GENITAL: NEGATIVE

## 2016-10-17 LAB — OB RESULTS CONSOLE VARICELLA ZOSTER ANTIBODY, IGG: Varicella: IMMUNE

## 2016-10-17 LAB — OB RESULTS CONSOLE ANTIBODY SCREEN: Antibody Screen: NEGATIVE

## 2016-10-17 LAB — SICKLE CELL SCREEN: Sickle Cell Screen: NORMAL

## 2016-10-17 LAB — OB RESULTS CONSOLE HGB/HCT, BLOOD
HEMATOCRIT: 38 %
HEMOGLOBIN: 13.1 g/dL

## 2016-10-17 LAB — GLUCOSE TOLERANCE, 1 HOUR: GLUCOSE 1 HOUR GTT: 99

## 2016-10-17 LAB — OB RESULTS CONSOLE PLATELET COUNT: Platelets: 198 10*3/uL

## 2016-10-17 LAB — OB RESULTS CONSOLE RUBELLA ANTIBODY, IGM: Rubella: IMMUNE

## 2016-10-17 LAB — OB RESULTS CONSOLE HIV ANTIBODY (ROUTINE TESTING): HIV: NONREACTIVE

## 2016-10-17 LAB — OB RESULTS CONSOLE HEPATITIS B SURFACE ANTIGEN: Hepatitis B Surface Ag: NEGATIVE

## 2016-10-17 LAB — CYTOLOGY - PAP: Pap: NEGATIVE

## 2016-10-31 ENCOUNTER — Encounter: Payer: Self-pay | Admitting: General Practice

## 2016-10-31 ENCOUNTER — Encounter: Payer: Self-pay | Admitting: Family Medicine

## 2016-10-31 ENCOUNTER — Ambulatory Visit (INDEPENDENT_AMBULATORY_CARE_PROVIDER_SITE_OTHER): Payer: Medicaid Other | Admitting: Family Medicine

## 2016-10-31 DIAGNOSIS — K7581 Nonalcoholic steatohepatitis (NASH): Secondary | ICD-10-CM | POA: Insufficient documentation

## 2016-10-31 DIAGNOSIS — O09292 Supervision of pregnancy with other poor reproductive or obstetric history, second trimester: Secondary | ICD-10-CM

## 2016-10-31 DIAGNOSIS — O0992 Supervision of high risk pregnancy, unspecified, second trimester: Secondary | ICD-10-CM

## 2016-10-31 DIAGNOSIS — O09299 Supervision of pregnancy with other poor reproductive or obstetric history, unspecified trimester: Secondary | ICD-10-CM

## 2016-10-31 DIAGNOSIS — O0990 Supervision of high risk pregnancy, unspecified, unspecified trimester: Secondary | ICD-10-CM | POA: Insufficient documentation

## 2016-10-31 DIAGNOSIS — K760 Fatty (change of) liver, not elsewhere classified: Secondary | ICD-10-CM | POA: Insufficient documentation

## 2016-10-31 DIAGNOSIS — O10019 Pre-existing essential hypertension complicating pregnancy, unspecified trimester: Secondary | ICD-10-CM

## 2016-10-31 HISTORY — DX: Fatty (change of) liver, not elsewhere classified: K76.0

## 2016-10-31 LAB — POCT URINALYSIS DIP (DEVICE)
BILIRUBIN URINE: NEGATIVE
Glucose, UA: NEGATIVE mg/dL
Ketones, ur: NEGATIVE mg/dL
NITRITE: NEGATIVE
PH: 7 (ref 5.0–8.0)
PROTEIN: NEGATIVE mg/dL
Specific Gravity, Urine: 1.015 (ref 1.005–1.030)
Urobilinogen, UA: 0.2 mg/dL (ref 0.0–1.0)

## 2016-10-31 MED ORDER — ASPIRIN EC 81 MG PO TBEC: 81.0000 mg | DELAYED_RELEASE_TABLET | Freq: Every day | ORAL | 3 refills | Status: DC

## 2016-10-31 NOTE — Progress Notes (Signed)
   PRENATAL VISIT NOTE  Subjective:  Shelly Adams is a 25 y.o. G2P1001 at 53w0dbeing seen today for transferring prenatal care. [facility] to [facility]ol. Has asthma--some increased wheezing lately  She is currently monitored for the following issues for this high-risk pregnancy and has Benign essential hypertension, antepartum; Fibroid, uterine; Asthma; History of gestational diabetes in prior pregnancy, currently pregnant; Supervision of high-risk pregnancy; and NASH (nonalcoholic steatohepatitis) on her problem list.  Patient reports no complaints.  Contractions: Not present. Vag. Bleeding: None.  Movement: Present. Denies leaking of fluid.   The following portions of the patient's history were reviewed and updated as appropriate: allergies, current medications, past family history, past medical history, past social history, past surgical history and problem list. Problem list updated.  Objective:   Vitals:   10/31/16 0833  BP: 120/80  Pulse: 94  Weight: 145 lb (65.8 kg)    Fetal Status: Fetal Heart Rate (bpm): 140   Movement: Present     General:  Alert, oriented and cooperative. Patient is in no acute distress.  Skin: Skin is warm and dry. No rash noted.   Cardiovascular: Normal heart rate noted  Respiratory: Normal respiratory effort, no problems with respiration noted  Abdomen: Soft, gravid, appropriate for gestational age. Pain/Pressure: Present     Pelvic:  Cervical exam deferred        Extremities: Normal range of motion.  Edema: None  Mental Status: Normal mood and affect. Normal behavior. Normal judgment and thought content.   Assessment and Plan:  Pregnancy: G2P1001 at 114w0d1. History of pre-eclampsia in prior pregnancy, currently pregnant Begin Baby ASA   2. Supervision of high risk pregnancy in second trimester Anatomy u/s scheduled. - [place] OB COMP + 14 WK; Future  3. Benign essential hypertension,  antepartum Continue Labetalol, ASA, normal baseline labs. - aspirin EC 81 MG tablet; Take 1 tablet (81 mg total) by mouth daily.  Dispense: 90 tablet; Refill: 3   General obstetric precautions including but not limited to vaginal bleeding, contractions, leaking of fluid and fetal movement were reviewed in detail with the patient. Please refer to After Visit Summary for other counseling recommendations.  Return in about 4 weeks (around 11/28/2016).   TaDonnamae JudeMD

## 2016-10-31 NOTE — Patient Instructions (Addendum)
Breastfeeding Deciding to breastfeed is one of the best choices you can make for you and your baby. A change in hormones during pregnancy causes your breast tissue to grow and increases the number and size of your milk ducts. These hormones also allow proteins, sugars, and fats from your blood supply to make breast milk in your milk-producing glands. Hormones prevent breast milk from being released before your baby is born as well as prompt milk flow after birth. Once breastfeeding has begun, thoughts of your baby, as well as his or her sucking or crying, can stimulate the release of milk from your milk-producing glands.  BENEFITS OF BREASTFEEDING For Your Baby  Your first milk (colostrum) helps your baby's digestive system function better.  There are antibodies in your milk that help your baby fight off infections.  Your baby has a lower incidence of asthma, allergies, and sudden infant death syndrome.  The nutrients in breast milk are better for your baby than infant formulas and are designed uniquely for your baby's needs.  Breast milk improves your baby's brain development.  Your baby is less likely to develop other conditions, such as childhood obesity, asthma, or type 2 diabetes mellitus. For You  Breastfeeding helps to create a very special bond between you and your baby.  Breastfeeding is convenient. Breast milk is always available at the correct temperature and costs nothing.  Breastfeeding helps to burn calories and helps you lose the weight gained during pregnancy.  Breastfeeding makes your uterus contract to its prepregnancy size faster and slows bleeding (lochia) after you give birth.   Breastfeeding helps to lower your risk of developing type 2 diabetes mellitus, osteoporosis, and breast or ovarian cancer later in life. SIGNS THAT YOUR BABY IS HUNGRY Early Signs of Hunger  Increased alertness or activity.  Stretching.  Movement of the head from side to  side.  Movement of the head and opening of the mouth when the corner of the mouth or cheek is stroked (rooting).  Increased sucking sounds, smacking lips, cooing, sighing, or squeaking.  Hand-to-mouth movements.  Increased sucking of fingers or hands. Late Signs of Hunger  Fussing.  Intermittent crying. Extreme Signs of Hunger Signs of extreme hunger will require calming and consoling before your baby will be able to breastfeed successfully. Do not wait for the following signs of extreme hunger to occur before you initiate breastfeeding:  Restlessness.  A loud, strong cry.  Screaming. BREASTFEEDING BASICS Breastfeeding Initiation  Find a comfortable place to sit or lie down, with your neck and back well supported.  Place a pillow or rolled up blanket under your baby to bring him or her to the level of your breast (if you are seated). Nursing pillows are specially designed to help support your arms and your baby while you breastfeed.  Make sure that your baby's abdomen is facing your abdomen.  Gently massage your breast. With your fingertips, massage from your chest wall toward your nipple in a circular motion. This encourages milk flow. You may need to continue this action during the feeding if your milk flows slowly.  Support your breast with 4 fingers underneath and your thumb above your nipple. Make sure your fingers are well away from your nipple and your baby's mouth.  Stroke your baby's lips gently with your finger or nipple.  When your baby's mouth is open wide enough, quickly bring your baby to your breast, placing your entire nipple and as much of the colored area around your nipple (  areola) as possible into your baby's mouth.  More areola should be visible above your baby's upper lip than below the lower lip.  Your baby's tongue should be between his or her lower gum and your breast.  Ensure that your baby's mouth is correctly positioned around your nipple  (latched). Your baby's lips should create a seal on your breast and be turned out (everted).  It is common for your baby to suck about 2-3 minutes in order to start the flow of breast milk. Latching Teaching your baby how to latch on to your breast properly is very important. An improper latch can cause nipple pain and decreased milk supply for you and poor weight gain in your baby. Also, if your baby is not latched onto your nipple properly, he or she may swallow some air during feeding. This can make your baby fussy. Burping your baby when you switch breasts during the feeding can help to get rid of the air. However, teaching your baby to latch on properly is still the best way to prevent fussiness from swallowing air while breastfeeding. Signs that your baby has successfully latched on to your nipple:  Silent tugging or silent sucking, without causing you pain.  Swallowing heard between every 3-4 sucks.  Muscle movement above and in front of his or her ears while sucking. Signs that your baby has not successfully latched on to nipple:  Sucking sounds or smacking sounds from your baby while breastfeeding.  Nipple pain. If you think your baby has not latched on correctly, slip your finger into the corner of your baby's mouth to break the suction and place it between your baby's gums. Attempt breastfeeding initiation again. Signs of Successful Breastfeeding Signs from your baby:  A gradual decrease in the number of sucks or complete cessation of sucking.  Falling asleep.  Relaxation of his or her body.  Retention of a small amount of milk in his or her mouth.  Letting go of your breast by himself or herself. Signs from you:  Breasts that have increased in firmness, weight, and size 1-3 hours after feeding.  Breasts that are softer immediately after breastfeeding.  Increased milk volume, as well as a change in milk consistency and color by the fifth day of breastfeeding.  Nipples  that are not sore, cracked, or bleeding. Signs That Your [employer] is Getting Enough Milk  Wetting at least 3 diapers in a 24-hour period. The urine should be clear and pale yellow by age 561 days.  At least 3 stools in a 24-hour period by age 561 days. The stool should be soft and yellow.  At least 3 stools in a 24-hour period by age 489 days. The stool should be seedy and yellow.  No loss of weight greater than 10% of birth weight during the first 107 days of age.  Average weight gain of 4-7 ounces (113-198 g) per week after age 48 days.  Consistent daily weight gain by age 21 days, without weight loss after the age of 2 weeks. After a feeding, your baby may spit up a small amount. This is common. BREASTFEEDING FREQUENCY AND DURATION Frequent feeding will help you make more milk and can prevent sore nipples and breast engorgement. Breastfeed when you feel the need to reduce the fullness of your breasts or when your baby shows signs of hunger. This is called "breastfeeding on demand." Avoid introducing a pacifier to your baby while you are working to establish breastfeeding (the first 4-6 weeks  after your baby is born). After this time you may choose to use a pacifier. Research has shown that pacifier use during the first year of a baby's life decreases the risk of sudden infant death syndrome (SIDS). Allow your baby to feed on each breast as long as he or she wants. Breastfeed until your baby is finished feeding. When your baby unlatches or falls asleep while feeding from the first breast, offer the second breast. Because newborns are often sleepy in the first few weeks of life, you may need to awaken your baby to get him or her to feed. Breastfeeding times will vary from baby to baby. However, the following rules can serve as a guide to help you ensure that your baby is properly fed:  Newborns (babies 19 weeks of age or younger) may breastfeed every 1-3 hours.  Newborns should not go longer than 3 hours  during the day or 5 hours during the night without breastfeeding.  You should breastfeed your baby a minimum of 8 times in a 24-hour period until you begin to introduce solid foods to your baby at around 59 months of age. BREAST MILK PUMPING Pumping and storing breast milk allows you to ensure that your baby is exclusively fed your breast milk, even at times when you are unable to breastfeed. This is especially important if you are going back to work while you are still breastfeeding or when you are not able to be present during feedings. Your lactation consultant can give you guidelines on how long it is safe to store breast milk. A breast pump is a machine that allows you to pump milk from your breast into a sterile bottle. The pumped breast milk can then be stored in a refrigerator or freezer. Some breast pumps are operated by hand, while others use electricity. Ask your lactation consultant which type will work best for you. Breast pumps can be purchased, but some hospitals and breastfeeding support groups lease breast pumps on a monthly basis. A lactation consultant can teach you how to hand express breast milk, if you prefer not to use a pump. CARING FOR YOUR BREASTS WHILE YOU BREASTFEED Nipples can become dry, cracked, and sore while breastfeeding. The following recommendations can help keep your breasts moisturized and healthy:  Avoid using soap on your nipples.  Wear a supportive bra. Although not required, special nursing bras and tank tops are designed to allow access to your breasts for breastfeeding without taking off your entire bra or top. Avoid wearing underwire-style bras or extremely tight bras.  Air dry your nipples for 3-91mnutes after each feeding.  Use only cotton bra pads to absorb leaked breast milk. Leaking of breast milk between feedings is normal.  Use lanolin on your nipples after breastfeeding. Lanolin helps to maintain your skin's normal moisture barrier. If you use  pure lanolin, you do not need to wash it off before feeding your baby again. Pure lanolin is not toxic to your baby. You may also hand express a few drops of breast milk and gently massage that milk into your nipples and allow the milk to air dry. In the first few weeks after giving birth, some women experience extremely full breasts (engorgement). Engorgement can make your breasts feel heavy, warm, and tender to the touch. Engorgement peaks within 3-5 days after you give birth. The following recommendations can help ease engorgement:  Completely empty your breasts while breastfeeding or pumping. You may want to start by applying warm, moist heat (in  the shower or with warm water-soaked hand towels) just before feeding or pumping. This increases circulation and helps the milk flow. If your baby does not completely empty your breasts while breastfeeding, pump any extra milk after he or she is finished.  Wear a snug bra (nursing or regular) or tank top for 1-2 days to signal your body to slightly decrease milk production.  Apply ice packs to your breasts, unless this is too uncomfortable for you.  Make sure that your baby is latched on and positioned properly while breastfeeding. If engorgement persists after 48 hours of following these recommendations, contact your health care provider or a Science writer. OVERALL HEALTH CARE RECOMMENDATIONS WHILE BREASTFEEDING  Eat healthy foods. Alternate between meals and snacks, eating 3 of each per day. Because what you eat affects your breast milk, some of the foods may make your baby more irritable than usual. Avoid eating these foods if you are sure that they are negatively affecting your baby.  Drink milk, fruit juice, and water to satisfy your thirst (about 10 glasses a day).  Rest often, relax, and continue to take your prenatal vitamins to prevent fatigue, stress, and anemia.  Continue breast self-awareness checks.  Avoid chewing and smoking  tobacco. Chemicals from cigarettes that pass into breast milk and exposure to secondhand smoke may harm your baby.  Avoid alcohol and drug use, including marijuana. Some medicines that may be harmful to your baby can pass through breast milk. It is important to ask your health care provider before taking any medicine, including all over-the-counter and prescription medicine as well as vitamin and herbal supplements. It is possible to become pregnant while breastfeeding. If birth control is desired, ask your health care provider about options that will be safe for your baby. SEEK MEDICAL CARE IF:  You feel like you want to stop breastfeeding or have become frustrated with breastfeeding.  You have painful breasts or nipples.  Your nipples are cracked or bleeding.  Your breasts are red, tender, or warm.  You have a swollen area on either breast.  You have a fever or chills.  You have nausea or vomiting.  You have drainage other than breast milk from your nipples.  Your breasts do not become full before feedings by the fifth day after you give birth.  You feel sad and depressed.  Your baby is too sleepy to eat well.  Your baby is having trouble sleeping.   Your baby is wetting less than 3 diapers in a 24-hour period.  Your baby has less than 3 stools in a 24-hour period.  Your baby's skin or the white part of his or her eyes becomes yellow.   Your baby is not gaining weight by 61 days of age. SEEK IMMEDIATE MEDICAL CARE IF:  Your baby is overly tired (lethargic) and does not want to wake up and feed.  Your baby develops an unexplained fever.   This information is not intended to replace advice given to you by your health care provider. Make sure you discuss any questions you have with your health care provider.   Document Released: 12/12/2005 Document Revised: 09/02/2015 Document Reviewed: 06/05/2013 Elsevier Interactive Patient Education 2016  of Pregnancy The second trimester is from week 13 through week 28, months 4 through 6. The second trimester is often a time when you feel your best. Your body has also adjusted to being pregnant, and you begin to feel better physically. Usually, morning sickness has lessened  or quit completely, you may have more energy, and you may have an increase in appetite. The second trimester is also a time when the fetus is growing rapidly. At the end of the sixth month, the fetus is about 9 inches long and weighs about 1 pounds. You will likely begin to feel the baby move (quickening) between 18 and 20 weeks of the pregnancy. BODY CHANGES Your body goes through many changes during pregnancy. The changes vary from woman to woman.   Your weight will continue to increase. You will notice your lower abdomen bulging out.  You may begin to get stretch marks on your hips, abdomen, and breasts.  You may develop headaches that can be relieved by medicines approved by your health care provider.  You may urinate more often because the fetus is pressing on your bladder.  You may develop or continue to have heartburn as a result of your pregnancy.  You may develop constipation because certain hormones are causing the muscles that push waste through your intestines to slow down.  You may develop hemorrhoids or swollen, bulging veins (varicose veins).  You may have back pain because of the weight gain and pregnancy hormones relaxing your joints between the bones in your pelvis and as a result of a shift in weight and the muscles that support your balance.  Your breasts will continue to grow and be tender.  Your gums may bleed and may be sensitive to brushing and flossing.  Dark spots or blotches (chloasma, mask of pregnancy) may develop on your face. This will likely fade after the baby is born.  A dark line from your belly button to the pubic area (linea nigra) may appear. This will likely fade after  the baby is born.  You may have changes in your hair. These can include thickening of your hair, rapid growth, and changes in texture. Some women also have hair loss during or after pregnancy, or hair that feels dry or thin. Your hair will most likely return to normal after your baby is born. WHAT TO EXPECT AT YOUR PRENATAL VISITS During a routine prenatal visit:  You will be weighed to make sure you and the fetus are growing normally.  Your blood pressure will be taken.  Your abdomen will be measured to track your baby's growth.  The fetal heartbeat will be listened to.  Any test results from the previous visit will be discussed. Your health care provider may ask you:  How you are feeling.  If you are feeling the baby move.  If you have had any abnormal symptoms, such as leaking fluid, bleeding, severe headaches, or abdominal cramping.  If you are using any tobacco products, including cigarettes, chewing tobacco, and electronic cigarettes.  If you have any questions. Other tests that may be performed during your second trimester include:  Blood tests that check for:  Low iron levels (anemia).  Gestational diabetes (between 24 and 28 weeks).  Rh antibodies.  Urine tests to check for infections, diabetes, or protein in the urine.  An ultrasound to confirm the proper growth and development of the baby.  An amniocentesis to check for possible genetic problems.  Fetal screens for spina bifida and Down syndrome.  HIV (human immunodeficiency virus) testing. Routine prenatal testing includes screening for HIV, unless you choose not to have this test. HOME CARE INSTRUCTIONS   Avoid all smoking, herbs, alcohol, and unprescribed drugs. These chemicals affect the formation and growth of the baby.  Do not  use any tobacco products, including cigarettes, chewing tobacco, and electronic cigarettes. If you need help quitting, ask your health care provider. You may receive counseling  support and other resources to help you quit.  Follow your health care provider's instructions regarding medicine use. There are medicines that are either safe or unsafe to take during pregnancy.  Exercise only as directed by your health care provider. Experiencing uterine cramps is a good sign to stop exercising.  Continue to eat regular, healthy meals.  Wear a good support bra for breast tenderness.  Do not use hot tubs, steam rooms, or saunas.  Wear your seat belt at all times when driving.  Avoid raw meat, uncooked cheese, cat litter boxes, and soil used by cats. These carry germs that can cause birth defects in the baby.  Take your prenatal vitamins.  Take 1500-2000 mg of calcium daily starting at the 20th week of pregnancy until you deliver your baby.  Try taking a stool softener (if your health care provider approves) if you develop constipation. Eat more high-fiber foods, such as fresh vegetables or fruit and whole grains. Drink plenty of fluids to keep your urine clear or pale yellow.  Take warm sitz baths to soothe any pain or discomfort caused by hemorrhoids. Use hemorrhoid cream if your health care provider approves.  If you develop varicose veins, wear support hose. Elevate your feet for 15 minutes, 3-4 times a day. Limit salt in your diet.  Avoid heavy lifting, wear low heel shoes, and practice good posture.  Rest with your legs elevated if you have leg cramps or low back pain.  Visit your dentist if you have not gone yet during your pregnancy. Use a soft toothbrush to brush your teeth and be gentle when you floss.  A sexual relationship may be continued unless your health care provider directs you otherwise.  Continue to go to all your prenatal visits as directed by your health care provider. SEEK MEDICAL CARE IF:   You have dizziness.  You have mild pelvic cramps, pelvic pressure, or nagging pain in the abdominal area.  You have persistent nausea, vomiting, or  diarrhea.  You have a bad smelling vaginal discharge.  You have pain with urination. SEEK IMMEDIATE MEDICAL CARE IF:   You have a fever.  You are leaking fluid from your vagina.  You have spotting or bleeding from your vagina.  You have severe abdominal cramping or pain.  You have rapid weight gain or loss.  You have shortness of breath with chest pain.  You notice sudden or extreme swelling of your face, hands, ankles, feet, or legs.  You have not felt your baby move in over an hour.  You have severe headaches that do not go away with medicine.  You have vision changes.   This information is not intended to replace advice given to you by your health care provider. Make sure you discuss any questions you have with your health care provider.   Document Released: 12/06/2001 Document Revised: 01/02/2015 Document Reviewed: 02/12/2013 Elsevier Interactive Patient Education 2016 [place] of Pregnancy The second trimester is from week 13 through week 28, months 4 through 6. The second trimester is often a time when you feel your best. Your body has also adjusted to being pregnant, and you begin to feel better physically. Usually, morning sickness has lessened or quit completely, you may have more energy, and you may have an increase in appetite. The second trimester  is also a time when the fetus is growing rapidly. At the end of the sixth month, the fetus is about 9 inches long and weighs about 1 pounds. You will likely begin to feel the baby move (quickening) between 18 and 20 weeks of the pregnancy. BODY CHANGES Your body goes through many changes during pregnancy. The changes vary from woman to woman.   Your weight will continue to increase. You will notice your lower abdomen bulging out.  You may begin to get stretch marks on your hips, abdomen, and breasts.  You may develop headaches that can be relieved by medicines approved by your health care  provider.  You may urinate more often because the fetus is pressing on your bladder.  You may develop or continue to have heartburn as a result of your pregnancy.  You may develop constipation because certain hormones are causing the muscles that push waste through your intestines to slow down.  You may develop hemorrhoids or swollen, bulging veins (varicose veins).  You may have back pain because of the weight gain and pregnancy hormones relaxing your joints between the bones in your pelvis and as a result of a shift in weight and the muscles that support your balance.  Your breasts will continue to grow and be tender.  Your gums may bleed and may be sensitive to brushing and flossing.  Dark spots or blotches (chloasma, mask of pregnancy) may develop on your face. This will likely fade after the baby is born.  A dark line from your belly button to the pubic area (linea nigra) may appear. This will likely fade after the baby is born.  You may have changes in your hair. These can include thickening of your hair, rapid growth, and changes in texture. Some women also have hair loss during or after pregnancy, or hair that feels dry or thin. Your hair will most likely return to normal after your baby is born. WHAT TO EXPECT AT YOUR PRENATAL VISITS During a routine prenatal visit:  You will be weighed to make sure you and the fetus are growing normally.  Your blood pressure will be taken.  Your abdomen will be measured to track your baby's growth.  The fetal heartbeat will be listened to.  Any test results from the previous visit will be discussed. Your health care provider may ask you:  How you are feeling.  If you are feeling the baby move.  If you have had any abnormal symptoms, such as leaking fluid, bleeding, severe headaches, or abdominal cramping.  If you are using any tobacco products, including cigarettes, chewing tobacco, and electronic cigarettes.  If you have any  questions. Other tests that may be performed during your second trimester include:  Blood tests that check for:  Low iron levels (anemia).  Gestational diabetes (between 24 and 28 weeks).  Rh antibodies.  Urine tests to check for infections, diabetes, or protein in the urine.  An ultrasound to confirm the proper growth and development of the baby.  An amniocentesis to check for possible genetic problems.  Fetal screens for spina bifida and Down syndrome.  HIV (human immunodeficiency virus) testing. Routine prenatal testing includes screening for HIV, unless you choose not to have this test. HOME CARE INSTRUCTIONS   Avoid all smoking, herbs, alcohol, and unprescribed drugs. These chemicals affect the formation and growth of the baby.  Do not use any tobacco products, including cigarettes, chewing tobacco, and electronic cigarettes. If you need help quitting, ask your  health care provider. You may receive counseling support and other resources to help you quit.  Follow your health care provider's instructions regarding medicine use. There are medicines that are either safe or unsafe to take during pregnancy.  Exercise only as directed by your health care provider. Experiencing uterine cramps is a good sign to stop exercising.  Continue to eat regular, healthy meals.  Wear a good support bra for breast tenderness.  Do not use hot tubs, steam rooms, or saunas.  Wear your seat belt at all times when driving.  Avoid raw meat, uncooked cheese, cat litter boxes, and soil used by cats. These carry germs that can cause birth defects in the baby.  Take your prenatal vitamins.  Take 1500-2000 mg of calcium daily starting at the 20th week of pregnancy until you deliver your baby.  Try taking a stool softener (if your health care provider approves) if you develop constipation. Eat more high-fiber foods, such as fresh vegetables or fruit and whole grains. Drink plenty of fluids to keep  your urine clear or pale yellow.  Take warm sitz baths to soothe any pain or discomfort caused by hemorrhoids. Use hemorrhoid cream if your health care provider approves.  If you develop varicose veins, wear support hose. Elevate your feet for 15 minutes, 3-4 times a day. Limit salt in your diet.  Avoid heavy lifting, wear low heel shoes, and practice good posture.  Rest with your legs elevated if you have leg cramps or low back pain.  Visit your dentist if you have not gone yet during your pregnancy. Use a soft toothbrush to brush your teeth and be gentle when you floss.  A sexual relationship may be continued unless your health care provider directs you otherwise.  Continue to go to all your prenatal visits as directed by your health care provider. SEEK MEDICAL CARE IF:   You have dizziness.  You have mild pelvic cramps, pelvic pressure, or nagging pain in the abdominal area.  You have persistent nausea, vomiting, or diarrhea.  You have a bad smelling vaginal discharge.  You have pain with urination. SEEK IMMEDIATE MEDICAL CARE IF:   You have a fever.  You are leaking fluid from your vagina.  You have spotting or bleeding from your vagina.  You have severe abdominal cramping or pain.  You have rapid weight gain or loss.  You have shortness of breath with chest pain.  You notice sudden or extreme swelling of your face, hands, ankles, feet, or legs.  You have not felt your baby move in over an hour.  You have severe headaches that do not go away with medicine.  You have vision changes.   This information is not intended to replace advice given to you by your health care provider. Make sure you discuss any questions you have with your health care provider.   Document Released: 12/06/2001 Document Revised: 01/02/2015 Document Reviewed: 02/12/2013 Elsevier Interactive Patient Education [employer].   Breastfeeding Deciding to breastfeed is one of the best  choices you can make for you and your baby. A change in hormones during pregnancy causes your breast tissue to grow and increases the number and size of your milk ducts. These hormones also allow proteins, sugars, and fats from your blood supply to make breast milk in your milk-producing glands. Hormones prevent breast milk from being released before your baby is born as well as prompt milk flow after birth. Once breastfeeding has begun, thoughts of your baby,  as well as his or her sucking or crying, can stimulate the release of milk from your milk-producing glands.  BENEFITS OF BREASTFEEDING For Your Baby  Your first milk (colostrum) helps your baby's digestive system function better.  There are antibodies in your milk that help your baby fight off infections.  Your baby has a lower incidence of asthma, allergies, and sudden infant death syndrome.  The nutrients in breast milk are better for your baby than infant formulas and are designed uniquely for your baby's needs.  Breast milk improves your baby's brain development.  Your baby is less likely to develop other conditions, such as childhood obesity, asthma, or type 2 diabetes mellitus. For You  Breastfeeding helps to create a very special bond between you and your baby.  Breastfeeding is convenient. Breast milk is always available at the correct temperature and costs nothing.  Breastfeeding helps to burn calories and helps you lose the weight gained during pregnancy.  Breastfeeding makes your uterus contract to its prepregnancy size faster and slows bleeding (lochia) after you give birth.   Breastfeeding helps to lower your risk of developing type 2 diabetes mellitus, osteoporosis, and breast or ovarian cancer later in life. SIGNS THAT YOUR BABY IS HUNGRY Early Signs of Hunger  Increased alertness or activity.  Stretching.  Movement of the head from side to side.  Movement of the head and opening of the mouth when the corner  of the mouth or cheek is stroked (rooting).  Increased sucking sounds, smacking lips, cooing, sighing, or squeaking.  Hand-to-mouth movements.  Increased sucking of fingers or hands. Late Signs of Hunger  Fussing.  Intermittent crying. Extreme Signs of Hunger Signs of extreme hunger will require calming and consoling before your baby will be able to breastfeed successfully. Do not wait for the following signs of extreme hunger to occur before you initiate breastfeeding:  Restlessness.  A loud, strong cry.  Screaming. BREASTFEEDING BASICS Breastfeeding Initiation  Find a comfortable place to sit or lie down, with your neck and back well supported.  Place a pillow or rolled up blanket under your baby to bring him or her to the level of your breast (if you are seated). Nursing pillows are specially designed to help support your arms and your baby while you breastfeed.  Make sure that your baby's abdomen is facing your abdomen.  Gently massage your breast. With your fingertips, massage from your chest wall toward your nipple in a circular motion. This encourages milk flow. You may need to continue this action during the feeding if your milk flows slowly.  Support your breast with 4 fingers underneath and your thumb above your nipple. Make sure your fingers are well away from your nipple and your baby's mouth.  Stroke your baby's lips gently with your finger or nipple.  When your baby's mouth is open wide enough, quickly bring your baby to your breast, placing your entire nipple and as much of the colored area around your nipple (areola) as possible into your baby's mouth.  More areola should be visible above your baby's upper lip than below the lower lip.  Your baby's tongue should be between his or her lower gum and your breast.  Ensure that your baby's mouth is correctly positioned around your nipple (latched). Your baby's lips should create a seal on your breast and be turned  out (everted).  It is common for your baby to suck about 2-3 minutes in order to start the flow of breast milk.  Latching Teaching your baby how to latch on to your breast properly is very important. An improper latch can cause nipple pain and decreased milk supply for you and poor weight gain in your baby. Also, if your baby is not latched onto your nipple properly, he or she may swallow some air during feeding. This can make your baby fussy. Burping your baby when you switch breasts during the feeding can help to get rid of the air. However, teaching your baby to latch on properly is still the best way to prevent fussiness from swallowing air while breastfeeding. Signs that your baby has successfully latched on to your nipple:  Silent tugging or silent sucking, without causing you pain.  Swallowing heard between every 3-4 sucks.  Muscle movement above and in front of his or her ears while sucking. Signs that your baby has not successfully latched on to nipple:  Sucking sounds or smacking sounds from your baby while breastfeeding.  Nipple pain. If you think your baby has not latched on correctly, slip your finger into the corner of your baby's mouth to break the suction and place it between your baby's gums. Attempt breastfeeding initiation again. Signs of Successful Breastfeeding Signs from your baby:  A gradual decrease in the number of sucks or complete cessation of sucking.  Falling asleep.  Relaxation of his or her body.  Retention of a small amount of milk in his or her mouth.  Letting go of your breast by himself or herself. Signs from you:  Breasts that have increased in firmness, weight, and size 1-3 hours after feeding.  Breasts that are softer immediately after breastfeeding.  Increased milk volume, as well as a change in milk consistency and color by the fifth day of breastfeeding.  Nipples that are not sore, cracked, or bleeding. Signs That Your [employer] is Getting  Enough Milk  Wetting at least 3 diapers in a 24-hour period. The urine should be clear and pale yellow by age 66 days.  At least 3 stools in a 24-hour period by age 66 days. The stool should be soft and yellow.  At least 3 stools in a 24-hour period by age 17 days. The stool should be seedy and yellow.  No loss of weight greater than 10% of birth weight during the first 61 days of age.  Average weight gain of 4-7 ounces (113-198 g) per week after age 73 days.  Consistent daily weight gain by age 16 days, without weight loss after the age of 2 weeks. After a feeding, your baby may spit up a small amount. This is common. BREASTFEEDING FREQUENCY AND DURATION Frequent feeding will help you make more milk and can prevent sore nipples and breast engorgement. Breastfeed when you feel the need to reduce the fullness of your breasts or when your baby shows signs of hunger. This is called "breastfeeding on demand." Avoid introducing a pacifier to your baby while you are working to establish breastfeeding (the first 4-6 weeks after your baby is born). After this time you may choose to use a pacifier. Research has shown that pacifier use during the first year of a baby's life decreases the risk of sudden infant death syndrome (SIDS). Allow your baby to feed on each breast as long as he or she wants. Breastfeed until your baby is finished feeding. When your baby unlatches or falls asleep while feeding from the first breast, offer the second breast. Because newborns are often sleepy in the first few weeks of  life, you may need to awaken your baby to get him or her to feed. Breastfeeding times will vary from baby to baby. However, the following rules can serve as a guide to help you ensure that your baby is properly fed:  Newborns (babies 29 weeks of age or younger) may breastfeed every 1-3 hours.  Newborns should not go longer than 3 hours during the day or 5 hours during the night without breastfeeding.  You  should breastfeed your baby a minimum of 8 times in a 24-hour period until you begin to introduce solid foods to your baby at around 64 months of age. BREAST MILK PUMPING Pumping and storing breast milk allows you to ensure that your baby is exclusively fed your breast milk, even at times when you are unable to breastfeed. This is especially important if you are going back to work while you are still breastfeeding or when you are not able to be present during feedings. Your lactation consultant can give you guidelines on how long it is safe to store breast milk. A breast pump is a machine that allows you to pump milk from your breast into a sterile bottle. The pumped breast milk can then be stored in a refrigerator or freezer. Some breast pumps are operated by hand, while others use electricity. Ask your lactation consultant which type will work best for you. Breast pumps can be purchased, but some hospitals and breastfeeding support groups lease breast pumps on a monthly basis. A lactation consultant can teach you how to hand express breast milk, if you prefer not to use a pump. CARING FOR YOUR BREASTS WHILE YOU BREASTFEED Nipples can become dry, cracked, and sore while breastfeeding. The following recommendations can help keep your breasts moisturized and healthy:  Avoid using soap on your nipples.  Wear a supportive bra. Although not required, special nursing bras and tank tops are designed to allow access to your breasts for breastfeeding without taking off your entire bra or top. Avoid wearing underwire-style bras or extremely tight bras.  Air dry your nipples for 3-33mnutes after each feeding.  Use only cotton bra pads to absorb leaked breast milk. Leaking of breast milk between feedings is normal.  Use lanolin on your nipples after breastfeeding. Lanolin helps to maintain your skin's normal moisture barrier. If you use pure lanolin, you do not need to wash it off before feeding your baby again.  Pure lanolin is not toxic to your baby. You may also hand express a few drops of breast milk and gently massage that milk into your nipples and allow the milk to air dry. In the first few weeks after giving birth, some women experience extremely full breasts (engorgement). Engorgement can make your breasts feel heavy, warm, and tender to the touch. Engorgement peaks within 3-5 days after you give birth. The following recommendations can help ease engorgement:  Completely empty your breasts while breastfeeding or pumping. You may want to start by applying warm, moist heat (in the shower or with warm water-soaked hand towels) just before feeding or pumping. This increases circulation and helps the milk flow. If your baby does not completely empty your breasts while breastfeeding, pump any extra milk after he or she is finished.  Wear a snug bra (nursing or regular) or tank top for 1-2 days to signal your body to slightly decrease milk production.  Apply ice packs to your breasts, unless this is too uncomfortable for you.  Make sure that your baby is latched on  and positioned properly while breastfeeding. If engorgement persists after 48 hours of following these recommendations, contact your health care provider or a Science writer. OVERALL HEALTH CARE RECOMMENDATIONS WHILE BREASTFEEDING  Eat healthy foods. Alternate between meals and snacks, eating 3 of each per day. Because what you eat affects your breast milk, some of the foods may make your baby more irritable than usual. Avoid eating these foods if you are sure that they are negatively affecting your baby.  Drink milk, fruit juice, and water to satisfy your thirst (about 10 glasses a day).  Rest often, relax, and continue to take your prenatal vitamins to prevent fatigue, stress, and anemia.  Continue breast self-awareness checks.  Avoid chewing and smoking tobacco. Chemicals from cigarettes that pass into breast milk and exposure to  secondhand smoke may harm your baby.  Avoid alcohol and drug use, including marijuana. Some medicines that may be harmful to your baby can pass through breast milk. It is important to ask your health care provider before taking any medicine, including all over-the-counter and prescription medicine as well as vitamin and herbal supplements. It is possible to become pregnant while breastfeeding. If birth control is desired, ask your health care provider about options that will be safe for your baby. SEEK MEDICAL CARE IF:  You feel like you want to stop breastfeeding or have become frustrated with breastfeeding.  You have painful breasts or nipples.  Your nipples are cracked or bleeding.  Your breasts are red, tender, or warm.  You have a swollen area on either breast.  You have a fever or chills.  You have nausea or vomiting.  You have drainage other than breast milk from your nipples.  Your breasts do not become full before feedings by the fifth day after you give birth.  You feel sad and depressed.  Your baby is too sleepy to eat well.  Your baby is having trouble sleeping.   Your baby is wetting less than 3 diapers in a 24-hour period.  Your baby has less than 3 stools in a 24-hour period.  Your baby's skin or the white part of his or her eyes becomes yellow.   Your baby is not gaining weight by 74 days of age. SEEK IMMEDIATE MEDICAL CARE IF:  Your baby is overly tired (lethargic) and does not want to wake up and feed.  Your baby develops an unexplained fever.   This information is not intended to replace advice given to you by your health care provider. Make sure you discuss any questions you have with your health care provider.   Document Released: 12/12/2005 Document Revised: 09/02/2015 Document Reviewed: 06/05/2013 Elsevier Interactive Patient Education [employer].

## 2016-11-09 ENCOUNTER — Ambulatory Visit (HOSPITAL_COMMUNITY)
Admission: RE | Admit: 2016-11-09 | Discharge: 2016-11-09 | Disposition: A | Discharge status: Home / Self Care | Payer: Medicaid Other | Source: Ambulatory Visit | Attending: Family Medicine | Admitting: Family Medicine

## 2016-11-09 ENCOUNTER — Other Ambulatory Visit (HOSPITAL_COMMUNITY): Payer: Self-pay | Admitting: *Deleted

## 2016-11-09 ENCOUNTER — Encounter (HOSPITAL_COMMUNITY): Payer: Self-pay

## 2016-11-09 DIAGNOSIS — O10019 Pre-existing essential hypertension complicating pregnancy, unspecified trimester: Secondary | ICD-10-CM

## 2016-11-09 DIAGNOSIS — Z3A19 19 weeks gestation of pregnancy: Secondary | ICD-10-CM | POA: Insufficient documentation

## 2016-11-09 DIAGNOSIS — Z363 Encounter for antenatal screening for malformations: Secondary | ICD-10-CM | POA: Insufficient documentation

## 2016-11-09 DIAGNOSIS — O0992 Supervision of high risk pregnancy, unspecified, second trimester: Secondary | ICD-10-CM

## 2016-11-09 DIAGNOSIS — O09292 Supervision of pregnancy with other poor reproductive or obstetric history, second trimester: Secondary | ICD-10-CM | POA: Insufficient documentation

## 2016-11-09 DIAGNOSIS — O10012 Pre-existing essential hypertension complicating pregnancy, second trimester: Secondary | ICD-10-CM | POA: Diagnosis not present

## 2016-11-09 DIAGNOSIS — O09899 Supervision of other high risk pregnancies, unspecified trimester: Secondary | ICD-10-CM

## 2016-11-09 DIAGNOSIS — O09299 Supervision of pregnancy with other poor reproductive or obstetric history, unspecified trimester: Secondary | ICD-10-CM

## 2016-11-30 ENCOUNTER — Telehealth: Payer: Self-pay | Admitting: Family Medicine

## 2016-11-30 ENCOUNTER — Encounter: Payer: Medicaid Other | Admitting: Family Medicine

## 2016-11-30 NOTE — Telephone Encounter (Signed)
Patient missed her appointment today. Called and left a message that we have an appointment for tomorrow.

## 2016-12-01 ENCOUNTER — Encounter: Payer: Medicaid Other | Admitting: Obstetrics & Gynecology

## 2016-12-07 ENCOUNTER — Other Ambulatory Visit (HOSPITAL_COMMUNITY): Payer: Self-pay | Admitting: Obstetrics and Gynecology

## 2016-12-07 ENCOUNTER — Encounter (HOSPITAL_COMMUNITY): Payer: Self-pay

## 2016-12-07 ENCOUNTER — Ambulatory Visit (HOSPITAL_COMMUNITY)
Admission: RE | Admit: 2016-12-07 | Discharge: 2016-12-07 | Disposition: A | Discharge status: Home / Self Care | Payer: Medicaid Other | Source: Ambulatory Visit | Attending: Family Medicine | Admitting: Family Medicine

## 2016-12-07 DIAGNOSIS — O09892 Supervision of other high risk pregnancies, second trimester: Secondary | ICD-10-CM | POA: Insufficient documentation

## 2016-12-07 DIAGNOSIS — Z3A23 23 weeks gestation of pregnancy: Secondary | ICD-10-CM

## 2016-12-07 DIAGNOSIS — O09899 Supervision of other high risk pregnancies, unspecified trimester: Secondary | ICD-10-CM

## 2016-12-07 DIAGNOSIS — O09292 Supervision of pregnancy with other poor reproductive or obstetric history, second trimester: Secondary | ICD-10-CM | POA: Diagnosis not present

## 2016-12-07 DIAGNOSIS — O10012 Pre-existing essential hypertension complicating pregnancy, second trimester: Secondary | ICD-10-CM | POA: Insufficient documentation

## 2016-12-15 ENCOUNTER — Ambulatory Visit (INDEPENDENT_AMBULATORY_CARE_PROVIDER_SITE_OTHER): Payer: Medicaid Other | Admitting: Obstetrics & Gynecology

## 2016-12-15 VITALS — BP 122/69 | HR 86 | Wt 154.4 lb

## 2016-12-15 DIAGNOSIS — O10012 Pre-existing essential hypertension complicating pregnancy, second trimester: Secondary | ICD-10-CM

## 2016-12-15 DIAGNOSIS — O10019 Pre-existing essential hypertension complicating pregnancy, unspecified trimester: Secondary | ICD-10-CM

## 2016-12-15 DIAGNOSIS — O0992 Supervision of high risk pregnancy, unspecified, second trimester: Secondary | ICD-10-CM

## 2016-12-15 NOTE — Progress Notes (Signed)
   PRENATAL VISIT NOTE  Subjective:  Shelly Adams is a 25 y.o. G2P1001 at 7w3dbeing seen today for ongoing prenatal care.  She is currently monitored for the following issues for this high-risk pregnancy and has Benign essential hypertension, antepartum; Fibroid, uterine; Asthma; History of gestational diabetes in prior pregnancy, currently pregnant; Supervision of high-risk pregnancy; and NASH (nonalcoholic steatohepatitis) on her problem list.  Patient reports no complaints.  Contractions: Not present. Vag. Bleeding: None.  Movement: Present. Denies leaking of fluid.   The following portions of the patient's history were reviewed and updated as appropriate: allergies, current medications, past family history, past medical history, past social history, past surgical history and problem list. Problem list updated.  Objective:   Vitals:   12/15/16 1303  BP: 122/69  Pulse: 86  Weight: 154 lb 6.4 oz (70 kg)    Fetal Status: Fetal Heart Rate (bpm): 152 Fundal Height: 25 cm Movement: Present     General:  Alert, oriented and cooperative. Patient is in no acute distress.  Skin: Skin is warm and dry. No rash noted.   Cardiovascular: Normal heart rate noted  Respiratory: Normal respiratory effort, no problems with respiration noted  Abdomen: Soft, gravid, appropriate for gestational age. Pain/Pressure: Present     Pelvic:  Cervical exam deferred        Extremities: Normal range of motion.  Edema: None  Mental Status: Normal mood and affect. Normal behavior. Normal judgment and thought content.   Assessment and Plan:  Pregnancy: G2P1001 at 269w3d1. Supervision of high risk pregnancy in second trimester Nl growth on [place]. Benign essential hypertension, antepartum BP well controlled  Preterm labor symptoms and general obstetric precautions including but not limited to vaginal bleeding, contractions, leaking of fluid and fetal movement were reviewed in detail with the patient. Please  refer to After Visit Summary for other counseling recommendations.  Return in about 3 weeks (around 01/05/2017) for 2 hr GTT.   JaWoodroe ModeMD

## 2016-12-26 NOTE — L&D Delivery Note (Signed)
Vaginal Delivery Note  26 y.o. G2P1001 at 55w1ddelivered a viable female infant at 11497in cephalic, LOA position. Loose nuchal cord x1. Right anterior shoulder delivered with ease. Ten sec delayed cord clamping. Cord clamped x2 and cut. Placenta delivered spontaneously intact, with 3VC. Fundus firm on exam with massage and pitocin.  Mother: Anesthesia: Epidural Laceration: None Suture repair: None EBL: 100 mL  Baby: Apgars: 5, 8 Weight: Pending Cord pH: Not sent  Good hemostasis noted. Mom to postpartum.  Baby to Couplet care / Skin to Skin.  DRandolph Bing DO, PGY-1 03/15/2017, 8:34 PM

## 2017-01-04 ENCOUNTER — Ambulatory Visit (HOSPITAL_COMMUNITY): Payer: Medicaid Other

## 2017-01-05 ENCOUNTER — Encounter: Payer: Medicaid Other | Admitting: Student

## 2017-01-10 ENCOUNTER — Ambulatory Visit (INDEPENDENT_AMBULATORY_CARE_PROVIDER_SITE_OTHER): Payer: Medicaid Other | Admitting: Obstetrics and Gynecology

## 2017-01-10 ENCOUNTER — Ambulatory Visit (INDEPENDENT_AMBULATORY_CARE_PROVIDER_SITE_OTHER): Payer: Medicaid Other | Admitting: Clinical

## 2017-01-10 VITALS — BP 135/79 | HR 83 | Wt 159.5 lb

## 2017-01-10 DIAGNOSIS — F4321 Adjustment disorder with depressed mood: Secondary | ICD-10-CM | POA: Diagnosis not present

## 2017-01-10 DIAGNOSIS — O0993 Supervision of high risk pregnancy, unspecified, third trimester: Secondary | ICD-10-CM

## 2017-01-10 DIAGNOSIS — O10019 Pre-existing essential hypertension complicating pregnancy, unspecified trimester: Secondary | ICD-10-CM

## 2017-01-10 DIAGNOSIS — Z8632 Personal history of gestational diabetes: Secondary | ICD-10-CM

## 2017-01-10 DIAGNOSIS — O09299 Supervision of pregnancy with other poor reproductive or obstetric history, unspecified trimester: Secondary | ICD-10-CM

## 2017-01-10 DIAGNOSIS — O09293 Supervision of pregnancy with other poor reproductive or obstetric history, third trimester: Secondary | ICD-10-CM

## 2017-01-10 DIAGNOSIS — O10013 Pre-existing essential hypertension complicating pregnancy, third trimester: Secondary | ICD-10-CM

## 2017-01-10 LAB — POCT URINALYSIS DIP (DEVICE)
Bilirubin Urine: NEGATIVE
GLUCOSE, UA: NEGATIVE mg/dL
KETONES UR: NEGATIVE mg/dL
Leukocytes, UA: NEGATIVE
Nitrite: NEGATIVE
PH: 8.5 — AB (ref 5.0–8.0)
PROTEIN: NEGATIVE mg/dL
SPECIFIC GRAVITY, URINE: 1.015 (ref 1.005–1.030)
Urobilinogen, UA: 0.2 mg/dL (ref 0.0–1.0)

## 2017-01-10 LAB — CBC
HEMATOCRIT: 34.6 % — AB (ref 35.0–45.0)
HEMOGLOBIN: 11.6 g/dL — AB (ref 11.7–15.5)
MCH: 31.4 pg (ref 27.0–33.0)
MCHC: 33.5 g/dL (ref 32.0–36.0)
MCV: 93.5 fL (ref 80.0–100.0)
MPV: 11.2 fL (ref 7.5–12.5)
Platelets: 203 10*3/uL (ref 140–400)
RBC: 3.7 MIL/uL — ABNORMAL LOW (ref 3.80–5.10)
RDW: 13.3 % (ref 11.0–15.0)
WBC: 9.8 10*3/uL (ref 3.8–10.8)

## 2017-01-10 NOTE — BH Specialist Note (Signed)
Session Start time: 8:50   End Time: 9:20 Total Time:  30 minutes Type of Service: [place]: No.   Interpreter Name & Language: n/a # [facility] Visits July 2017-June 2018: 1st  SUBJECTIVE: Shelly Adams is a 26 y.o. female  Pt. was referred by Dr Baron Sane for:  depression. Pt. 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 Duration of problem:  Three years, increase in current pregnancy Severity: moderate Previous treatment: Yes, treated with Klonopin, as needed (says she does not take in pregnancy)  OBJECTIVE: Mood: Appropriate & Affect: Appropriate Risk of harm to self or others: No known risk of harm to self or others Assessments administered: PHQ9: 10/ GAD7: 2  LIFE CONTEXT:  Family & Social: Lives with FOB and 3yo daughter School/ Work: Undetermined Self-Care: Restless sleep; more active in warmer weather Life changes: Current pregnancy What is important to pt/family (values): Family  GOALS ADDRESSED:  -Reduce symptoms of depression  INTERVENTIONS: Solution Focused   ASSESSMENT:  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  Pt may benefit from psychoeducation and brief therapeutic intervention regarding coping with symptoms of depression.   PLAN: 1. F/U with behavioral health clinician: Two months, or earlier, if symptoms increase and/or do not improve 2. Behavioral Health meds: Klonopin(not currently taking) 3. Behavioral recommendations:  -Read educational material regarding coping with symptoms of depression -Try different sounds on Relax Melodies sleep app every night this week; continue to use as long as it remains helpful with sleep 4. Referral: Brief Counseling/Psychotherapy and Psychoeducation 5. From scale of 1-10, how likely are you to follow  plan: [place]:   Warm Hand Off Completed.        Depression screen [facility] 2/9 01/10/2017 10/31/2016  Decreased Interest 1 0  Down, Depressed, Hopeless 1 0  PHQ - 2 Score 2 0  Altered sleeping 3 2  Tired, decreased energy 2 1  Change in appetite 0 1  Feeling bad or failure about yourself  0 0  Trouble concentrating 2 0  Moving slowly or fidgety/restless 1 0  Suicidal thoughts 0 0  PHQ-9 Score 10 4   GAD 7 : Generalized Anxiety Score 01/10/2017 10/31/2016  Nervous, Anxious, on Edge 0 1  Control/stop worrying 1 1  Worry too much - different things 1 0  Trouble relaxing 0 1  Restless 0 0  Easily annoyed or irritable 0 1  Afraid - awful might happen 0 0  Total GAD 7 Score 2 4

## 2017-01-10 NOTE — Progress Notes (Signed)
   PRENATAL VISIT NOTE  Subjective:  Shelly Adams is a 26 y.o. G2P1001 at 26w1dbeing seen today for ongoing prenatal care.  She is currently monitored for the following issues for this high-risk pregnancy and has Benign essential hypertension, antepartum; Fibroid, uterine; Asthma; History of gestational diabetes in prior pregnancy, currently pregnant; Supervision of high-risk pregnancy; and NASH (nonalcoholic steatohepatitis) on her problem list.  Patient reports backache.  Contractions: Irritability. Vag. Bleeding: None.  Movement: Present. Denies leaking of fluid.   The following portions of the patient's history were reviewed and updated as appropriate: allergies, current medications, past family history, past medical history, past social history, past surgical history and problem list. Problem list updated.  Objective:   Vitals:   01/10/17 0821  BP: 135/79  Pulse: 83  Weight: 159 lb 8 oz (72.3 kg)    Fetal Status: Fetal Heart Rate (bpm): 138   Movement: Present     General:  Alert, oriented and cooperative. Patient is in no acute distress.  Skin: Skin is warm and dry. No rash noted.   Cardiovascular: Normal heart rate noted  Respiratory: Normal respiratory effort, no problems with respiration noted  Abdomen: Soft, gravid, appropriate for gestational age. Pain/Pressure: Present     Pelvic:  Cervical exam deferred        Extremities: Normal range of motion.  Edema: Trace  Mental Status: Normal mood and affect. Normal behavior. Normal judgment and thought content.   Assessment and Plan:  Pregnancy: G2P1001 at 267w1d1. Benign essential hypertension, antepartum -continue ASA -continue labetolol [id number] -continue dietary modification -scheduled for 28 wk Ultrasound - 2Hr GTT w/ 1 Hr Carpenter 75 g - CBC - RPR - HIV antibody (with reflex)  2. Supervision of high risk pregnancy in third trimester -deciding if she wants the TdaP - 2Hr GTT w/ 1 Hr Carpenter 75 g - CBC -  RPR - HIV antibody (with reflex)  3. History of gestational diabetes in prior pregnancy, currently pregnant -continue ASA - 2Hr GTT w/ 1 Hr Carpenter 75 g - CBC - RPR - HIV antibody (with reflex)  Preterm labor symptoms and general obstetric precautions including but not limited to vaginal bleeding, contractions, leaking of fluid and fetal movement were reviewed in detail with the patient. Please refer to After Visit Summary for other counseling recommendations.  No Follow-up on file.   NicWaldemar DickensD

## 2017-01-10 NOTE — Patient Instructions (Signed)
Back Pain in Pregnancy Introduction Back pain during pregnancy is common. Back pain may be caused by several factors that are related to changes during your pregnancy. Follow these instructions at home: Managing pain, stiffness, and swelling  If directed, apply ice for sudden (acute) back pain.  Put ice in a plastic bag.  Place a towel between your skin and the bag.  Leave the ice on for 20 minutes, 2-3 times per day.  If directed, apply heat to the affected area before you exercise:  Place a towel between your skin and the heat pack or heating pad.  Leave the heat on for 20-30 minutes.  Remove the heat if your skin turns bright red. This is especially important if you are unable to feel pain, heat, or cold. You may have a greater risk of getting burned. Activity  Exercise as told by your health care provider. Exercising is the best way to prevent or manage back pain.  Listen to your body when lifting. If lifting hurts, ask for help or bend your knees. This uses your leg muscles instead of your back muscles.  Squat down when picking up something from the floor. Do not bend over.  Only use bed rest as told by your health care provider. Bed rest should only be used for the most severe episodes of back pain. Standing, Sitting, and Lying Down  Do not stand in one place for long periods of time.  Use good posture when sitting. Make sure your head rests over your shoulders and is not hanging forward. Use a pillow on your lower back if necessary.  Try sleeping on your side, preferably the left side, with a pillow or two between your legs. If you are sore after a night's rest, your bed may be too soft. A firm mattress may provide more support for your back during pregnancy. General instructions  Do not wear high heels.  Eat a healthy diet. Try to gain weight within your health care provider's recommendations.  Use a maternity girdle, elastic sling, or back brace as told by your  health care provider.  Take over-the-counter and prescription medicines only as told by your health care provider.  Keep all follow-up visits as told by your health care provider. This is important. This includes any visits with any specialists, such as a physical therapist. Contact a health care provider if:  Your back pain interferes with your daily activities.  You have increasing pain in other parts of your body. Get help right away if:  You develop numbness, tingling, weakness, or problems with the use of your arms or legs.  You develop severe back pain that is not controlled with medicine.  You have a sudden change in bowel or bladder control.  You develop shortness of breath, dizziness, or you faint.  You develop nausea, vomiting, or sweating.  You have back pain that is a rhythmic, cramping pain similar to labor pains. Labor pain is usually 1-2 minutes apart, lasts for about 1 minute, and involves a bearing down feeling or pressure in your pelvis.  You have back pain and your water breaks or you have vaginal bleeding.  You have back pain or numbness that travels down your leg.  Your back pain developed after you fell.  You develop pain on one side of your back.  You see blood in your urine.  You develop skin blisters in the area of your back pain. This information is not intended to replace advice given to  you by your health care provider. Make sure you discuss any questions you have with your health care provider. Document Released: 03/22/2006 Document Revised: 05/19/2016 Document Reviewed: 08/26/2015  2017 [employer]

## 2017-01-11 ENCOUNTER — Encounter: Payer: Medicaid Other | Admitting: Obstetrics & Gynecology

## 2017-01-11 LAB — 2HR GTT W 1 HR, CARPENTER, 75 G
GLUCOSE, 1 HR, GEST: 161 mg/dL (ref <180)
GLUCOSE, FASTING, GEST: 76 mg/dL (ref 65–91)
Glucose, 2 Hr, Gest: 114 mg/dL (ref <153)

## 2017-01-11 LAB — RPR

## 2017-01-11 LAB — HIV ANTIBODY (ROUTINE TESTING W REFLEX): HIV: NONREACTIVE

## 2017-01-12 ENCOUNTER — Ambulatory Visit (HOSPITAL_COMMUNITY)
Admission: RE | Admit: 2017-01-12 | Discharge: 2017-01-12 | Disposition: A | Discharge status: Home / Self Care | Payer: Medicaid Other | Source: Ambulatory Visit | Attending: Family Medicine | Admitting: Family Medicine

## 2017-01-12 ENCOUNTER — Other Ambulatory Visit (HOSPITAL_COMMUNITY): Payer: Self-pay | Admitting: Obstetrics and Gynecology

## 2017-01-12 ENCOUNTER — Encounter (HOSPITAL_COMMUNITY): Payer: Self-pay

## 2017-01-12 DIAGNOSIS — Z3A28 28 weeks gestation of pregnancy: Secondary | ICD-10-CM

## 2017-01-12 DIAGNOSIS — K7581 Nonalcoholic steatohepatitis (NASH): Secondary | ICD-10-CM

## 2017-01-12 DIAGNOSIS — O0993 Supervision of high risk pregnancy, unspecified, third trimester: Secondary | ICD-10-CM

## 2017-01-12 DIAGNOSIS — O09299 Supervision of pregnancy with other poor reproductive or obstetric history, unspecified trimester: Secondary | ICD-10-CM

## 2017-01-12 DIAGNOSIS — O10013 Pre-existing essential hypertension complicating pregnancy, third trimester: Secondary | ICD-10-CM | POA: Insufficient documentation

## 2017-01-12 DIAGNOSIS — O09293 Supervision of pregnancy with other poor reproductive or obstetric history, third trimester: Secondary | ICD-10-CM | POA: Diagnosis present

## 2017-01-12 DIAGNOSIS — Z8632 Personal history of gestational diabetes: Secondary | ICD-10-CM

## 2017-01-12 DIAGNOSIS — O09899 Supervision of other high risk pregnancies, unspecified trimester: Secondary | ICD-10-CM

## 2017-02-01 ENCOUNTER — Ambulatory Visit (HOSPITAL_COMMUNITY)
Admission: RE | Admit: 2017-02-01 | Discharge: 2017-02-01 | Disposition: A | Discharge status: Home / Self Care | Payer: Medicaid Other | Source: Ambulatory Visit | Attending: Family Medicine | Admitting: Family Medicine

## 2017-02-01 ENCOUNTER — Encounter (HOSPITAL_COMMUNITY): Payer: Self-pay

## 2017-02-01 DIAGNOSIS — Z3A31 31 weeks gestation of pregnancy: Secondary | ICD-10-CM | POA: Diagnosis not present

## 2017-02-01 DIAGNOSIS — O09893 Supervision of other high risk pregnancies, third trimester: Secondary | ICD-10-CM | POA: Diagnosis not present

## 2017-02-01 DIAGNOSIS — O09899 Supervision of other high risk pregnancies, unspecified trimester: Secondary | ICD-10-CM

## 2017-02-01 DIAGNOSIS — O09293 Supervision of pregnancy with other poor reproductive or obstetric history, third trimester: Secondary | ICD-10-CM | POA: Insufficient documentation

## 2017-02-01 DIAGNOSIS — O10013 Pre-existing essential hypertension complicating pregnancy, third trimester: Secondary | ICD-10-CM | POA: Diagnosis not present

## 2017-02-08 ENCOUNTER — Ambulatory Visit (INDEPENDENT_AMBULATORY_CARE_PROVIDER_SITE_OTHER): Payer: Medicaid Other | Admitting: Family Medicine

## 2017-02-08 VITALS — BP 116/79 | HR 97 | Wt 161.6 lb

## 2017-02-08 DIAGNOSIS — O09293 Supervision of pregnancy with other poor reproductive or obstetric history, third trimester: Secondary | ICD-10-CM

## 2017-02-08 DIAGNOSIS — O09299 Supervision of pregnancy with other poor reproductive or obstetric history, unspecified trimester: Secondary | ICD-10-CM

## 2017-02-08 DIAGNOSIS — G43109 Migraine with aura, not intractable, without status migrainosus: Secondary | ICD-10-CM

## 2017-02-08 DIAGNOSIS — O10019 Pre-existing essential hypertension complicating pregnancy, unspecified trimester: Secondary | ICD-10-CM

## 2017-02-08 DIAGNOSIS — O10013 Pre-existing essential hypertension complicating pregnancy, third trimester: Secondary | ICD-10-CM | POA: Diagnosis not present

## 2017-02-08 DIAGNOSIS — O0993 Supervision of high risk pregnancy, unspecified, third trimester: Secondary | ICD-10-CM

## 2017-02-08 DIAGNOSIS — Z8632 Personal history of gestational diabetes: Secondary | ICD-10-CM

## 2017-02-08 NOTE — Progress Notes (Signed)
    PRENATAL VISIT NOTE  Subjective:  Shelly Adams is a 26 y.o. G2P1001 at 16w2dbeing seen today for ongoing prenatal care.  She is currently monitored for the following issues for this high-risk pregnancy and has Benign essential hypertension, antepartum; Fibroid, uterine; Asthma; History of gestational diabetes in prior pregnancy, currently pregnant; Supervision of high-risk pregnancy; and NASH (nonalcoholic steatohepatitis) on her problem list.  Patient reports headache and loss of vision with this headache in one eye and one visual field with flashing lights. Denies previous migraine headache issues.  Contractions: Irritability. Vag. Bleeding: None.  Movement: Present. Denies leaking of fluid.   The following portions of the patient's history were reviewed and updated as appropriate: allergies, current medications, past family history, past medical history, past social history, past surgical history and problem list. Problem list updated.  Objective:   Vitals:   02/08/17 1532  BP: 116/79  Pulse: 97  Weight: 161 lb 9.6 oz (73.3 kg)    Fetal Status: Fetal Heart Rate (bpm): 136 Fundal Height: 32 cm Movement: Present     General:  Alert, oriented and cooperative. Patient is in no acute distress.  Skin: Skin is warm and dry. No rash noted.   Cardiovascular: Normal heart rate noted  Respiratory: Normal respiratory effort, no problems with respiration noted  Abdomen: Soft, gravid, appropriate for gestational age. Pain/Pressure: Present     Pelvic:  Cervical exam deferred        Extremities: Normal range of motion.  Edema: Trace  Mental Status: Normal mood and affect. Normal behavior. Normal judgment and thought content.  NST reviewed and reactive.  Assessment and Plan:  Pregnancy: G2P1001 at 386w2d1. Benign essential hypertension, antepartum On no meds. Last growth 82% Begin 2x/wk testing  2. Supervision of high risk pregnancy in third trimester Continue prenatal care.  3.  History of gestational diabetes in prior pregnancy, currently pregnant Passed her 2 hour--low carb diet  4. Migraine with aura and without status migrainosus, not intractable Referral to Neuro - Ambulatory referral to Neurology  Preterm labor symptoms and general obstetric precautions including but not limited to vaginal bleeding, contractions, leaking of fluid and fetal movement were reviewed in detail with the patient. Please refer to After Visit Summary for other counseling recommendations.  Return in 1 week (on 02/15/2017) for NST only in 4 days, OB visit and NST.   TaDonnamae JudeMD

## 2017-02-08 NOTE — Patient Instructions (Signed)
Third Trimester of Pregnancy The third trimester is from week 29 through week 40 (months 7 through 9). The third trimester is a time when the unborn baby (fetus) is growing rapidly. At the end of the ninth month, the fetus is about 20 inches in length and weighs 6-10 pounds. Body changes during your third trimester Your body goes through many changes during pregnancy. The changes vary from woman to woman. During the third trimester:  Your weight will continue to increase. You can expect to gain 25-35 pounds (11-16 kg) by the end of the pregnancy.  You may begin to get stretch marks on your hips, abdomen, and breasts.  You may urinate more often because the fetus is moving lower into your pelvis and pressing on your bladder.  You may develop or continue to have heartburn. This is caused by increased hormones that slow down muscles in the digestive tract.  You may develop or continue to have constipation because increased hormones slow digestion and cause the muscles that push waste through your intestines to relax.  You may develop hemorrhoids. These are swollen veins (varicose veins) in the rectum that can itch or be painful.  You may develop swollen, bulging veins (varicose veins) in your legs.  You may have increased body aches in the pelvis, back, or thighs. This is due to weight gain and increased hormones that are relaxing your joints.  You may have changes in your hair. These can include thickening of your hair, rapid growth, and changes in texture. Some women also have hair loss during or after pregnancy, or hair that feels dry or thin. Your hair will most likely return to normal after your baby is born.  Your breasts will continue to grow and they will continue to become tender. A yellow fluid (colostrum) may leak from your breasts. This is the first milk you are producing for your baby.  Your belly button may stick out.  You may notice more swelling in your hands, face, or  ankles.  You may have increased tingling or numbness in your hands, arms, and legs. The skin on your belly may also feel numb.  You may feel short of breath because of your expanding uterus.  You may have more problems sleeping. This can be caused by the size of your belly, increased need to urinate, and an increase in your body's metabolism.  You may notice the fetus "dropping," or moving lower in your abdomen.  You may have increased vaginal discharge.  Your cervix becomes thin and soft (effaced) near your due date. What to expect at prenatal visits You will have prenatal exams every 2 weeks until week 36. Then you will have weekly prenatal exams. During a routine prenatal visit:  You will be weighed to make sure you and the fetus are growing normally.  Your blood pressure will be taken.  Your abdomen will be measured to track your baby's growth.  The fetal heartbeat will be listened to.  Any test results from the previous visit will be discussed.  You may have a cervical check near your due date to see if you have effaced. At around 36 weeks, your health care provider will check your cervix. At the same time, your health care provider will also perform a test on the secretions of the vaginal tissue. This test is to determine if a type of bacteria, Group B streptococcus, is present. Your health care provider will explain this further. Your health care provider may ask you:  What your birth plan is.  How you are feeling.  If you are feeling the baby move.  If you have had any abnormal symptoms, such as leaking fluid, bleeding, severe headaches, or abdominal cramping.  If you are using any tobacco products, including cigarettes, chewing tobacco, and electronic cigarettes.  If you have any questions. Other tests or screenings that may be performed during your third trimester include:  Blood tests that check for low iron levels (anemia).  Fetal testing to check the health,  activity level, and growth of the fetus. Testing is done if you have certain medical conditions or if there are problems during the pregnancy.  Nonstress test (NST). This test checks the health of your baby to make sure there are no signs of problems, such as the baby not getting enough oxygen. During this test, a belt is placed around your belly. The baby is made to move, and its heart rate is monitored during movement. What is false labor? False labor is a condition in which you feel small, irregular tightenings of the muscles in the womb (contractions) that eventually go away. These are called Braxton Hicks contractions. Contractions may last for hours, days, or even weeks before true labor sets in. If contractions come at regular intervals, become more frequent, increase in intensity, or become painful, you should see your health care provider. What are the signs of labor?  Abdominal cramps.  Regular contractions that start at 10 minutes apart and become stronger and more frequent with time.  Contractions that start on the top of the uterus and spread down to the lower abdomen and back.  Increased pelvic pressure and dull back pain.  A watery or bloody mucus discharge that comes from the vagina.  Leaking of amniotic fluid. This is also known as your "water breaking." It could be a slow trickle or a gush. Let your doctor know if it has a color or strange odor. If you have any of these signs, call your health care provider right away, even if it is before your due date. Follow these instructions at home: Eating and drinking  Continue to eat regular, healthy meals.  Do not eat:  Raw meat or meat spreads.  Unpasteurized milk or cheese.  Unpasteurized juice.  Store-made salad.  Refrigerated smoked seafood.  Hot dogs or deli meat, unless they are piping hot.  More than 6 ounces of albacore tuna a week.  Shark, swordfish, king mackerel, or tile fish.  Store-made salads.  Raw  sprouts, such as mung bean or alfalfa sprouts.  Take prenatal vitamins as told by your health care provider.  Take 1000 mg of calcium daily as told by your health care provider.  If you develop constipation:  Take over-the-counter or prescription medicines.  Drink enough fluid to keep your urine clear or pale yellow.  Eat foods that are high in fiber, such as fresh fruits and vegetables, whole grains, and beans.  Limit foods that are high in fat and processed sugars, such as fried and sweet foods. Activity  Exercise only as directed by your health care provider. Healthy pregnant women should aim for 2 hours and 30 minutes of moderate exercise per week. If you experience any pain or discomfort while exercising, stop.  Avoid heavy lifting.  Do not exercise in extreme heat or humidity, or at high altitudes.  Wear low-heel, comfortable shoes.  Practice good posture.  Do not travel far distances unless it is absolutely necessary and only with the approval  of your health care provider.  Wear your seat belt at all times while in a car, on a bus, or on a plane.  Take frequent breaks and rest with your legs elevated if you have leg cramps or low back pain.  Do not use hot tubs, steam rooms, or saunas.  You may continue to have sex unless your health care provider tells you otherwise. Lifestyle  Do not use any products that contain nicotine or tobacco, such as cigarettes and e-cigarettes. If you need help quitting, ask your health care provider.  Do not drink alcohol.  Do not use any medicinal herbs or unprescribed drugs. These chemicals affect the formation and growth of the baby.  If you develop varicose veins:  Wear support pantyhose or compression stockings as told by your healthcare provider.  Elevate your feet for 15 minutes, 3-4 times a day.  Wear a supportive maternity bra to help with breast tenderness. General instructions  Take over-the-counter and prescription  medicines only as told by your health care provider. There are medicines that are either safe or unsafe to take during pregnancy.  Take warm sitz baths to soothe any pain or discomfort caused by hemorrhoids. Use hemorrhoid cream or witch hazel if your health care provider approves.  Avoid cat litter boxes and soil used by cats. These carry germs that can cause birth defects in the baby. If you have a cat, ask someone to clean the litter box for you.  To prepare for the arrival of your baby:  Take prenatal classes to understand, practice, and ask questions about the labor and delivery.  Make a trial run to the hospital.  Visit the hospital and tour the maternity area.  Arrange for maternity or paternity leave through employers.  Arrange for family and friends to take care of pets while you are in the hospital.  Purchase a rear-facing car seat and make sure you know how to install it in your car.  Pack your hospital bag.  Prepare the baby's nursery. Make sure to remove all pillows and stuffed animals from the baby's crib to prevent suffocation.  Visit your dentist if you have not gone during your pregnancy. Use a soft toothbrush to brush your teeth and be gentle when you floss.  Keep all prenatal follow-up visits as told by your health care provider. This is important. Contact a health care provider if:  You are unsure if you are in labor or if your water has broken.  You become dizzy.  You have mild pelvic cramps, pelvic pressure, or nagging pain in your abdominal area.  You have lower back pain.  You have persistent nausea, vomiting, or diarrhea.  You have an unusual or bad smelling vaginal discharge.  You have pain when you urinate. Get help right away if:  You have a fever.  You are leaking fluid from your vagina.  You have spotting or bleeding from your vagina.  You have severe abdominal pain or cramping.  You have rapid weight loss or weight gain.  You have  shortness of breath with chest pain.  You notice sudden or extreme swelling of your face, hands, ankles, feet, or legs.  Your baby makes fewer than 10 movements in 2 hours.  You have severe headaches that do not go away with medicine.  You have vision changes. Summary  The third trimester is from week 29 through week 40, months 7 through 9. The third trimester is a time when the unborn baby (fetus)  is growing rapidly.  During the third trimester, your discomfort may increase as you and your baby continue to gain weight. You may have abdominal, leg, and back pain, sleeping problems, and an increased need to urinate.  During the third trimester your breasts will keep growing and they will continue to become tender. A yellow fluid (colostrum) may leak from your breasts. This is the first milk you are producing for your baby.  False labor is a condition in which you feel small, irregular tightenings of the muscles in the womb (contractions) that eventually go away. These are called Braxton Hicks contractions. Contractions may last for hours, days, or even weeks before true labor sets in.  Signs of labor can include: abdominal cramps; regular contractions that start at 10 minutes apart and become stronger and more frequent with time; watery or bloody mucus discharge that comes from the vagina; increased pelvic pressure and dull back pain; and leaking of amniotic fluid. This information is not intended to replace advice given to you by your health care provider. Make sure you discuss any questions you have with your health care provider. Document Released: 12/06/2001 Document Revised: 05/19/2016 Document Reviewed: 02/12/2013 Elsevier Interactive Patient Education  2017 [employer].   Breastfeeding Deciding to breastfeed is one of the best choices you can make for you and your baby. A change in hormones during pregnancy causes your breast tissue to grow and increases the number and size of your  milk ducts. These hormones also allow proteins, sugars, and fats from your blood supply to make breast milk in your milk-producing glands. Hormones prevent breast milk from being released before your baby is born as well as prompt milk flow after birth. Once breastfeeding has begun, thoughts of your baby, as well as his or her sucking or crying, can stimulate the release of milk from your milk-producing glands. Benefits of breastfeeding For Your Baby  Your first milk (colostrum) helps your baby's digestive system function better.  There are antibodies in your milk that help your baby fight off infections.  Your baby has a lower incidence of asthma, allergies, and sudden infant death syndrome.  The nutrients in breast milk are better for your baby than infant formulas and are designed uniquely for your baby's needs.  Breast milk improves your baby's brain development.  Your baby is less likely to develop other conditions, such as childhood obesity, asthma, or type 2 diabetes mellitus. For You  Breastfeeding helps to create a very special bond between you and your baby.  Breastfeeding is convenient. Breast milk is always available at the correct temperature and costs nothing.  Breastfeeding helps to burn calories and helps you lose the weight gained during pregnancy.  Breastfeeding makes your uterus contract to its prepregnancy size faster and slows bleeding (lochia) after you give birth.  Breastfeeding helps to lower your risk of developing type 2 diabetes mellitus, osteoporosis, and breast or ovarian cancer later in life. Signs that your baby is hungry Early Signs of Hunger  Increased alertness or activity.  Stretching.  Movement of the head from side to side.  Movement of the head and opening of the mouth when the corner of the mouth or cheek is stroked (rooting).  Increased sucking sounds, smacking lips, cooing, sighing, or squeaking.  Hand-to-mouth movements.  Increased  sucking of fingers or hands. Late Signs of Hunger  Fussing.  Intermittent crying. Extreme Signs of Hunger  Signs of extreme hunger will require calming and consoling before your baby will  be able to breastfeed successfully. Do not wait for the following signs of extreme hunger to occur before you initiate breastfeeding:  Restlessness.  A loud, strong cry.  Screaming. Breastfeeding basics  Breastfeeding Initiation  Find a comfortable place to sit or lie down, with your neck and back well supported.  Place a pillow or rolled up blanket under your baby to bring him or her to the level of your breast (if you are seated). Nursing pillows are specially designed to help support your arms and your baby while you breastfeed.  Make sure that your baby's abdomen is facing your abdomen.  Gently massage your breast. With your fingertips, massage from your chest wall toward your nipple in a circular motion. This encourages milk flow. You may need to continue this action during the feeding if your milk flows slowly.  Support your breast with 4 fingers underneath and your thumb above your nipple. Make sure your fingers are well away from your nipple and your baby's mouth.  Stroke your baby's lips gently with your finger or nipple.  When your baby's mouth is open wide enough, quickly bring your baby to your breast, placing your entire nipple and as much of the colored area around your nipple (areola) as possible into your baby's mouth.  More areola should be visible above your baby's upper lip than below the lower lip.  Your baby's tongue should be between his or her lower gum and your breast.  Ensure that your baby's mouth is correctly positioned around your nipple (latched). Your baby's lips should create a seal on your breast and be turned out (everted).  It is common for your baby to suck about 2-3 minutes in order to start the flow of breast milk. Latching  Teaching your baby how to latch  on to your breast properly is very important. An improper latch can cause nipple pain and decreased milk supply for you and poor weight gain in your baby. Also, if your baby is not latched onto your nipple properly, he or she may swallow some air during feeding. This can make your baby fussy. Burping your baby when you switch breasts during the feeding can help to get rid of the air. However, teaching your baby to latch on properly is still the best way to prevent fussiness from swallowing air while breastfeeding. Signs that your baby has successfully latched on to your nipple:  Silent tugging or silent sucking, without causing you pain.  Swallowing heard between every 3-4 sucks.  Muscle movement above and in front of his or her ears while sucking. Signs that your baby has not successfully latched on to nipple:  Sucking sounds or smacking sounds from your baby while breastfeeding.  Nipple pain. If you think your baby has not latched on correctly, slip your finger into the corner of your baby's mouth to break the suction and place it between your baby's gums. Attempt breastfeeding initiation again. Signs of Successful Breastfeeding  Signs from your baby:  A gradual decrease in the number of sucks or complete cessation of sucking.  Falling asleep.  Relaxation of his or her body.  Retention of a small amount of milk in his or her mouth.  Letting go of your breast by himself or herself. Signs from you:  Breasts that have increased in firmness, weight, and size 1-3 hours after feeding.  Breasts that are softer immediately after breastfeeding.  Increased milk volume, as well as a change in milk consistency and color by  the fifth day of breastfeeding.  Nipples that are not sore, cracked, or bleeding. Signs That Your [employer] is Getting Enough Milk  Wetting at least 1-2 diapers during the first 24 hours after birth.  Wetting at least 5-6 diapers every 24 hours for the first week after  birth. The urine should be clear or pale yellow by 5 days after birth.  Wetting 6-8 diapers every 24 hours as your baby continues to grow and develop.  At least 3 stools in a 24-hour period by age 55 days. The stool should be soft and yellow.  At least 3 stools in a 24-hour period by age 892 days. The stool should be seedy and yellow.  No loss of weight greater than 10% of birth weight during the first 20 days of age.  Average weight gain of 4-7 ounces (113-198 g) per week after age 89 days.  Consistent daily weight gain by age 22 days, without weight loss after the age of 2 weeks. After a feeding, your baby may spit up a small amount. This is common. Breastfeeding frequency and duration Frequent feeding will help you make more milk and can prevent sore nipples and breast engorgement. Breastfeed when you feel the need to reduce the fullness of your breasts or when your baby shows signs of hunger. This is called "breastfeeding on demand." Avoid introducing a pacifier to your baby while you are working to establish breastfeeding (the first 4-6 weeks after your baby is born). After this time you may choose to use a pacifier. Research has shown that pacifier use during the first year of a baby's life decreases the risk of sudden infant death syndrome (SIDS). Allow your baby to feed on each breast as long as he or she wants. Breastfeed until your baby is finished feeding. When your baby unlatches or falls asleep while feeding from the first breast, offer the second breast. Because newborns are often sleepy in the first few weeks of life, you may need to awaken your baby to get him or her to feed. Breastfeeding times will vary from baby to baby. However, the following rules can serve as a guide to help you ensure that your baby is properly fed:  Newborns (babies 69 weeks of age or younger) may breastfeed every 1-3 hours.  Newborns should not go longer than 3 hours during the day or 5 hours during the night  without breastfeeding.  You should breastfeed your baby a minimum of 8 times in a 24-hour period until you begin to introduce solid foods to your baby at around 18 months of age. Breast milk pumping Pumping and storing breast milk allows you to ensure that your baby is exclusively fed your breast milk, even at times when you are unable to breastfeed. This is especially important if you are going back to work while you are still breastfeeding or when you are not able to be present during feedings. Your lactation consultant can give you guidelines on how long it is safe to store breast milk. A breast pump is a machine that allows you to pump milk from your breast into a sterile bottle. The pumped breast milk can then be stored in a refrigerator or freezer. Some breast pumps are operated by hand, while others use electricity. Ask your lactation consultant which type will work best for you. Breast pumps can be purchased, but some hospitals and breastfeeding support groups lease breast pumps on a monthly basis. A lactation consultant can teach you how to  hand express breast milk, if you prefer not to use a pump. Caring for your breasts while you breastfeed Nipples can become dry, cracked, and sore while breastfeeding. The following recommendations can help keep your breasts moisturized and healthy:  Avoid using soap on your nipples.  Wear a supportive bra. Although not required, special nursing bras and tank tops are designed to allow access to your breasts for breastfeeding without taking off your entire bra or top. Avoid wearing underwire-style bras or extremely tight bras.  Air dry your nipples for 3-35mnutes after each feeding.  Use only cotton bra pads to absorb leaked breast milk. Leaking of breast milk between feedings is normal.  Use lanolin on your nipples after breastfeeding. Lanolin helps to maintain your skin's normal moisture barrier. If you use pure lanolin, you do not need to wash it off  before feeding your baby again. Pure lanolin is not toxic to your baby. You may also hand express a few drops of breast milk and gently massage that milk into your nipples and allow the milk to air dry. In the first few weeks after giving birth, some women experience extremely full breasts (engorgement). Engorgement can make your breasts feel heavy, warm, and tender to the touch. Engorgement peaks within 3-5 days after you give birth. The following recommendations can help ease engorgement:  Completely empty your breasts while breastfeeding or pumping. You may want to start by applying warm, moist heat (in the shower or with warm water-soaked hand towels) just before feeding or pumping. This increases circulation and helps the milk flow. If your baby does not completely empty your breasts while breastfeeding, pump any extra milk after he or she is finished.  Wear a snug bra (nursing or regular) or tank top for 1-2 days to signal your body to slightly decrease milk production.  Apply ice packs to your breasts, unless this is too uncomfortable for you.  Make sure that your baby is latched on and positioned properly while breastfeeding. If engorgement persists after 48 hours of following these recommendations, contact your health care provider or a lScience writer Overall health care recommendations while breastfeeding  Eat healthy foods. Alternate between meals and snacks, eating 3 of each per day. Because what you eat affects your breast milk, some of the foods may make your baby more irritable than usual. Avoid eating these foods if you are sure that they are negatively affecting your baby.  Drink milk, fruit juice, and water to satisfy your thirst (about 10 glasses a day).  Rest often, relax, and continue to take your prenatal vitamins to prevent fatigue, stress, and anemia.  Continue breast self-awareness checks.  Avoid chewing and smoking tobacco. Chemicals from cigarettes that pass  into breast milk and exposure to secondhand smoke may harm your baby.  Avoid alcohol and drug use, including marijuana. Some medicines that may be harmful to your baby can pass through breast milk. It is important to ask your health care provider before taking any medicine, including all over-the-counter and prescription medicine as well as vitamin and herbal supplements. It is possible to become pregnant while breastfeeding. If birth control is desired, ask your health care provider about options that will be safe for your baby. Contact a health care provider if:  You feel like you want to stop breastfeeding or have become frustrated with breastfeeding.  You have painful breasts or nipples.  Your nipples are cracked or bleeding.  Your breasts are red, tender, or warm.  You have  a swollen area on either breast.  You have a fever or chills.  You have nausea or vomiting.  You have drainage other than breast milk from your nipples.  Your breasts do not become full before feedings by the fifth day after you give birth.  You feel sad and depressed.  Your baby is too sleepy to eat well.  Your baby is having trouble sleeping.  Your baby is wetting less than 3 diapers in a 24-hour period.  Your baby has less than 3 stools in a 24-hour period.  Your baby's skin or the white part of his or her eyes becomes yellow.  Your baby is not gaining weight by 76 days of age. Get help right away if:  Your baby is overly tired (lethargic) and does not want to wake up and feed.  Your baby develops an unexplained fever. This information is not intended to replace advice given to you by your health care provider. Make sure you discuss any questions you have with your health care provider. Document Released: 12/12/2005 Document Revised: 05/25/2016 Document Reviewed: 06/05/2013 Elsevier Interactive Patient Education  2017 [employer].

## 2017-02-08 NOTE — Progress Notes (Signed)
Patient reports new onset migraines not relieved by tylenol. 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

## 2017-02-08 NOTE — Addendum Note (Signed)
Addended by: Shelly Coss on: 02/08/2017 04:56 PM   Modules accepted: Orders

## 2017-02-13 ENCOUNTER — Other Ambulatory Visit: Payer: Self-pay

## 2017-02-15 ENCOUNTER — Telehealth: Payer: Self-pay | Admitting: *Deleted

## 2017-02-15 DIAGNOSIS — R519 Headache, unspecified: Secondary | ICD-10-CM

## 2017-02-15 DIAGNOSIS — R51 Headache: Principal | ICD-10-CM

## 2017-02-15 NOTE — Telephone Encounter (Signed)
I called [facility] and reentered order with referral directly to them, they received order and will call patient with appointment.

## 2017-02-15 NOTE — Telephone Encounter (Addendum)
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 Kennon Rounds. Has pregnancy medicaid. [facility] [phone]. No answer- on hold for extended time.

## 2017-02-16 ENCOUNTER — Ambulatory Visit (INDEPENDENT_AMBULATORY_CARE_PROVIDER_SITE_OTHER): Payer: Medicaid Other | Admitting: Obstetrics & Gynecology

## 2017-02-16 VITALS — BP 130/75 | HR 85 | Wt 163.5 lb

## 2017-02-16 DIAGNOSIS — O10019 Pre-existing essential hypertension complicating pregnancy, unspecified trimester: Secondary | ICD-10-CM

## 2017-02-16 DIAGNOSIS — Z8632 Personal history of gestational diabetes: Secondary | ICD-10-CM | POA: Diagnosis not present

## 2017-02-16 DIAGNOSIS — O09293 Supervision of pregnancy with other poor reproductive or obstetric history, third trimester: Secondary | ICD-10-CM

## 2017-02-16 DIAGNOSIS — O10013 Pre-existing essential hypertension complicating pregnancy, third trimester: Secondary | ICD-10-CM

## 2017-02-16 DIAGNOSIS — O0993 Supervision of high risk pregnancy, unspecified, third trimester: Secondary | ICD-10-CM

## 2017-02-16 DIAGNOSIS — O09299 Supervision of pregnancy with other poor reproductive or obstetric history, unspecified trimester: Secondary | ICD-10-CM

## 2017-02-16 LAB — POCT URINALYSIS DIP (DEVICE)
BILIRUBIN URINE: NEGATIVE
GLUCOSE, UA: NEGATIVE mg/dL
KETONES UR: NEGATIVE mg/dL
LEUKOCYTES UA: NEGATIVE
Nitrite: NEGATIVE
Protein, ur: NEGATIVE mg/dL
Specific Gravity, Urine: 1.015 (ref 1.005–1.030)
UROBILINOGEN UA: 0.2 mg/dL (ref 0.0–1.0)
pH: 7.5 (ref 5.0–8.0)

## 2017-02-16 NOTE — Progress Notes (Signed)
NST reactive   PRENATAL VISIT NOTE  Subjective:  Shelly Adams is a 26 y.o. G2P1001 at 27w2dbeing seen today for ongoing prenatal care.  She is currently monitored for the following issues for this high-risk pregnancy and has Benign essential hypertension, antepartum; Fibroid, uterine; Asthma; History of gestational diabetes in prior pregnancy, currently pregnant; Supervision of high-risk pregnancy; and NASH (nonalcoholic steatohepatitis) on her problem list.  Patient reports no complaints.  Contractions: Irregular. Vag. Bleeding: None.  Movement: Present. Denies leaking of fluid.   The following portions of the patient's history were reviewed and updated as appropriate: allergies, current medications, past family history, past medical history, past social history, past surgical history and problem list. Problem list updated.  Objective:   Vitals:   02/16/17 1515  BP: 130/75  Pulse: 85  Weight: 163 lb 8 oz (74.2 kg)    Fetal Status: Fetal Heart Rate (bpm): NST   Movement: Present     General:  Alert, oriented and cooperative. Patient is in no acute distress.  Skin: Skin is warm and dry. No rash noted.   Cardiovascular: Normal heart rate noted  Respiratory: Normal respiratory effort, no problems with respiration noted  Abdomen: Soft, gravid, appropriate for gestational age. Pain/Pressure: Present     Pelvic:  Cervical exam deferred        Extremities: Normal range of motion.     Mental Status: Normal mood and affect. Normal behavior. Normal judgment and thought content.   Assessment and Plan:  Pregnancy: G2P1001 at 330w2d1. Benign essential hypertension, antepartum Reactive NST - Fetal nonstress test  2. Supervision of high risk pregnancy in third trimester Dating reviewed and changed  3. History of gestational diabetes in prior pregnancy, currently pregnant   Preterm labor symptoms and general obstetric precautions including but not limited to vaginal bleeding,  contractions, leaking of fluid and fetal movement were reviewed in detail with the patient. Please refer to After Visit Summary for other counseling recommendations.  Return in about 5 days (around 02/21/2017) for NST/AFI only;  3/1 or 3/2 Ob fu and NST.   JaWoodroe ModeMD

## 2017-02-16 NOTE — Progress Notes (Signed)
[place] for growth scheduled on 3/7.

## 2017-02-21 ENCOUNTER — Other Ambulatory Visit: Payer: Self-pay

## 2017-02-23 ENCOUNTER — Ambulatory Visit (INDEPENDENT_AMBULATORY_CARE_PROVIDER_SITE_OTHER): Payer: Medicaid Other | Admitting: Obstetrics and Gynecology

## 2017-02-23 ENCOUNTER — Ambulatory Visit: Payer: Self-pay

## 2017-02-23 VITALS — BP 115/76 | HR 85 | Wt 166.2 lb

## 2017-02-23 DIAGNOSIS — O10013 Pre-existing essential hypertension complicating pregnancy, third trimester: Secondary | ICD-10-CM | POA: Diagnosis not present

## 2017-02-23 DIAGNOSIS — Z3689 Encounter for other specified antenatal screening: Secondary | ICD-10-CM | POA: Diagnosis not present

## 2017-02-23 DIAGNOSIS — O10019 Pre-existing essential hypertension complicating pregnancy, unspecified trimester: Secondary | ICD-10-CM

## 2017-02-23 DIAGNOSIS — O0993 Supervision of high risk pregnancy, unspecified, third trimester: Secondary | ICD-10-CM

## 2017-02-23 NOTE — Progress Notes (Signed)
Pt informed that the ultrasound is considered a limited OB ultrasound and is not intended to be a complete ultrasound exam.  Patient also informed that the ultrasound is not being completed with the intent of assessing for fetal or placental anomalies or any pelvic abnormalities.  Explained that the purpose of today's ultrasound is to assess for presentation and amniotic fluid volume.  Patient acknowledges the purpose of the exam and the limitations of the study.    [place] scheduled for growth on 3/8

## 2017-02-23 NOTE — Progress Notes (Signed)
   PRENATAL VISIT NOTE  Subjective:  Shelly Adams is a 26 y.o. G2P1001 at 19w2dbeing seen today for ongoing prenatal care.  She is currently monitored for the following issues for this high-risk pregnancy and has Benign essential hypertension, antepartum; Fibroid, uterine; Asthma; History of gestational diabetes in prior pregnancy, currently pregnant; Supervision of high-risk pregnancy; and NASH (nonalcoholic steatohepatitis) on her problem list.  Patient reports no complaints.  Contractions: Irregular. Vag. Bleeding: None.  Movement: Present. Denies leaking of fluid.   The following portions of the patient's history were reviewed and updated as appropriate: allergies, current medications, past family history, past medical history, past social history, past surgical history and problem list. Problem list updated.  Objective:   Vitals:   02/23/17 0801  BP: 115/76  Pulse: 85  Weight: 166 lb 3.2 oz (75.4 kg)    Fetal Status: Fetal Heart Rate (bpm): NST   Movement: Present  Presentation: Vertex  General:  Alert, oriented and cooperative. Patient is in no acute distress.  Skin: Skin is warm and dry. No rash noted.   Cardiovascular: Normal heart rate noted  Respiratory: Normal respiratory effort, no problems with respiration noted  Abdomen: Soft, gravid, appropriate for gestational age. Pain/Pressure: Present     Pelvic:  Cervical exam deferred        Extremities: Normal range of motion.     Mental Status: Normal mood and affect. Normal behavior. Normal judgment and thought content.   Assessment and Plan:  Pregnancy: G2P1001 at 371w2d1. Benign essential hypertension, antepartum BP well controlled on Labetalol  Continue ASA Follow up growth ultrasound next week - Fetal nonstress test- NEST reviewed and reactive - [place] Limited  2. Supervision of high risk pregnancy in third trimester Patient is doing well without complaints  Preterm labor symptoms and general obstetric  precautions including but not limited to vaginal bleeding, contractions, leaking of fluid and fetal movement were reviewed in detail with the patient. Please refer to After Visit Summary for other counseling recommendations.  Return in about 4 days (around 02/27/2017) for NST;  3/8 Ob fu and NST - has [place] 0900.   PeMora BellmanMD

## 2017-02-27 ENCOUNTER — Ambulatory Visit (INDEPENDENT_AMBULATORY_CARE_PROVIDER_SITE_OTHER): Payer: Medicaid Other | Admitting: Obstetrics & Gynecology

## 2017-02-27 VITALS — BP 135/77 | HR 95

## 2017-02-27 DIAGNOSIS — O10019 Pre-existing essential hypertension complicating pregnancy, unspecified trimester: Secondary | ICD-10-CM

## 2017-02-27 DIAGNOSIS — O10013 Pre-existing essential hypertension complicating pregnancy, third trimester: Secondary | ICD-10-CM

## 2017-02-27 NOTE — Progress Notes (Signed)
NST performed today was reviewed and was found to be reactive.  Continue recommended antenatal testing and prenatal care.

## 2017-02-27 NOTE — Progress Notes (Signed)
[place] for growth scheduled on 3/8

## 2017-03-01 ENCOUNTER — Ambulatory Visit (HOSPITAL_COMMUNITY): Payer: Medicaid Other

## 2017-03-02 ENCOUNTER — Ambulatory Visit (HOSPITAL_COMMUNITY)
Admission: RE | Admit: 2017-03-02 | Discharge: 2017-03-02 | Disposition: A | Discharge status: Home / Self Care | Payer: Medicaid Other | Source: Ambulatory Visit | Attending: Family Medicine | Admitting: Family Medicine

## 2017-03-02 ENCOUNTER — Encounter (HOSPITAL_COMMUNITY): Payer: Self-pay

## 2017-03-02 ENCOUNTER — Encounter: Payer: Self-pay | Admitting: Obstetrics and Gynecology

## 2017-03-02 ENCOUNTER — Other Ambulatory Visit: Payer: Self-pay | Admitting: Obstetrics and Gynecology

## 2017-03-02 DIAGNOSIS — O09899 Supervision of other high risk pregnancies, unspecified trimester: Secondary | ICD-10-CM

## 2017-03-02 DIAGNOSIS — Z3A35 35 weeks gestation of pregnancy: Secondary | ICD-10-CM | POA: Insufficient documentation

## 2017-03-02 DIAGNOSIS — O10013 Pre-existing essential hypertension complicating pregnancy, third trimester: Secondary | ICD-10-CM | POA: Diagnosis not present

## 2017-03-02 DIAGNOSIS — O09293 Supervision of pregnancy with other poor reproductive or obstetric history, third trimester: Secondary | ICD-10-CM | POA: Insufficient documentation

## 2017-03-02 NOTE — Progress Notes (Signed)
Patient did not keep OB appointment for 03/02/2017.  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

## 2017-03-03 ENCOUNTER — Encounter: Payer: Self-pay | Admitting: Obstetrics and Gynecology

## 2017-03-03 ENCOUNTER — Other Ambulatory Visit (HOSPITAL_COMMUNITY)
Admission: RE | Admit: 2017-03-03 | Discharge: 2017-03-03 | Disposition: A | Discharge status: Home / Self Care | Payer: Medicaid Other | Source: Ambulatory Visit | Attending: Obstetrics and Gynecology | Admitting: Obstetrics and Gynecology

## 2017-03-03 ENCOUNTER — Ambulatory Visit (INDEPENDENT_AMBULATORY_CARE_PROVIDER_SITE_OTHER): Payer: Medicaid Other | Admitting: Obstetrics and Gynecology

## 2017-03-03 VITALS — BP 131/92 | HR 107 | Wt 166.9 lb

## 2017-03-03 DIAGNOSIS — O0993 Supervision of high risk pregnancy, unspecified, third trimester: Secondary | ICD-10-CM

## 2017-03-03 DIAGNOSIS — Z113 Encounter for screening for infections with a predominantly sexual mode of transmission: Secondary | ICD-10-CM | POA: Diagnosis not present

## 2017-03-03 DIAGNOSIS — O10019 Pre-existing essential hypertension complicating pregnancy, unspecified trimester: Secondary | ICD-10-CM

## 2017-03-03 DIAGNOSIS — O10013 Pre-existing essential hypertension complicating pregnancy, third trimester: Secondary | ICD-10-CM

## 2017-03-03 LAB — POCT URINALYSIS DIP (DEVICE)
Bilirubin Urine: NEGATIVE
Glucose, UA: NEGATIVE mg/dL
Ketones, ur: NEGATIVE mg/dL
NITRITE: NEGATIVE
Protein, ur: NEGATIVE mg/dL
Specific Gravity, Urine: 1.015 (ref 1.005–1.030)
UROBILINOGEN UA: 0.2 mg/dL (ref 0.0–1.0)
pH: 7 (ref 5.0–8.0)

## 2017-03-03 NOTE — Progress Notes (Signed)
Pt states she is having difficulty sleeping - Benadryl not working.  She has a headache today - pain scale 5. She denies visual disturbances.  [place] for growth done yesterday.

## 2017-03-03 NOTE — Progress Notes (Signed)
Prenatal Visit Note Date: 03/03/2017 Clinic: [facility]  Subjective:  Shelly Adams is a 26 y.o. G2P1001 at 22w3dbeing seen today for ongoing prenatal care.  She is currently monitored for the following issues for this high-risk pregnancy and has Benign essential hypertension, antepartum; Asthma; Supervision of high-risk pregnancy; and NASH (nonalcoholic steatohepatitis) on her problem list.  Patient reports difficulty with sleeping. Occasional HAs but none currently and no s/s of pre-eclampsia Contractions: Irregular. Vag. Bleeding: None.  Movement: Present. Denies leaking of fluid.   The following portions of the patient's history were reviewed and updated as appropriate: allergies, current medications, past family history, past medical history, past social history, past surgical history and problem list. Problem list updated.  Objective:   Vitals:   03/03/17 0908  BP: (!) 131/92  Pulse: (!) 107  Weight: 166 lb 14.4 oz (75.7 kg)    Fetal Status: Fetal Heart Rate (bpm): NST   Movement: Present  Presentation: Vertex  General:  Alert, oriented and cooperative. Patient is in no acute distress.  Skin: Skin is warm and dry. No rash noted.   Cardiovascular: Normal heart rate noted  Respiratory: Normal respiratory effort, no problems with respiration noted  Abdomen: Soft, gravid, appropriate for gestational age. Pain/Pressure: Present     Pelvic:  Cervical exam deferred        Extremities: Normal range of motion.  Edema: None  Mental Status: Normal mood and affect. Normal behavior. Normal judgment and thought content.   Urinalysis:      Assessment and Plan:  Pregnancy: G2P1001 at 329w3d1. Benign essential hypertension, antepartum Routine care. Continue 2x/week testing. Normal efw/ac/afi yesterday. Continue labetalol 100 bid. Will get pre-x labs today given occasional s/s. D/w her that likely occasional HAs could be due to issues with sleep. Behavioral  modifications d/w her and can try benadryl 50 qhs prn - Fetal nonstress test - Protein / creatinine ratio, urine - CBC - Comprehensive metabolic panel  2. Supervision of high risk pregnancy in third trimester Routine care.  - Strep Gp B NAA - GC/Chlamydia probe amp ([facility]term labor symptoms and general obstetric precautions including but not limited to vaginal bleeding, contractions, leaking of fluid and fetal movement were reviewed in detail with the patient. Please refer to After Visit Summary for other counseling recommendations.  Return in about 3 days (around 03/06/2017) for NST only;  3/15  Ob fu and NST/AFI.   ChAletha HalimMD

## 2017-03-04 LAB — CBC
HEMOGLOBIN: 10.9 g/dL — AB (ref 11.1–15.9)
Hematocrit: 33.4 % — ABNORMAL LOW (ref 34.0–46.6)
MCH: 28 pg (ref 26.6–33.0)
MCHC: 32.6 g/dL (ref 31.5–35.7)
MCV: 86 fL (ref 79–97)
Platelets: 204 10*3/uL (ref 150–379)
RBC: 3.89 x10E6/uL (ref 3.77–5.28)
RDW: 14.4 % (ref 12.3–15.4)
WBC: 8.3 10*3/uL (ref 3.4–10.8)

## 2017-03-04 LAB — COMPREHENSIVE METABOLIC PANEL
ALBUMIN: 3.5 g/dL (ref 3.5–5.5)
ALK PHOS: 124 IU/L — AB (ref 39–117)
ALT: 15 IU/L (ref 0–32)
AST: 22 IU/L (ref 0–40)
Albumin/Globulin Ratio: 1.3 (ref 1.2–2.2)
BUN / CREAT RATIO: 9 (ref 9–23)
BUN: 4 mg/dL — AB (ref 6–20)
Bilirubin Total: <0.2 mg/dL (ref 0.0–1.2)
CHLORIDE: 99 mmol/L (ref 96–106)
CO2: 24 mmol/L (ref 18–29)
Calcium: 9.2 mg/dL (ref 8.7–10.2)
Creatinine, Ser: 0.47 mg/dL — ABNORMAL LOW (ref 0.57–1.00)
GFR calc non Af Amer: 137 mL/min/{1.73_m2} (ref >59)
GFR, EST AFRICAN AMERICAN: 158 mL/min/{1.73_m2} (ref >59)
GLOBULIN, TOTAL: 2.6 g/dL (ref 1.5–4.5)
Glucose: 91 mg/dL (ref 65–99)
Potassium: 3.7 mmol/L (ref 3.5–5.2)
SODIUM: 137 mmol/L (ref 134–144)
TOTAL PROTEIN: 6.1 g/dL (ref 6.0–8.5)

## 2017-03-04 LAB — PROTEIN / CREATININE RATIO, URINE
Creatinine, Urine: 101.7 mg/dL
Protein, Ur: 33.3 mg/dL
Protein/Creat Ratio: 327 mg/g creat — ABNORMAL HIGH (ref 0–200)

## 2017-03-05 ENCOUNTER — Encounter: Payer: Self-pay | Admitting: Obstetrics and Gynecology

## 2017-03-05 DIAGNOSIS — O99820 Streptococcus B carrier state complicating pregnancy: Secondary | ICD-10-CM | POA: Insufficient documentation

## 2017-03-05 HISTORY — DX: Streptococcus B carrier state complicating pregnancy: O99.820

## 2017-03-05 LAB — STREP GP B NAA: STREP GROUP B AG: POSITIVE — AB

## 2017-03-06 ENCOUNTER — Telehealth: Payer: Self-pay | Admitting: *Deleted

## 2017-03-06 ENCOUNTER — Other Ambulatory Visit: Payer: Self-pay | Admitting: Family Medicine

## 2017-03-06 LAB — GC/CHLAMYDIA PROBE AMP (~~LOC~~) NOT AT ARMC
CHLAMYDIA, DNA PROBE: NEGATIVE
NEISSERIA GONORRHEA: NEGATIVE

## 2017-03-06 NOTE — Telephone Encounter (Signed)
Called pt and informed her that the office is closing early today due to inclement weather. Pt confirmed good FM daily. She reported the same headaches as at her last visit and has not tried Tylenol. She denied visual disturbances. Lab results from 3/9 reviewed and pt was informed. Pt was advised to take tylenol per package directions and to keep office appt as scheduled on 3/15. She should go to [facility] if her headache becomes worse or if visual sx occur. Pt voiced understanding and agreed to plan of care.

## 2017-03-09 ENCOUNTER — Ambulatory Visit: Payer: Self-pay

## 2017-03-09 ENCOUNTER — Ambulatory Visit (INDEPENDENT_AMBULATORY_CARE_PROVIDER_SITE_OTHER): Payer: Medicaid Other | Admitting: Obstetrics & Gynecology

## 2017-03-09 VITALS — BP 133/88 | HR 101 | Wt 167.2 lb

## 2017-03-09 DIAGNOSIS — Z3689 Encounter for other specified antenatal screening: Secondary | ICD-10-CM

## 2017-03-09 DIAGNOSIS — O10013 Pre-existing essential hypertension complicating pregnancy, third trimester: Secondary | ICD-10-CM

## 2017-03-09 DIAGNOSIS — O10019 Pre-existing essential hypertension complicating pregnancy, unspecified trimester: Secondary | ICD-10-CM

## 2017-03-09 DIAGNOSIS — O0993 Supervision of high risk pregnancy, unspecified, third trimester: Secondary | ICD-10-CM

## 2017-03-09 LAB — POCT URINALYSIS DIP (DEVICE)
BILIRUBIN URINE: NEGATIVE
Glucose, UA: NEGATIVE mg/dL
Ketones, ur: NEGATIVE mg/dL
Leukocytes, UA: NEGATIVE
NITRITE: NEGATIVE
PH: 7 (ref 5.0–8.0)
PROTEIN: NEGATIVE mg/dL
Specific Gravity, Urine: 1.02 (ref 1.005–1.030)
Urobilinogen, UA: 0.2 mg/dL (ref 0.0–1.0)

## 2017-03-09 MED ORDER — LABETALOL HCL 200 MG PO TABS: 200.0000 mg | ORAL_TABLET | Freq: Two times a day (BID) | ORAL | 1 refills | Status: DC

## 2017-03-09 NOTE — Patient Instructions (Signed)
Labor Induction Labor induction is when steps are taken to cause a pregnant woman to begin the labor process. Most women go into labor on their own between 37 weeks and 42 weeks of the pregnancy. When this does not happen or when there is a medical need, methods may be used to induce labor. Labor induction causes a pregnant woman's uterus to contract. It also causes the cervix to soften (ripen), open (dilate), and thin out (efface). Usually, labor is not induced before 39 weeks of the pregnancy unless there is a problem with the baby or mother. Before inducing labor, your health care provider will consider a number of factors, including the following:  The medical condition of you and the baby.  How many weeks along you are.  The status of the baby's lung maturity.  The condition of the cervix.  The position of the baby. What are the reasons for labor induction? Labor may be induced for the following reasons:  The health of the baby or mother is at risk.  The pregnancy is overdue by 1 week or more.  The water breaks but labor does not start on its own.  The mother has a health condition or serious illness, such as high blood pressure, infection, placental abruption, or diabetes.  The amniotic fluid amounts are low around the baby.  The baby is distressed. Convenience or wanting the baby to be born on a certain date is not a reason for inducing labor. What methods are used for labor induction? Several methods of labor induction may be used, such as:  Prostaglandin medicine. This medicine causes the cervix to dilate and ripen. The medicine will also start contractions. It can be taken by mouth or by inserting a suppository into the vagina.  Inserting a thin tube (catheter) with a balloon on the end into the vagina to dilate the cervix. Once inserted, the balloon is expanded with water, which causes the cervix to open.  Stripping the membranes. Your health care provider separates  amniotic sac tissue from the cervix, causing the cervix to be stretched and causing the release of a hormone called progesterone. This may cause the uterus to contract. It is often done during an office visit. You will be sent home to wait for the contractions to begin. You will then come in for an induction.  Breaking the water. Your health care provider makes a hole in the amniotic sac using a small instrument. Once the amniotic sac breaks, contractions should begin. This may still take hours to see an effect.  Medicine to trigger or strengthen contractions. This medicine is given through an IV access tube inserted into a vein in your arm. All of the methods of induction, besides stripping the membranes, will be done in the hospital. Induction is done in the hospital so that you and the baby can be carefully monitored. How long does it take for labor to be induced? Some inductions can take up to 2-3 days. Depending on the cervix, it usually takes less time. It takes longer when you are induced early in the pregnancy or if this is your first pregnancy. If a mother is still pregnant and the induction has been going on for 2-3 days, either the mother will be sent home or a cesarean delivery will be needed. What are the risks associated with labor induction? Some of the risks of induction include:  Changes in fetal heart rate, such as too high, too low, or erratic.  Fetal distress.    Chance of infection for the mother and baby.  Increased chance of having a cesarean delivery.  Breaking off (abruption) of the placenta from the uterus (rare).  Uterine rupture (very rare). When induction is needed for medical reasons, the benefits of induction may outweigh the risks. What are some reasons for not inducing labor? Labor induction should not be done if:  It is shown that your baby does not tolerate labor.  You have had previous surgeries on your uterus, such as a myomectomy or the removal of  fibroids.  Your placenta lies very low in the uterus and blocks the opening of the cervix (placenta previa).  Your baby is not in a head-down position.  The umbilical cord drops down into the birth canal in front of the baby. This could cut off the baby's blood and oxygen supply.  You have had a previous cesarean delivery.  There are unusual circumstances, such as the baby being extremely premature. This information is not intended to replace advice given to you by your health care provider. Make sure you discuss any questions you have with your health care provider. Document Released: 05/03/2007 Document Revised: 05/19/2016 Document Reviewed: 07/11/2013 Elsevier Interactive Patient Education  2017 [employer]  

## 2017-03-09 NOTE — Progress Notes (Signed)
Pt informed that the ultrasound is considered a limited OB ultrasound and is not intended to be a complete ultrasound exam.  Patient also informed that the ultrasound is not being completed with the intent of assessing for fetal or placental anomalies or any pelvic abnormalities.  Explained that the purpose of today's ultrasound is to assess for presentation and amniotic fluid volume.  Patient acknowledges the purpose of the exam and the limitations of the study.    Pt ran out of Labetalol- has not taken in 2 days - has been having headaches. Rx sent to pharmacy

## 2017-03-09 NOTE — Progress Notes (Signed)
Mild headache responds to Tylenol but returns daily   PRENATAL VISIT NOTE  Subjective:  Shelly Adams is a 26 y.o. G2P1001 at 49w2dbeing seen today for ongoing prenatal care.  She is currently monitored for the following issues for this high-risk pregnancy and has Benign essential hypertension, antepartum; Asthma; Supervision of high-risk pregnancy; NASH (nonalcoholic steatohepatitis); and GBS (group B Streptococcus carrier), +RV culture, currently pregnant on her problem list.  Patient reports headache.  Contractions: Irregular. Vag. Bleeding: None.  Movement: Present. Denies leaking of fluid.   The following portions of the patient's history were reviewed and updated as appropriate: allergies, current medications, past family history, past medical history, past social history, past surgical history and problem list. Problem list updated.  Objective:   Vitals:   03/09/17 0856 03/09/17 0938  BP: (!) 136/93 133/88  Pulse: (!) 101   Weight: 167 lb 3.2 oz (75.8 kg)     Fetal Status: Fetal Heart Rate (bpm): NST   Movement: Present  Presentation: Vertex  General:  Alert, oriented and cooperative. Patient is in no acute distress.  Skin: Skin is warm and dry. No rash noted.   Cardiovascular: Normal heart rate noted  Respiratory: Normal respiratory effort, no problems with respiration noted  Abdomen: Soft, gravid, appropriate for gestational age. Pain/Pressure: Present     Pelvic:  Cervical exam deferred        Extremities: Normal range of motion.     Mental Status: Normal mood and affect. Normal behavior. Normal judgment and thought content.   Assessment and Plan:  Pregnancy: G2P1001 at 365w2d1. Benign essential hypertension, antepartum Reactive NST today - Fetal nonstress test - [place]; Future  2. Supervision of high risk pregnancy in third trimester Mild headache and stable BP, mildly elevated P/C ratio, will [facility] for indication for earlier  induction if preeclamptic  Term labor symptoms and general obstetric precautions including but not limited to vaginal bleeding, contractions, leaking of fluid and fetal movement were reviewed in detail with the patient. Please refer to After Visit Summary for other counseling recommendations.  Return in about 4 days (around 03/13/2017) for NSt only;  3/22 Ob fu and NST/AFI.   JaWoodroe ModeMD

## 2017-03-13 ENCOUNTER — Ambulatory Visit (INDEPENDENT_AMBULATORY_CARE_PROVIDER_SITE_OTHER): Payer: Medicaid Other | Admitting: Obstetrics & Gynecology

## 2017-03-13 VITALS — BP 131/77 | HR 112

## 2017-03-13 DIAGNOSIS — O10019 Pre-existing essential hypertension complicating pregnancy, unspecified trimester: Secondary | ICD-10-CM

## 2017-03-13 DIAGNOSIS — O10013 Pre-existing essential hypertension complicating pregnancy, third trimester: Secondary | ICD-10-CM

## 2017-03-13 DIAGNOSIS — O0993 Supervision of high risk pregnancy, unspecified, third trimester: Secondary | ICD-10-CM

## 2017-03-13 NOTE — Progress Notes (Signed)
NST reactive and reassuring on 03/13/17

## 2017-03-15 ENCOUNTER — Inpatient Hospital Stay (HOSPITAL_COMMUNITY)
Admission: AD | Admit: 2017-03-15 | Discharge: 2017-03-17 | DRG: 774 | Disposition: A | Discharge status: Home / Self Care | Payer: Medicaid Other | Source: Ambulatory Visit | Attending: Obstetrics and Gynecology | Admitting: Obstetrics and Gynecology

## 2017-03-15 ENCOUNTER — Encounter (HOSPITAL_COMMUNITY): Payer: Self-pay

## 2017-03-15 ENCOUNTER — Inpatient Hospital Stay (HOSPITAL_COMMUNITY): Payer: Medicaid Other | Admitting: Anesthesiology

## 2017-03-15 DIAGNOSIS — O1092 Unspecified pre-existing hypertension complicating childbirth: Secondary | ICD-10-CM | POA: Diagnosis not present

## 2017-03-15 DIAGNOSIS — Z3A38 38 weeks gestation of pregnancy: Secondary | ICD-10-CM | POA: Diagnosis not present

## 2017-03-15 DIAGNOSIS — O99824 Streptococcus B carrier state complicating childbirth: Secondary | ICD-10-CM | POA: Diagnosis present

## 2017-03-15 DIAGNOSIS — O4202 Full-term premature rupture of membranes, onset of labor within 24 hours of rupture: Secondary | ICD-10-CM | POA: Diagnosis present

## 2017-03-15 DIAGNOSIS — J45909 Unspecified asthma, uncomplicated: Secondary | ICD-10-CM | POA: Diagnosis present

## 2017-03-15 DIAGNOSIS — K7581 Nonalcoholic steatohepatitis (NASH): Secondary | ICD-10-CM

## 2017-03-15 DIAGNOSIS — O1002 Pre-existing essential hypertension complicating childbirth: Secondary | ICD-10-CM | POA: Diagnosis present

## 2017-03-15 DIAGNOSIS — O9952 Diseases of the respiratory system complicating childbirth: Secondary | ICD-10-CM | POA: Diagnosis present

## 2017-03-15 DIAGNOSIS — O0993 Supervision of high risk pregnancy, unspecified, third trimester: Secondary | ICD-10-CM

## 2017-03-15 LAB — COMPREHENSIVE METABOLIC PANEL
ALBUMIN: 3 g/dL — AB (ref 3.5–5.0)
ALT: 19 U/L (ref 14–54)
ANION GAP: 9 (ref 5–15)
AST: 25 U/L (ref 15–41)
Alkaline Phosphatase: 127 U/L — ABNORMAL HIGH (ref 38–126)
BUN: 8 mg/dL (ref 6–20)
CO2: 22 mmol/L (ref 22–32)
Calcium: 8.6 mg/dL — ABNORMAL LOW (ref 8.9–10.3)
Chloride: 103 mmol/L (ref 101–111)
Creatinine, Ser: 0.57 mg/dL (ref 0.44–1.00)
GFR calc Af Amer: >60 mL/min (ref >60)
GFR calc non Af Amer: >60 mL/min (ref >60)
GLUCOSE: 96 mg/dL (ref 65–99)
POTASSIUM: 4.1 mmol/L (ref 3.5–5.1)
SODIUM: 134 mmol/L — AB (ref 135–145)
TOTAL PROTEIN: 6.4 g/dL — AB (ref 6.5–8.1)
Total Bilirubin: 0.6 mg/dL (ref 0.3–1.2)

## 2017-03-15 LAB — URINALYSIS, ROUTINE W REFLEX MICROSCOPIC
BILIRUBIN URINE: NEGATIVE
Glucose, UA: NEGATIVE mg/dL
Ketones, ur: 5 mg/dL — AB
LEUKOCYTES UA: NEGATIVE
Nitrite: NEGATIVE
PROTEIN: NEGATIVE mg/dL
Specific Gravity, Urine: 1.009 (ref 1.005–1.030)
pH: 8 (ref 5.0–8.0)

## 2017-03-15 LAB — TYPE AND SCREEN
ABO/RH(D): O POS
ANTIBODY SCREEN: NEGATIVE

## 2017-03-15 LAB — CBC
HEMATOCRIT: 33.2 % — AB (ref 36.0–46.0)
HEMATOCRIT: 32.4 % — AB (ref 36.0–46.0)
HEMOGLOBIN: 10.7 g/dL — AB (ref 12.0–15.0)
Hemoglobin: 11 g/dL — ABNORMAL LOW (ref 12.0–15.0)
MCH: 28.2 pg (ref 26.0–34.0)
MCH: 27.9 pg (ref 26.0–34.0)
MCHC: 33.1 g/dL (ref 30.0–36.0)
MCHC: 33 g/dL (ref 30.0–36.0)
MCV: 85.1 fL (ref 78.0–100.0)
MCV: 84.4 fL (ref 78.0–100.0)
PLATELETS: 192 10*3/uL (ref 150–400)
Platelets: 179 10*3/uL (ref 150–400)
RBC: 3.9 MIL/uL (ref 3.87–5.11)
RBC: 3.84 MIL/uL — ABNORMAL LOW (ref 3.87–5.11)
RDW: 14.2 % (ref 11.5–15.5)
RDW: 14 % (ref 11.5–15.5)
WBC: 8.1 10*3/uL (ref 4.0–10.5)
WBC: 12.5 10*3/uL — ABNORMAL HIGH (ref 4.0–10.5)

## 2017-03-15 LAB — PROTEIN / CREATININE RATIO, URINE
Creatinine, Urine: 72 mg/dL
PROTEIN CREATININE RATIO: 0.24 mg/mg{creat} — AB (ref 0.00–0.15)
Total Protein, Urine: 17 mg/dL

## 2017-03-15 LAB — POCT FERN TEST: POCT FERN TEST: POSITIVE

## 2017-03-15 MED ORDER — ACETAMINOPHEN 325 MG PO TABS: 650.0000 mg | ORAL_TABLET | ORAL | Status: DC | PRN

## 2017-03-15 MED ORDER — EPHEDRINE 5 MG/ML INJ
10.0000 mg | INTRAVENOUS | Status: DC | PRN
  Filled 2017-03-15: qty 2

## 2017-03-15 MED ORDER — LACTATED RINGERS IV SOLN
INTRAVENOUS | Status: DC
  Administered 2017-03-15 (×2): via INTRAVENOUS

## 2017-03-15 MED ORDER — PENICILLIN G POTASSIUM 5000000 UNITS IJ SOLR
5.0000 10*6.[IU] | Freq: Once | INTRAVENOUS | Status: AC
  Administered 2017-03-15: 5 10*6.[IU] via INTRAVENOUS
  Filled 2017-03-15: qty 5

## 2017-03-15 MED ORDER — COCONUT OIL OIL: 1.0000 "application " | TOPICAL_OIL | Status: DC | PRN

## 2017-03-15 MED ORDER — DIPHENHYDRAMINE HCL 50 MG/ML IJ SOLN: 12.5000 mg | INTRAMUSCULAR | Status: DC | PRN

## 2017-03-15 MED ORDER — OXYCODONE-ACETAMINOPHEN 5-325 MG PO TABS
1.0000 | ORAL_TABLET | ORAL | Status: DC | PRN
  Administered 2017-03-16 – 2017-03-17 (×4): 1 via ORAL
  Filled 2017-03-15 (×5): qty 1

## 2017-03-15 MED ORDER — OXYTOCIN 40 UNITS IN LACTATED RINGERS INFUSION - SIMPLE MED
1.0000 m[IU]/min | INTRAVENOUS | Status: DC
Start: 2017-03-15 — End: 2017-03-15
  Administered 2017-03-15: 2 m[IU]/min via INTRAVENOUS

## 2017-03-15 MED ORDER — PHENYLEPHRINE 40 MCG/ML (10ML) SYRINGE FOR IV PUSH (FOR BLOOD PRESSURE SUPPORT)
80.0000 ug | PREFILLED_SYRINGE | INTRAVENOUS | Status: DC | PRN
  Filled 2017-03-15: qty 5
  Filled 2017-03-15: qty 10

## 2017-03-15 MED ORDER — LACTATED RINGERS IV SOLN
500.0000 mL | INTRAVENOUS | Status: DC | PRN
  Administered 2017-03-15: 500 mL via INTRAVENOUS

## 2017-03-15 MED ORDER — LIDOCAINE HCL (PF) 1 % IJ SOLN
INTRAMUSCULAR | Status: DC | PRN
  Administered 2017-03-15: 13 mL via EPIDURAL

## 2017-03-15 MED ORDER — ONDANSETRON HCL 4 MG/2ML IJ SOLN: 4.0000 mg | Freq: Four times a day (QID) | INTRAMUSCULAR | Status: DC | PRN

## 2017-03-15 MED ORDER — SENNOSIDES-DOCUSATE SODIUM 8.6-50 MG PO TABS
2.0000 | ORAL_TABLET | ORAL | Status: DC
  Administered 2017-03-16: 2 via ORAL
  Filled 2017-03-15 (×2): qty 2

## 2017-03-15 MED ORDER — WITCH HAZEL-GLYCERIN EX PADS: 1.0000 "application " | MEDICATED_PAD | CUTANEOUS | Status: DC | PRN

## 2017-03-15 MED ORDER — BENZOCAINE-MENTHOL 20-0.5 % EX AERO: 1.0000 "application " | INHALATION_SPRAY | CUTANEOUS | Status: DC | PRN

## 2017-03-15 MED ORDER — SOD CITRATE-CITRIC ACID 500-334 MG/5ML PO SOLN: 30.0000 mL | ORAL | Status: DC | PRN

## 2017-03-15 MED ORDER — TERBUTALINE SULFATE 1 MG/ML IJ SOLN
0.2500 mg | Freq: Once | INTRAMUSCULAR | Status: DC | PRN
  Filled 2017-03-15: qty 1

## 2017-03-15 MED ORDER — ZOLPIDEM TARTRATE 5 MG PO TABS: 5.0000 mg | ORAL_TABLET | Freq: Every evening | ORAL | Status: DC | PRN

## 2017-03-15 MED ORDER — FENTANYL 2.5 MCG/ML BUPIVACAINE 1/10 % EPIDURAL INFUSION (WH - ANES)
14.0000 mL/h | INTRAMUSCULAR | Status: DC | PRN
  Administered 2017-03-15 (×2): 14 mL/h via EPIDURAL
  Filled 2017-03-15 (×2): qty 100

## 2017-03-15 MED ORDER — TETANUS-DIPHTH-ACELL PERTUSSIS 5-2.5-18.5 LF-MCG/0.5 IM SUSP: 0.5000 mL | Freq: Once | INTRAMUSCULAR | Status: DC

## 2017-03-15 MED ORDER — PHENYLEPHRINE 40 MCG/ML (10ML) SYRINGE FOR IV PUSH (FOR BLOOD PRESSURE SUPPORT)
80.0000 ug | PREFILLED_SYRINGE | INTRAVENOUS | Status: DC | PRN
  Filled 2017-03-15: qty 5

## 2017-03-15 MED ORDER — IBUPROFEN 600 MG PO TABS
600.0000 mg | ORAL_TABLET | Freq: Four times a day (QID) | ORAL | Status: DC
  Administered 2017-03-15 – 2017-03-17 (×7): 600 mg via ORAL
  Filled 2017-03-15 (×7): qty 1

## 2017-03-15 MED ORDER — LABETALOL HCL 100 MG PO TABS: 200.0000 mg | ORAL_TABLET | Freq: Two times a day (BID) | ORAL | Status: DC

## 2017-03-15 MED ORDER — PENICILLIN G POT IN DEXTROSE 60000 UNIT/ML IV SOLN
3.0000 10*6.[IU] | INTRAVENOUS | Status: DC
  Administered 2017-03-15 (×3): 3 10*6.[IU] via INTRAVENOUS
  Filled 2017-03-15 (×5): qty 50

## 2017-03-15 MED ORDER — AMLODIPINE BESYLATE 5 MG PO TABS
5.0000 mg | ORAL_TABLET | Freq: Every day | ORAL | Status: DC
  Administered 2017-03-16 – 2017-03-17 (×2): 5 mg via ORAL
  Filled 2017-03-15 (×3): qty 1

## 2017-03-15 MED ORDER — LACTATED RINGERS IV SOLN: 500.0000 mL | Freq: Once | INTRAVENOUS | Status: DC

## 2017-03-15 MED ORDER — DIPHENHYDRAMINE HCL 25 MG PO CAPS: 25.0000 mg | ORAL_CAPSULE | Freq: Four times a day (QID) | ORAL | Status: DC | PRN

## 2017-03-15 MED ORDER — LIDOCAINE HCL (PF) 1 % IJ SOLN
30.0000 mL | INTRAMUSCULAR | Status: DC | PRN
  Filled 2017-03-15: qty 30

## 2017-03-15 MED ORDER — ONDANSETRON HCL 4 MG/2ML IJ SOLN: 4.0000 mg | INTRAMUSCULAR | Status: DC | PRN

## 2017-03-15 MED ORDER — PRENATAL MULTIVITAMIN CH
1.0000 | ORAL_TABLET | Freq: Every day | ORAL | Status: DC
  Administered 2017-03-16 – 2017-03-17 (×2): 1 via ORAL
  Filled 2017-03-15 (×2): qty 1

## 2017-03-15 MED ORDER — FENTANYL 2.5 MCG/ML BUPIVACAINE 1/10 % EPIDURAL INFUSION (WH - ANES): 14.0000 mL/h | INTRAMUSCULAR | Status: DC | PRN

## 2017-03-15 MED ORDER — LABETALOL HCL 200 MG PO TABS
200.0000 mg | ORAL_TABLET | Freq: Two times a day (BID) | ORAL | Status: DC
  Administered 2017-03-15: 200 mg via ORAL
  Filled 2017-03-15: qty 2

## 2017-03-15 MED ORDER — FENTANYL CITRATE (PF) 100 MCG/2ML IJ SOLN: 100.0000 ug | INTRAMUSCULAR | Status: DC | PRN

## 2017-03-15 MED ORDER — OXYCODONE-ACETAMINOPHEN 5-325 MG PO TABS: 2.0000 | ORAL_TABLET | ORAL | Status: DC | PRN

## 2017-03-15 MED ORDER — ONDANSETRON HCL 4 MG PO TABS: 4.0000 mg | ORAL_TABLET | ORAL | Status: DC | PRN

## 2017-03-15 MED ORDER — OXYTOCIN BOLUS FROM INFUSION
500.0000 mL | Freq: Once | INTRAVENOUS | Status: AC
  Administered 2017-03-15: 500 mL via INTRAVENOUS

## 2017-03-15 MED ORDER — SIMETHICONE 80 MG PO CHEW: 80.0000 mg | CHEWABLE_TABLET | ORAL | Status: DC | PRN

## 2017-03-15 MED ORDER — DIBUCAINE 1 % RE OINT: 1.0000 "application " | TOPICAL_OINTMENT | RECTAL | Status: DC | PRN

## 2017-03-15 MED ORDER — FENTANYL CITRATE (PF) 100 MCG/2ML IJ SOLN
50.0000 ug | INTRAMUSCULAR | Status: DC | PRN
  Administered 2017-03-15 (×2): 50 ug via INTRAVENOUS
  Filled 2017-03-15 (×2): qty 2

## 2017-03-15 MED ORDER — OXYTOCIN 40 UNITS IN LACTATED RINGERS INFUSION - SIMPLE MED
2.5000 [IU]/h | INTRAVENOUS | Status: DC
  Administered 2017-03-15: 2.5 [IU]/h via INTRAVENOUS
  Filled 2017-03-15: qty 1000

## 2017-03-15 NOTE — Anesthesia Preprocedure Evaluation (Signed)
Anesthesia Evaluation  Patient identified by MRN, date of birth, ID band Patient awake    Reviewed: Allergy & Precautions, H&P , NPO status , Patient's Chart, lab work & pertinent test results  Airway Mallampati: II  TM Distance: >3 FB Neck ROM: full    Dental no notable dental hx.    Pulmonary    Pulmonary exam normal        Cardiovascular hypertension, Pt. on home beta blockers Normal cardiovascular exam     Neuro/Psych negative neurological ROS     GI/Hepatic negative GI ROS, Neg liver ROS,   Endo/Other  diabetes, Gestational  Renal/GU negative Renal ROS  negative genitourinary   Musculoskeletal   Abdominal Normal abdominal exam  (+)   Peds  Hematology negative hematology ROS (+)   Anesthesia Other Findings   Reproductive/Obstetrics (+) Pregnancy                             Anesthesia Physical  Anesthesia Plan  ASA: III  Anesthesia Plan: Epidural   Post-op Pain Management:    Induction:   Airway Management Planned:   Additional Equipment:   Intra-op Plan:   Post-operative Plan:   Informed Consent: I have reviewed the patients History and Physical, chart, labs and discussed the procedure including the risks, benefits and alternatives for the proposed anesthesia with the patient or authorized representative who has indicated his/her understanding and acceptance.     Plan Discussed with:   Anesthesia Plan Comments:         Anesthesia Quick Evaluation

## 2017-03-15 NOTE — Progress Notes (Signed)
Shelly Adams is a 26 y.o. G2P1001 at [redacted]w[redacted]d admitted for active labor  Subjective:  Patient just got an epidural. She is resting comfortably at this time.  Objective: BP 135/84   Pulse 81   Temp 98.3 F (36.8 C) (Oral)   Resp 18   Ht 5' 1"  (1.549 m)   Wt 166 lb (75.3 kg)   LMP 06/27/2016 (Approximate)   SpO2 99%   BMI 31.37 kg/m  No intake/output data recorded. No intake/output data recorded.  FHT:  FHR: 120 bpm, variability: moderate,  accelerations:  Present,  decelerations:  Absent UC:   regular, every 2 minutes SVE:   Dilation: 5.5 Effacement (%): 80 Station: -2 Exam by:: christina woods rn  Labs: Lab Results  Component Value Date   WBC 12.5 (H) 03/15/2017   HGB 10.7 (L) 03/15/2017   HCT 32.4 (L) 03/15/2017   MCV 84.4 03/15/2017   PLT 179 03/15/2017    Assessment / Plan: Protracted latent phase  Labor: patient just got an epidural DW the patient about starting pitocin now. R/B discussed. Patient would like to wait before starting pitocin. Will plan to recheck in an hour. If no change then will consider pitocin at that time.  Preeclampsia:  NA Fetal Wellbeing:  Category I Pain Control:  Epidural I/D:  n/a Anticipated MOD:  NSVD  HMathis Bud3/21/2018, 12:27 PM

## 2017-03-15 NOTE — H&P (Signed)
LABOR AND DELIVERY ADMISSION HISTORY AND PHYSICAL NOTE  Shelly Adams is a 26 y.o. female G2P1001 with IUP at 82w1dby LMP c/w 19 week UKoreapresenting for spontaneous rupture of membranes. She started noticing leaking of fluid around 11PM yesterday.  Contractions started around 12 AM.  Getting stronger/more frequent, now every 7 minutes or so.  She started having a headache about 1 hour ago, it is associated with photophobia.  She has not taken anything for it.  She does have chronic hypertension, but is not checking her Bps outside of the office where they have been 130s/70s recently.  She denies fevers, blurred vision, scotoma, dyspnea, chest pain, RUQ pain, dysuria, LE swelling.  She reports positive fetal movement.  She has had a small amount of bloody show.  Prenatal History/Complications:  Past Medical History: Past Medical History:  Diagnosis Date  . Anxiety   . Fatty liver disease, nonalcoholic   . 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 Gestational diabetes   . Headache   . Hyperlipemia   . Hypertension   . Insomnia   . Recurrent kidney stones     Past Surgical History: Past Surgical History:  Procedure Laterality Date  . BREAST REDUCTION SURGERY  2009    Obstetrical History: OB History    Gravida Para Term Preterm AB Living   2 1 1     1    SAB TAB Ectopic Multiple Live Births           1      Social History: Social History   Social History  . Marital status: Single    Spouse name: N/A  . Number of children: 1  . Years of education: College    Occupational History  . Pharmacy Tech     Social History Main Topics  . Smoking status: Never Smoker  . Smokeless tobacco: Never Used  . Alcohol use No     Comment: occasionally   . Drug use: No  . Sexual activity: Not Asked   Other Topics Concern  . None   Social History Narrative   Rarely drinks caffeine beverages     Family History: Family History  Problem Relation Age of Onset  . Hypertension Mother   .  Hyperlipidemia Mother   . GER disease Mother   . Hyperlipidemia Father   . Hypertension Father   . Colon cancer Neg Hx     Allergies: Allergies  Allergen Reactions  . Tramadol Itching    Prescriptions Prior to Admission  Medication Sig Dispense Refill Last Dose  . albuterol (PROVENTIL HFA;VENTOLIN HFA) 108 (90 BASE) MCG/ACT inhaler Inhale 1 puff into the lungs every 6 (six) hours as needed for wheezing.   Not Taking  . aspirin EC 81 MG tablet Take 1 tablet (81 mg total) by mouth daily. (Patient not taking: Reported on 03/09/2017) 90 tablet 3 Not Taking  . labetalol (NORMODYNE) 200 MG tablet Take 1 tablet (200 mg total) by mouth 2 (two) times daily. 60 tablet 1 Taking  . Prenatal Vit-Fe Fumarate-FA (MULTIVITAMIN-PRENATAL) 27-0.8 MG TABS tablet Take 1 tablet by mouth daily at 12 noon.   Taking     Review of Systems   All systems reviewed and negative except as stated in HPI  Blood pressure (!) 146/104, pulse 89, temperature 98 F (36.7 C), temperature source Oral, resp. rate 16, last menstrual period 06/27/2016. General appearance: alert, cooperative and appears stated age, able to talk through contractions/does not appear in pain Lungs: no respiratory distress Heart:  regular rate, distal pulses 2+ Abdomen: soft, non-tender; bowel sounds normal Extremities: No calf swelling or tenderness Presentation: cephalic Fetal monitoring: category I, baseline 125, moderate variability, + accels, no decels Uterine activity: q3 min  Dilation: 3 Effacement (%): 70 Station: -2 Exam by:: Glenice Bow RN   Prenatal labs: ABO, Rh: O/Positive/-- (10/23 0000) Antibody: Negative (10/23 0000) Rubella: immune RPR: NON REAC (01/16 1025)  HBsAg: Negative (10/23 0000)  HIV: NONREACTIVE (01/16 1025)  GBS: Positive (03/09 0945)  2 hr Glucola: 76/161/114 (wnl) Genetic screening:  neg Anatomy US: wnl, female  Prenatal Transfer Tool  Maternal Diabetes: No Genetic Screening:  Normal Maternal Ultrasounds/Referrals: Normal Fetal Ultrasounds or other Referrals:  None Maternal Substance Abuse:  No Significant Maternal Medications:  Meds include: Other: Labetalol  Significant Maternal Lab Results: Lab values include: Group B Strep positive  Results for orders placed or performed during the hospital encounter of 03/15/17 (from the past 24 hour(s))  Urinalysis, Routine w reflex microscopic   Collection Time: 03/15/17  3:18 AM  Result Value Ref Range   Color, Urine YELLOW YELLOW   APPearance HAZY (A) CLEAR   Specific Gravity, Urine 1.009 1.005 - 1.030   pH 8.0 5.0 - 8.0   Glucose, UA NEGATIVE NEGATIVE mg/dL   Hgb urine dipstick MODERATE (A) NEGATIVE   Bilirubin Urine NEGATIVE NEGATIVE   Ketones, ur 5 (A) NEGATIVE mg/dL   Protein, ur NEGATIVE NEGATIVE mg/dL   Nitrite NEGATIVE NEGATIVE   Leukocytes, UA NEGATIVE NEGATIVE   RBC / HPF 6-30 0 - 5 RBC/hpf   WBC, UA 0-5 0 - 5 WBC/hpf   Bacteria, UA RARE (A) NONE SEEN   Squamous Epithelial / LPF 0-5 (A) NONE SEEN   Mucous PRESENT   Fern Test   Collection Time: 03/15/17  3:49 AM  Result Value Ref Range   POCT Fern Test Positive = ruptured amniotic membanes     Patient Active Problem List   Diagnosis Date Noted  . GBS (group B Streptococcus carrier), +RV culture, currently pregnant 03/05/2017  . Supervision of high-risk pregnancy 10/31/2016  . NASH (nonalcoholic steatohepatitis) 10/31/2016  . Asthma 08/01/2013  . Benign essential hypertension, antepartum 07/29/2013    Assessment: Shelly Adams is a 26 y.o. G2P1001 at 34w1dhere for spontaneous rupture of membranes  #Labor: likely in latent labor. Plan to observe & recheck in 4 hours.  If not progressing, plan to start pitocin at that time. #chronic hypertension:  BP elevated to 146/104 initially, now 130/90.  Patient reports not taking her labetalol today, however plan to check CBC, CMP, P:C to r/o SIPE. #GBS positive:  Penicillin 5/3 q4h #asthma:  Well  controlled off medications, no albuterol use in months. Monitor. #Pain: Epidural planned, may try IV fentanyl first. #FWB: Category I #ID:  none #MOF: breast #MOC:paraguard #Circ:  no  JLulu Riding3/21/2018, 4:03 AM    OB FELLOW HISTORY AND PHYSICAL ATTESTATION  I have seen and examined this patient; I agree with above documentation in the resident's note.   She is a 25showed G2 P1 at 38 and [redacted] weeks gestation presenting for spontaneous rupture of membranes. She reports contractions every 3-5 minutes. She is mildly uncomfortable she has had no call occasions of her pregnancy other than a positive GBS  Gen.: Well-appearing no acute distress Lungs: No respiratory distress Cardiovascular: Regular rate and intact distal pulses  Fetal heart tones reactive Contractions every 3 minutes  Assessment and plan: Admit patient for expectant management. The patient is not  progressing consider Pitocin augmentation: Penicillin for GBS.  Jacquiline Doe, MD 03/15/2017 [id number]

## 2017-03-15 NOTE — Anesthesia Pain Management Evaluation Note (Signed)
  CRNA Pain Management Visit Note  Patient: Shelly Adams, 26 y.o., female  "Hello I am a member of the anesthesia team at [facility]. We have an anesthesia team available at all times to provide care throughout the hospital, including epidural management and anesthesia for C-section. I don't know your plan for the delivery whether it a natural birth, water birth, IV sedation, nitrous supplementation, doula or epidural, but we want to meet your pain goals."   1.Was your pain managed to your expectations on prior hospitalizations?   Yes   2.What is your expectation for pain management during this hospitalization?     IV pain meds  3.How can we help you reach that goal? IV pain meds, will determine plan as labor progresses.  Patient is aware of options for pain control and has no questions.  Record the patient's initial score and the patient's pain goal.   Pain: 8, but patient states that even though her goal is 5/10, she is OK with this.  Pain Goal: 5 [facility] wants you to be able to say your pain was always managed very well.  Nicoles Sedlacek L 03/15/2017

## 2017-03-15 NOTE — Progress Notes (Signed)
Shelly Adams is a 26 y.o. G2P1001 at [redacted]w[redacted]d  Subjective: Patient comfortable for now. Can feel pressure with contractions. No new complaints.  Objective: BP (!) 142/78 Comment: with contraction  Pulse 84   Temp 98.3 F (36.8 C) (Oral)   Resp 20   Ht 5' 1"  (1.549 m)   Wt 166 lb (75.3 kg)   LMP 06/27/2016 (Approximate)   BMI 31.37 kg/m  No intake/output data recorded. No intake/output data recorded.  FHT:  FHR: 120 bpm, variability: moderate,  accelerations:  Present,  decelerations:  Absent UC:   regular, every 3 minutes SVE:   Dilation: 5.5 Effacement (%): 80 Station: -2 Exam by:: [employer]  Labs: Lab Results  Component Value Date   WBC 8.1 03/15/2017   HGB 11.0 (L) 03/15/2017   HCT 33.2 (L) 03/15/2017   MCV 85.1 03/15/2017   PLT 192 03/15/2017    Assessment / Plan: Spontaneous labor, progressing normally  Labor: Progressing normally Preeclampsia:  None Fetal Wellbeing:  Category I Pain Control:  IV pain meds I/D:  GBS positive on PCN Anticipated MOD:  NSVD Chronic hypertension: BP controlled 120s/80s, on labetalol  DRandolph Bing DO, PGY-1 03/15/2017, 9:49 AM

## 2017-03-15 NOTE — Progress Notes (Signed)
Chrysta Fulcher is a 26 y.o. G2P1001 at [redacted]w[redacted]d  Subjective: Patient says pain is well controlled since receiving epidural. Was initially concerned about worsening pain if started on pitocin but now agreeable.  Objective: BP 132/86   Pulse 84   Temp 98.6 F (37 C) (Oral)   Resp 18   Ht 5' 1"  (1.549 m)   Wt 166 lb (75.3 kg)   LMP 06/27/2016 (Approximate)   SpO2 99%   BMI 31.37 kg/m  No intake/output data recorded. No intake/output data recorded.  FHT:  FHR: 120 bpm, variability: moderate,  accelerations:  Present,  decelerations:  Absent UC:   regular, every 2 minutes SVE:   Dilation: 6 Effacement (%): 70 Station: -2 Exam by:: McMullen md  Labs: Lab Results  Component Value Date   WBC 12.5 (H) 03/15/2017   HGB 10.7 (L) 03/15/2017   HCT 32.4 (L) 03/15/2017   MCV 84.4 03/15/2017   PLT 179 03/15/2017    Assessment / Plan: Spontaneous labor, progressing normally. Will initiate pitocin and titrate to achieve adequate labor.  Labor: Progressing normally Preeclampsia:  None Fetal Wellbeing:  Category I Pain Control:  Epidural I/D:  GBS positive on PCN Anticipated MOD:  NSVD Chronic hypertension: BP controlled 120-130s/80s, on labetalol  DRandolph Bing DO, PGY-1 03/15/2017, 2:14 PM

## 2017-03-15 NOTE — Progress Notes (Signed)
Pt OOB to bathroom assisted by steady. Able to void 600 cc urine. Peri care demonstrated and pt able to perform by self.  Ice pack and pads applied. Pt back to bed with assist of steady.

## 2017-03-15 NOTE — MAU Note (Signed)
Pt presents stating she is having contractions and started leaking pink fluid today that started at 2300. Reports good fetal movement.

## 2017-03-15 NOTE — Anesthesia Procedure Notes (Signed)
Epidural Patient location during procedure: OB Start time: 03/15/2017 11:45 AM End time: 03/15/2017 12:00 PM  Staffing Anesthesiologist: Candida Peeling RAY Performed: anesthesiologist   Preanesthetic Checklist Completed: patient identified, site marked, surgical consent, pre-op evaluation, timeout performed, IV checked, risks and benefits discussed and monitors and equipment checked  Epidural Patient position: sitting Prep: DuraPrep Patient monitoring: heart rate, cardiac monitor, continuous pulse ox and blood pressure Approach: midline Location: L2-L3 Injection technique: LOR saline  Needle:  Needle type: Tuohy  Needle gauge: 17 G Needle length: 9 cm Needle insertion depth: 5 cm Catheter type: closed end flexible Catheter size: 20 Guage Catheter at skin depth: 8 cm Test dose: negative  Assessment Events: blood not aspirated, injection not painful, no injection resistance, negative IV test and no paresthesia  Additional Notes Reason for block:procedure for pain

## 2017-03-16 ENCOUNTER — Other Ambulatory Visit: Payer: Self-pay | Admitting: Obstetrics and Gynecology

## 2017-03-16 LAB — RPR: RPR: NONREACTIVE

## 2017-03-16 NOTE — Discharge Summary (Signed)
OB Discharge Summary     Patient Name: Shelly Adams DOB: [birth year] MRN: [id number]  Date of admission: 03/15/2017 Delivering MD: Naschitti Bing   Date of discharge: 03/17/2017  Admitting diagnosis: 60 WEEKS CTX LEAKING PINK FLUID Intrauterine pregnancy: [redacted]w[redacted]d    Secondary diagnosis:  Active Problems:   Normal labor  Additional problems: CHTN     Discharge diagnosis: Term Pregnancy Delivered                                                                                                Post partum procedures:none  Augmentation: none  Complications: None  Hospital course:  Onset of Labor With Vaginal Delivery     26y.o. yo [id number] 365w1das admitted in Latent Labor on 03/15/2017. Patient had an uncomplicated labor course as follows:  Membrane Rupture Time/Date: 11:00 PM ,03/14/2017   Intrapartum Procedures: Episiotomy: None [1]                                         Lacerations:  None [1]  Patient had a delivery of a Viable infant. 03/15/2017  Information for the patient's newborn:  HeAlianna, Wurster0[[id number]]Delivery Method: Vaginal, Spontaneous Delivery (Filed from Delivery Summary)    Pateint had an uncomplicated postpartum course.  Her BP meds were changed to Norvasc 52m81mostpartum. She is ambulating, tolerating a regular diet, passing flatus, and urinating well. Patient is discharged home in stable condition on 03/17/17.   Physical exam  Vitals:   03/16/17 1248 03/16/17 1810 03/16/17 1830 03/17/17 0700  BP: 117/67 (!) 145/84 137/78 124/76  Pulse: 85 82 81 82  Resp:  17  18  Temp:  98.1 F (36.7 C)  97.9 F (36.6 C)  TempSrc:  Oral  Oral  SpO2:      Weight:      Height:       General: alert, cooperative and no distress Lochia: appropriate Uterine Fundus: firm Incision: N/A DVT Evaluation: No evidence of DVT seen on physical exam. Negative Homan's sign. No significant calf/ankle edema. Labs: Lab Results  Component Value Date   WBC 12.5 (H)  03/15/2017   HGB 10.7 (L) 03/15/2017   HCT 32.4 (L) 03/15/2017   MCV 84.4 03/15/2017   PLT 179 03/15/2017   CMP Latest Ref Rng & Units 03/15/2017  Glucose 65 - 99 mg/dL 96  BUN 6 - 20 mg/dL 8  Creatinine 0.44 - 1.00 mg/dL 0.57  Sodium 135 - 145 mmol/L 134(L)  Potassium 3.5 - 5.1 mmol/L 4.1  Chloride 101 - 111 mmol/L 103  CO2 22 - 32 mmol/L 22  Calcium 8.9 - 10.3 mg/dL 8.6(L)  Total Protein 6.5 - 8.1 g/dL 6.4(L)  Total Bilirubin 0.3 - 1.2 mg/dL 0.6  Alkaline Phos 38 - 126 U/L 127(H)  AST 15 - 41 U/L 25  ALT 14 - 54 U/L 19    Discharge instruction: per After Visit Summary and "Baby and Me Booklet".  After visit meds:  Allergies as of 03/17/2017      Reactions   Tramadol Itching      Medication List    STOP taking these medications   aspirin EC 81 MG tablet   labetalol 200 MG tablet Commonly known as:  NORMODYNE     TAKE these medications   albuterol 108 (90 Base) MCG/ACT inhaler Commonly known as:  PROVENTIL HFA;VENTOLIN HFA Inhale 1 puff into the lungs every 6 (six) hours as needed for wheezing.   amLODipine 5 MG tablet Commonly known as:  NORVASC Take 1 tablet (5 mg total) by mouth daily.   multivitamin-prenatal 27-0.8 MG Tabs tablet Take 1 tablet by mouth daily at 12 noon.       Diet: routine diet  Activity: Advance as tolerated. Pelvic rest for 6 weeks.   Outpatient follow up: 1 week at [facility] for BP check.   Follow up Appt: Future Appointments Date Time Provider [place]  03/20/2017 2:30 PM Star Age, MD [facility] None   Follow up Visit:No Follow-up on file.  Postpartum contraception: IUD Paragard  Newborn Data: Live born female  Birth Weight: 7 lb 1.6 oz (3221 g) APGAR: 5, 8  Baby Feeding: Breast Disposition:home with mother   03/17/2017 Wyvonnia Dusky, CNM

## 2017-03-16 NOTE — Progress Notes (Signed)
Pt reports that she hasn't had any sensation in her bladder since delivery. She denies leaking, or incontinence, but says she has been going every 2-3 hours to try and ends up voiding. Anesthesia notified and told to call OB. Pt bladder scanned for 147, voided 150 and PVR for 48. Patient firm and midline. Only complains of cramping. OB resident on call notified. No new orders at this time. Encourage patient to empty frequently and continue to monitor for retention. Timoteo Ace, RN

## 2017-03-16 NOTE — Lactation Note (Signed)
This note was copied from a baby's chart. Lactation Consultation Note: Follow up visit with mom. She reports baby has been sleepy today.  Reviewed normal behavior the first 24 hours, feeding cues and cluster feeding. Encouraged to always breast feed first then give formula if baby is still hungry.  Mom has DEBP setup in room and reports she has pumped once but did not obtain any Colostrum. Reviewed importance of frequent nursing and pumping to promote a good milk supply.  Offered assist with latch and mom agreeable. Attempted to latch in football hold- reviewed basics.  Baby sleepy and would not latch Left skin to skin with mom.  No questions at present. To call for assist prn  Patient Name: Shelly Adams [id number] Date: 03/16/2017 Reason for consult: Follow-up assessment;Breast surgery   Maternal Data Formula Feeding for Exclusion: Yes Has patient been taught Hand Expression?: Yes Does the patient have breastfeeding experience prior to this delivery?: Yes  Feeding Feeding Type: Breast Fed  LATCH Score/Interventions Latch: Too sleepy or reluctant, no latch achieved, no sucking elicited.  Audible Swallowing: None  Type of Nipple: Everted at rest and after stimulation  Comfort (Breast/Nipple): Soft / non-tender     Hold (Positioning): Assistance needed to correctly position infant at breast and maintain latch. Intervention(s): Breastfeeding basics reviewed;Position options  LATCH Score: 5  Lactation Tools Discussed/Used     Consult Status Consult Status: Follow-up Date: 03/17/17 Follow-up type: In-patient    Truddie Crumble 03/16/2017, 2:51 PM

## 2017-03-16 NOTE — Discharge Instructions (Signed)
Postpartum Care After Vaginal Delivery The period of time right after you deliver your newborn is called the postpartum period. What kind of medical care will I receive?  You may continue to receive fluids and medicines through an IV tube inserted into one of your veins.  If an incision was made near your vagina (episiotomy) or if you had some vaginal tearing during delivery, cold compresses may be placed on your episiotomy or your tear. This helps to reduce pain and swelling.  You may be given a squirt bottle to use when you go to the bathroom. You may use this until you are comfortable wiping as usual. To use the squirt bottle, follow these steps:  Before you urinate, fill the squirt bottle with warm water. Do not use hot water.  After you urinate, while you are sitting on the toilet, use the squirt bottle to rinse the area around your urethra and vaginal opening. This rinses away any urine and blood.  You may do this instead of wiping. As you start healing, you may use the squirt bottle before wiping yourself. Make sure to wipe gently.  Fill the squirt bottle with clean water every time you use the bathroom.  You will be given sanitary pads to wear. How can I expect to feel?  You may not feel the need to urinate for several hours after delivery.  You will have some soreness and pain in your abdomen and vagina.  If you are breastfeeding, you may have uterine contractions every time you breastfeed for up to several weeks postpartum. Uterine contractions help your uterus return to its normal size.  It is normal to have vaginal bleeding (lochia) after delivery. The amount and appearance of lochia is often similar to a menstrual period in the first week after delivery. It will gradually decrease over the next few weeks to a dry, yellow-brown discharge. For most women, lochia stops completely by 6-8 weeks after delivery. Vaginal bleeding can vary from woman to woman.  Within the first few  days after delivery, you may have breast engorgement. This is when your breasts feel heavy, full, and uncomfortable. Your breasts may also throb and feel hard, tightly stretched, warm, and tender. After this occurs, you may have milk leaking from your breasts.Your health care provider can help you relieve discomfort due to breast engorgement. Breast engorgement should go away within a few days.  You may feel more sad or worried than normal due to hormonal changes after delivery. These feelings should not last more than a few days. If these feelings do not go away after several days, speak with your health care provider. How should I care for myself?  Tell your health care provider if you have pain or discomfort.  Drink enough water to keep your urine clear or pale yellow.  Wash your hands thoroughly with soap and water for at least 20 seconds after changing your sanitary pads, after using the toilet, and before holding or feeding your baby.  If you are not breastfeeding, avoid touching your breasts a lot. Doing this can make your breasts produce more milk.  If you become weak or lightheaded, or you feel like you might faint, ask for help before:  Getting out of bed.  Showering.  Change your sanitary pads frequently. Watch for any changes in your flow, such as a sudden increase in volume, a change in color, the passing of large blood clots. If you pass a blood clot from your vagina, save it  to show to your health care provider. Do not flush blood clots down the toilet without having your health care provider look at them.  Make sure that all your vaccinations are up to date. This can help protect you and your baby from getting certain diseases. You may need to have immunizations done before you leave the hospital.  If desired, talk with your health care provider about methods of family planning or birth control (contraception). How can I start bonding with my baby? Spending as much time as  possible with your baby is very important. During this time, you and your baby can get to know each other and develop a bond. Having your baby stay with you in your room (rooming in) can give you time to get to know your baby. Rooming in can also help you become comfortable caring for your baby. Breastfeeding can also help you bond with your baby. How can I plan for returning home with my baby?  Make sure that you have a car seat installed in your vehicle.  Your car seat should be checked by a certified car seat installer to make sure that it is installed safely.  Make sure that your baby fits into the car seat safely.  Ask your health care provider any questions you have about caring for yourself or your baby. Make sure that you are able to contact your health care provider with any questions after leaving the hospital. This information is not intended to replace advice given to you by your health care provider. Make sure you discuss any questions you have with your health care provider. Document Released: 10/09/2007 Document Revised: 05/16/2016 Document Reviewed: 11/16/2015 Elsevier Interactive Patient Education  2017 [employer].

## 2017-03-16 NOTE — Progress Notes (Signed)
Post Partum Day #1 Subjective: no complaints, up ad lib and tolerating PO; breastfeeding going slowly; plans on Paragard for contraception  Objective: Blood pressure 117/69, pulse 86, temperature 97.8 F (36.6 C), temperature source Oral, resp. rate 18, height 5' 1"  (1.549 m), weight 75.3 kg (166 lb), last menstrual period 06/27/2016, SpO2 99 %, unknown if currently breastfeeding.  Physical Exam:  General: alert, cooperative and no distress Lochia: appropriate Uterine Fundus: firm DVT Evaluation: No evidence of DVT seen on physical exam.   Recent Labs  03/15/17 0409 03/15/17 1022  HGB 11.0* 10.7*  HCT 33.2* 32.4*    Assessment/Plan: Plan for discharge tomorrow and Lactation consult  Starting Norvasc 5 today due to [place]   LOS: 1 day   Serita Grammes CNM 03/16/2017, 7:33 AM

## 2017-03-16 NOTE — Anesthesia Postprocedure Evaluation (Signed)
Anesthesia Post Note  Patient: Shelly Adams  Procedure(s) Performed: * No procedures listed *  Patient location during evaluation: Mother Baby Anesthesia Type: Epidural Level of consciousness: awake Pain management: pain level controlled Vital Signs Assessment: post-procedure vital signs reviewed and stable Respiratory status: spontaneous breathing Cardiovascular status: stable Postop Assessment: no headache, no backache, epidural receding and patient able to bend at knees Anesthetic complications: no        Last Vitals:  Vitals:   03/15/17 2330 03/16/17 0330  BP: 122/63 117/69  Pulse: (!) 125 86  Resp: 18 18  Temp: 37.3 C 36.6 C    Last Pain:  Vitals:   03/16/17 0330  TempSrc: Oral  PainSc: 2    Pain Goal:                 [name]

## 2017-03-16 NOTE — Lactation Note (Addendum)
This note was copied from a baby's chart. Lactation Consultation Note Mom's 3rd baby. Hasn't BF the other children. Mom had a radical breast reduction after the 2nd baby. She had the Breast reduction 2 yrs ago. Mom had breast changes during pregnancy as well as darkening of areola. LC unable to hand express colostrum.  Mom wants to supplement w/formula. States she is going to breast and formula feed. Mom states the baby has been at the breast but isn't getting anything. Pace feeding discussed. Supplementing feeding sheet given and reviewed. Mom has round breast very dark areola tiny nipple, no shaft. Breast is slightly compressible and baby sucks the nipple back into mouth, appears to be deep. Mom denies painful latch. Baby suckling on breast, good breast compressions noted. Mom has cramping as well. No swallows heard.  RN sat up DEBP for stimulation and lactation induction. Encouraged to pump every 3 hrs to stimulate breast d/t reduction.  Mom encouraged to feed baby 8-12 times/24 hours and with feeding cues. Mom encouraged to waken baby for feeds. Newborn behavior and feeding habits discussed, as well as STS, I&O, cluster feeding, supply and demand. [place] brochure given w/resources, support groups and [place] services. Patient Name: Shelly Adams [id number] Date: 03/16/2017 Reason for consult: Initial assessment   Maternal Data Has patient been taught Hand Expression?: Yes Does the patient have breastfeeding experience prior to this delivery?: No  Feeding Feeding Type: Breast Fed Length of feed: 7 min  LATCH Score/Interventions Latch: Repeated attempts needed to sustain latch, nipple held in mouth throughout feeding, stimulation needed to elicit sucking reflex. Intervention(s): Adjust position;Assist with latch;Breast massage;Breast compression  Audible Swallowing: None Intervention(s): Skin to skin;Hand expression  Type of Nipple: Flat Intervention(s): Double electric pump  Comfort  (Breast/Nipple): Soft / non-tender     Hold (Positioning): Assistance needed to correctly position infant at breast and maintain latch.  LATCH Score: 5  Lactation Tools Discussed/Used Tools: Pump Breast pump type: Double-Electric Breast Pump Pump Review: Setup, frequency, and cleaning;Milk Storage Initiated by:: Heide Guile, RN  Date initiated:: 03/16/17   Consult Status Consult Status: Follow-up Date: 03/16/17 Follow-up type: In-patient    Mead Slane, Elta Guadeloupe 03/16/2017, 5:01 AM

## 2017-03-17 MED ORDER — AMLODIPINE BESYLATE 5 MG PO TABS: 5.0000 mg | ORAL_TABLET | Freq: Every day | ORAL | 1 refills | Status: DC

## 2017-03-17 NOTE — Progress Notes (Signed)
Discharge education complete, discharge instructions and follow up appointment discussed. Patient verbalized understanding.

## 2017-03-17 NOTE — Lactation Note (Signed)
This note was copied from a baby's chart. Lactation Consultation Note  Mom is concerned because she is not able to express any milk. She reports that the baby latches and engages at the breast. Discussed benefits of syringe SNS with her and she was agreeable. Baby was cueing but fell asleep when placed at the breast. Mother to call for assistance at the next feeding. Patient Name: Shelly Adams [id number] Date: 03/17/2017 Reason for consult: Follow-up assessment   Maternal Data    Feeding Feeding Type: Breast Fed Nipple Type: Slow - flow Length of feed: 0 min  LATCH Score/Interventions                      Lactation Tools Discussed/Used     Consult Status      Van Clines 03/17/2017, 11:26 AM

## 2017-03-20 ENCOUNTER — Telehealth: Payer: Self-pay

## 2017-03-20 ENCOUNTER — Ambulatory Visit: Payer: Self-pay | Admitting: Neurology

## 2017-03-20 NOTE — Telephone Encounter (Signed)
Patient called and cancelled appt same day.

## 2017-03-21 ENCOUNTER — Inpatient Hospital Stay (HOSPITAL_COMMUNITY): Admission: RE | Admit: 2017-03-21 | Payer: Medicaid Other | Source: Ambulatory Visit

## 2017-04-05 ENCOUNTER — Encounter: Payer: Self-pay | Admitting: Advanced Practice Midwife

## 2017-04-12 ENCOUNTER — Telehealth: Payer: Self-pay | Admitting: Family Medicine

## 2017-04-12 ENCOUNTER — Ambulatory Visit: Payer: Self-pay | Admitting: Advanced Practice Midwife

## 2017-04-12 NOTE — Telephone Encounter (Signed)
Called patient due to no-show for postpartum appointment. Patient informed me that she did not need appointment and was fine. Informed patient of importance and that I would notate chart.

## 2017-05-08 ENCOUNTER — Emergency Department (HOSPITAL_COMMUNITY)
Admission: EM | Admit: 2017-05-08 | Discharge: 2017-05-08 | Disposition: A | Discharge status: Home / Self Care | Payer: Medicaid Other | Attending: Emergency Medicine | Admitting: Emergency Medicine

## 2017-05-08 ENCOUNTER — Encounter (HOSPITAL_COMMUNITY): Payer: Self-pay

## 2017-05-08 DIAGNOSIS — R000 Tachycardia, unspecified: Secondary | ICD-10-CM | POA: Insufficient documentation

## 2017-05-08 DIAGNOSIS — I10 Essential (primary) hypertension: Secondary | ICD-10-CM | POA: Diagnosis present

## 2017-05-08 DIAGNOSIS — R42 Dizziness and giddiness: Secondary | ICD-10-CM | POA: Diagnosis not present

## 2017-05-08 DIAGNOSIS — Z5321 Procedure and treatment not carried out due to patient leaving prior to being seen by health care provider: Secondary | ICD-10-CM | POA: Insufficient documentation

## 2017-05-08 DIAGNOSIS — Z79899 Other long term (current) drug therapy: Secondary | ICD-10-CM | POA: Diagnosis not present

## 2017-05-08 NOTE — ED Notes (Signed)
No answer from lobby

## 2017-05-08 NOTE — ED Triage Notes (Signed)
States for a couple of day high blood pressure with dizziness and fast heart rate at times, alert and oriented x 3 nsr in triage.

## 2017-08-17 ENCOUNTER — Ambulatory Visit (INDEPENDENT_AMBULATORY_CARE_PROVIDER_SITE_OTHER): Payer: Medicaid Other | Admitting: Internal Medicine

## 2017-08-17 ENCOUNTER — Encounter: Payer: Self-pay | Admitting: Internal Medicine

## 2017-08-17 VITALS — BP 108/64 | HR 70 | Ht 61.0 in | Wt 165.0 lb

## 2017-08-17 DIAGNOSIS — R1013 Epigastric pain: Secondary | ICD-10-CM | POA: Diagnosis not present

## 2017-08-17 NOTE — Patient Instructions (Signed)
   Your physician has requested that you go to the basement for the following lab work before leaving today: Stool study   I appreciate the opportunity to care for you. Silvano Rusk, MD, [facility]

## 2017-08-17 NOTE — Progress Notes (Signed)
Shelly Adams 26 y.o. 01-Feb-1991 [id number]  Assessment & Plan:   Encounter Diagnoses  Name Primary?  . Dyspepsia Yes  . Abdominal pain, epigastric     H pylori stool agWill be tested. I think she could have H. pylori gastritis causing her dyspepsia. If that is negative consider something like FD gard versus retrial of PPI. Possibly H2 blocker. 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 Endoscopy will be considered but with her age and the symptoms etc. may not be needed.  I appreciate the opportunity to care for this patient. CC: Bartholome Bill, MD  Subjective:   Chief Complaint:Epigastric pain after meals and nausea occasional  HPI  the patient is a pleasant 26 year old Hispanic woman about 5 months postpartum with her second child, who I know from a visit last summer when she was having intense epigastric pain radiating to the infrascapular area. I thought that might of been related to the gallbladder i.e. gallstones and an ultrasound was ordered which showed increased echodensity of the liver suggestive of steatosis but no gallstones or other pathology. Those symptoms seem to abate, we were going to do a HIDA scan but she became pregnant and she felt better. Now for the past few months she's had symptoms of fullness and some nausea after eating more so fullness and pressure sensation in the epigastrium. There is no vomiting Bowel habits are regular without difficulty. She thinks foods like avocado and french fries bother her more than others at this point. She has not tried any over-the-counter medications that I am aware of.  Allergies  Allergen Reactions  . Tramadol Itching   Current Meds  Medication Sig  . Prenatal Vit-Fe Fumarate-FA (MULTIVITAMIN-PRENATAL) 27-0.8 MG TABS tablet Take 1 tablet by mouth daily at 12 noon.   Past Medical History:  Diagnosis Date  .  Anxiety   . Benign essential hypertension, antepartum 07/29/2013   On propranolol-->labetalol [x]  Aspirin 81 mg daily after 12 weeks; discontinue after 36 weeks Current antihypertensives:  Labetalol   Baseline and surveillance labs (pulled in from [facility], refresh links as needed)  Lab Results Component Value Date  PLT 198 10/17/2016  CREATININE 0.82 07/05/2016  AST 20 07/05/2016  ALT 25 07/05/2016  PROTCRRATIO 1.11 (H) 08/29/2013  PROTEIN24HR 231 (H) 08/23/2013 Plt 0.52 Pr:Cr 108  Antenatal Testing CHTN - O10.919  Group I  BP < 140/90, no preeclampsia, AGA,  nml AFV, +/- meds    Group II BP > 140/90, on meds, no preeclampsia, AGA, nml AFV  20-28-34-38  20-24-28-32-35-38  32//2 x wk  28//BPP wkly then 32//2 x wk  40 no meds; 39 meds  PRN or 37 Pre-eclampsia  GHTN - O13.9/Preeclampsia without severe features  - O14.00   Preeclampsia with severe features - O14.10  Q 3-4wks  Q 2 wks  28//BPP wkly then 32//2 x wk  Inpatient  37  PRN or 34    . Fatty liver disease, nonalcoholic   . 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 GBS (group B Streptococcus carrier), +RV culture, currently pregnant 03/05/2017  . Headache   . Hyperlipemia   . Insomnia   . Normal delivery 2014   And 2018  . Recurrent kidney stones   . Steatosis of liver suggested by ultrasound 10/31/2016   Past Surgical History:  Procedure Laterality Date  . BREAST REDUCTION SURGERY  2009   Social History   Social History  .  Marital status: Single    Spouse name: N/A  . Number of children: 1  . Years of education: College    Occupational History  . Pharmacy Tech     Social History Main Topics  . Smoking status: Never Smoker  . Smokeless tobacco: Never Used  . Alcohol use No     Comment: occasionally   . Drug use: No  . Sexual activity: Yes    Birth control/ protection: None    Social History Narrative   Rarely drinks caffeine beverages    family history includes GER disease in her mother; Hyperlipidemia in her father and mother; Hypertension  in her father and mother.   Review of Systems As above  Objective:   Physical Exam BP 108/64   Pulse 70   Ht 5' 1"  (1.549 m)   Wt 165 lb (74.8 kg)   BMI 31.18 kg/m    Well-appearing overweight young Hispanic woman in no acute distress Eyes are anicteric the abdomen is soft bowel sounds present mild epigastric tenderness without mass or hepatosplenomegaly

## 2017-10-03 ENCOUNTER — Telehealth: Payer: Self-pay | Admitting: Internal Medicine

## 2017-10-03 NOTE — Telephone Encounter (Signed)
Patient missed a flight today due to epigastric pain.  She is asking for a note for the airlines saying you are treating her for abdominal pain and it is disabling at times to get a refund on her ticket.  She never turned in the stool antigen, she said she will do that today or tomorrow.

## 2017-10-03 NOTE — Telephone Encounter (Signed)
Left message for patient to call back  

## 2017-10-04 ENCOUNTER — Encounter: Payer: Self-pay | Admitting: Internal Medicine

## 2017-10-04 NOTE — Telephone Encounter (Signed)
The pt has been advised and the letter left at the front desk for pick up

## 2017-10-04 NOTE — Telephone Encounter (Signed)
Left message on machine to call back letter printed.

## 2017-10-04 NOTE — Telephone Encounter (Signed)
Letter sent to you

## 2017-11-21 ENCOUNTER — Encounter (HOSPITAL_COMMUNITY): Payer: Self-pay | Admitting: Family Medicine

## 2017-11-21 DIAGNOSIS — K819 Cholecystitis, unspecified: Secondary | ICD-10-CM | POA: Insufficient documentation

## 2017-11-21 DIAGNOSIS — R110 Nausea: Secondary | ICD-10-CM | POA: Insufficient documentation

## 2017-11-21 DIAGNOSIS — I10 Essential (primary) hypertension: Secondary | ICD-10-CM | POA: Diagnosis not present

## 2017-11-21 DIAGNOSIS — R1011 Right upper quadrant pain: Secondary | ICD-10-CM | POA: Diagnosis present

## 2017-11-21 NOTE — ED Triage Notes (Signed)
Patient is complaining of left upper quad pain that has been intermittent for one year. Pain described as pressure. Other associated symptoms are nausea, distention, and bloating. She reports the pain occurs mostly after eating fatty meals but today it occurred with out eating a fatty meal. Patient denies taking any OTC medication to help with symptoms but has took [place] in the past with mild relief.

## 2017-11-22 ENCOUNTER — Emergency Department (HOSPITAL_COMMUNITY): Payer: Medicaid Other

## 2017-11-22 ENCOUNTER — Emergency Department (HOSPITAL_COMMUNITY)
Admission: EM | Admit: 2017-11-22 | Discharge: 2017-11-22 | Disposition: A | Discharge status: Home / Self Care | Payer: Medicaid Other | Attending: Emergency Medicine | Admitting: Emergency Medicine

## 2017-11-22 DIAGNOSIS — R110 Nausea: Secondary | ICD-10-CM

## 2017-11-22 DIAGNOSIS — R1011 Right upper quadrant pain: Secondary | ICD-10-CM

## 2017-11-22 DIAGNOSIS — K819 Cholecystitis, unspecified: Secondary | ICD-10-CM

## 2017-11-22 LAB — CBC
HEMATOCRIT: 40.8 % (ref 36.0–46.0)
Hemoglobin: 14.5 g/dL (ref 12.0–15.0)
MCH: 31.7 pg (ref 26.0–34.0)
MCHC: 35.5 g/dL (ref 30.0–36.0)
MCV: 89.3 fL (ref 78.0–100.0)
Platelets: 234 10*3/uL (ref 150–400)
RBC: 4.57 MIL/uL (ref 3.87–5.11)
RDW: 12.4 % (ref 11.5–15.5)
WBC: 9 10*3/uL (ref 4.0–10.5)

## 2017-11-22 LAB — COMPREHENSIVE METABOLIC PANEL
ALT: 80 U/L — ABNORMAL HIGH (ref 14–54)
ANION GAP: 8 (ref 5–15)
AST: 62 U/L — ABNORMAL HIGH (ref 15–41)
Albumin: 4.8 g/dL (ref 3.5–5.0)
Alkaline Phosphatase: 88 U/L (ref 38–126)
BUN: 15 mg/dL (ref 6–20)
CHLORIDE: 105 mmol/L (ref 101–111)
CO2: 23 mmol/L (ref 22–32)
Calcium: 9.5 mg/dL (ref 8.9–10.3)
Creatinine, Ser: 0.76 mg/dL (ref 0.44–1.00)
Glucose, Bld: 90 mg/dL (ref 65–99)
Potassium: 3.5 mmol/L (ref 3.5–5.1)
Sodium: 136 mmol/L (ref 135–145)
Total Bilirubin: 0.6 mg/dL (ref 0.3–1.2)
Total Protein: 8.2 g/dL — ABNORMAL HIGH (ref 6.5–8.1)

## 2017-11-22 LAB — URINALYSIS, ROUTINE W REFLEX MICROSCOPIC
BACTERIA UA: NONE SEEN
BILIRUBIN URINE: NEGATIVE
Glucose, UA: NEGATIVE mg/dL
KETONES UR: 20 mg/dL — AB
LEUKOCYTES UA: NEGATIVE
Nitrite: NEGATIVE
PH: 6 (ref 5.0–8.0)
Protein, ur: NEGATIVE mg/dL
Specific Gravity, Urine: 1.014 (ref 1.005–1.030)

## 2017-11-22 LAB — LIPASE, BLOOD: LIPASE: 29 U/L (ref 11–51)

## 2017-11-22 LAB — I-STAT BETA HCG BLOOD, ED (MC, WL, AP ONLY)

## 2017-11-22 MED ORDER — SODIUM CHLORIDE 0.9 % IV BOLUS (SEPSIS)
1000.0000 mL | INTRAVENOUS | Status: AC
  Administered 2017-11-22: 1000 mL via INTRAVENOUS

## 2017-11-22 NOTE — Discharge Instructions (Signed)
1. Medications: usual home medications 2. Treatment: rest, drink plenty of fluids, advance diet slowly 3. Follow Up: Please followup with your primary doctor in 2 days for discussion of your diagnoses and further evaluation after today's visit; if you do not have a primary care doctor use the resource guide provided to find one; Please return to the ER for persistent vomiting, high fevers or worsening symptoms

## 2017-11-22 NOTE — ED Provider Notes (Signed)
Patient seen/examined in the Emergency Department in conjunction with Midlevel Provider  Patient reports RUQ pain. US imaging confirms cholecystitis Exam : awake/alert, no distress Plan: patient prefers to go home and will f/u closely with general surgery   Ripley Fraise, MD 11/22/17 [phone]

## 2017-11-22 NOTE — ED Provider Notes (Signed)
[facility] DEPT Provider Note   CSN: [id number] Arrival date & time: 11/21/17  2305     History   Chief Complaint Chief Complaint  Patient presents with  . Abdominal Pain    HPI Shelly Adams is a 26 y.o. female with a hx of HTN, Nonalcoholic fatty liver disease, kidney stones presents to the Emergency Department complaining of gradual, persistent, progressively worsening RUQ abd pain onset tonight after dinner. Pt reports she also has crampy pain in the right back.  Pt reports epigastric pain is normal after a fatty meal, but she denies having pain like tonight. She reports the discomfort has been intermittent and was previously better when she was a vegan.  Associated symptoms include abdominal bloating and nausea.  Nothing makes the pain better.  Pt denies fever, chills, headache, neck pain, chest pain, SOB, vomiting, diarrhea, weakness, dizziness, syncope, dysuria, hematuria, vaginal discharge. Pt reports she is followed by GI for a fatty liver disease, but has not had a follow-up in several months.  She reports recent international travel to [place] returning 2 weeks ago.     The history is provided by the patient and medical records. No language interpreter was used.    Past Medical History:  Diagnosis Date  . Anxiety   . Benign essential hypertension, antepartum 07/29/2013   On propranolol-->labetalol [x]  Aspirin 81 mg daily after 12 weeks; discontinue after 36 weeks Current antihypertensives:  Labetalol   Baseline and surveillance labs (pulled in from [facility], refresh links as needed)  Lab Results Component Value Date  PLT 198 10/17/2016  CREATININE 0.82 07/05/2016  AST 20 07/05/2016  ALT 25 07/05/2016  PROTCRRATIO 1.11 (H) 08/29/2013  PROTEIN24HR 231 (H) 08/23/2013 Plt 0.52 Pr:Cr 108  Antenatal Testing CHTN - O10.919  Group I  BP < 140/90, no preeclampsia, AGA,  nml AFV, +/- meds    Group II BP > 140/90, on meds, no preeclampsia, AGA, nml AFV   20-28-34-38  20-24-28-32-35-38  32//2 x wk  28//BPP wkly then 32//2 x wk  40 no meds; 39 meds  PRN or 37 Pre-eclampsia  GHTN - O13.9/Preeclampsia without severe features  - O14.00   Preeclampsia with severe features - O14.10  Q 3-4wks  Q 2 wks  28//BPP wkly then 32//2 x wk  Inpatient  37  PRN or 34    . Fatty liver disease, nonalcoholic   . 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 GBS (group B Streptococcus carrier), +RV culture, currently pregnant 03/05/2017  . Headache   . Hyperlipemia   . Insomnia   . Normal delivery 2014   And 2018  . Recurrent kidney stones   . Steatosis of liver suggested by ultrasound 10/31/2016    Patient Active Problem List   Diagnosis Date Noted  . Steatosis of liver suggested by ultrasound 10/31/2016  . Asthma 08/01/2013    Past Surgical History:  Procedure Laterality Date  . BREAST REDUCTION SURGERY  2009    OB History    Gravida Para Term Preterm AB Living   2 2 2     2    SAB TAB Ectopic Multiple Live Births         0 2       Home Medications    Prior to Admission medications   Medication Sig Start Date End Date Taking? Authorizing Provider  clonazePAM (KLONOPIN) 1 MG tablet Take 1 mg by mouth as needed for anxiety.  11/20/17  Yes [provider]  Prenatal Vit-Fe Fumarate-FA (PRENATAL  MULTIVITAMIN) TABS tablet Take 1 tablet by mouth daily at 12 noon.   Yes [provider]    Family History Family History  Problem Relation Age of Onset  . Hypertension Mother   . Hyperlipidemia Mother   . GER disease Mother   . Hyperlipidemia Father   . Hypertension Father   . Colon cancer Neg Hx     Social History Social History   Tobacco Use  . Smoking status: Never Smoker  . Smokeless tobacco: Never Used  Substance Use Topics  . Alcohol use: No  . Drug use: No     Allergies   Tramadol   Review of Systems Review of Systems  Constitutional: Negative for appetite change, diaphoresis, fatigue, fever and unexpected weight change.    HENT: Negative for mouth sores.   Eyes: Negative for visual disturbance.  Respiratory: Negative for cough, chest tightness, shortness of breath and wheezing.   Cardiovascular: Negative for chest pain.  Gastrointestinal: Positive for abdominal pain. Negative for constipation, diarrhea, nausea and vomiting.  Endocrine: Negative for polydipsia, polyphagia and polyuria.  Genitourinary: Negative for dysuria, frequency, hematuria and urgency.  Musculoskeletal: Positive for back pain. Negative for neck stiffness.  Skin: Negative for rash.  Allergic/Immunologic: Negative for immunocompromised state.  Neurological: Negative for syncope, light-headedness and headaches.  Hematological: Does not bruise/bleed easily.  Psychiatric/Behavioral: Negative for sleep disturbance. The patient is not nervous/anxious.      Physical Exam Updated Vital Signs BP (!) 144/106 (BP Location: Right Arm)   Pulse 78   Temp 98.2 F (36.8 C) (Oral)   Resp 20   Ht 5' (1.524 m)   Wt 74.8 kg (165 lb)   LMP 11/18/2017   SpO2 100%   BMI 32.22 kg/m   Physical Exam  Constitutional: She appears well-developed and well-nourished. No distress.  Awake, alert, nontoxic appearance  HENT:  Head: Normocephalic and atraumatic.  Mouth/Throat: Oropharynx is clear and moist. No oropharyngeal exudate.  Eyes: Conjunctivae are normal. No scleral icterus.  Neck: Normal range of motion. Neck supple.  Cardiovascular: Normal rate, regular rhythm and intact distal pulses.  Pulmonary/Chest: Effort normal and breath sounds normal. No respiratory distress. She has no wheezes.  Equal chest expansion  Abdominal: Soft. Bowel sounds are normal. She exhibits no mass. There is no hepatomegaly. There is tenderness in the right upper quadrant. There is no rigidity, no rebound, no guarding, no tenderness at McBurney's point and negative Murphy's sign.  Musculoskeletal: Normal range of motion. She exhibits no edema.  Neurological: She is alert.   Speech is clear and goal oriented Moves extremities without ataxia  Skin: Skin is warm and dry. She is not diaphoretic.  Psychiatric: She has a normal mood and affect.  Nursing note and vitals reviewed.    ED Treatments / Results  Labs (all labs ordered are listed, but only abnormal results are displayed) Labs Reviewed  COMPREHENSIVE METABOLIC PANEL - Abnormal; Notable for the following components:      Result Value   Total Protein 8.2 (*)    AST 62 (*)    ALT 80 (*)    All other components within normal limits  URINALYSIS, ROUTINE W REFLEX MICROSCOPIC - Abnormal; Notable for the following components:   Hgb urine dipstick SMALL (*)    Ketones, ur 20 (*)    Squamous Epithelial / LPF 0-5 (*)    All other components within normal limits  LIPASE, BLOOD  CBC  I-STAT BETA HCG BLOOD, ED ([facility], [facility], [facility] ONLY)  Radiology US Abdomen Complete  Result Date: 11/22/2017 CLINICAL DATA:  Right upper quadrant pain EXAM: ABDOMEN ULTRASOUND COMPLETE COMPARISON:  CT 06/21/2010 FINDINGS: Gallbladder: Multiple small stones measuring up to 4.5 mm. Thickened slightly edematous appearing wall measuring 4.2 mm. Negative sonographic Murphy. Common bile duct: Diameter: Up to 6 mm maximum Liver: Increased echogenicity. Portal vein is patent on color Doppler imaging with normal direction of blood flow towards the liver. IVC: No abnormality visualized. Pancreas: Visualized portion unremarkable. Spleen: Size and appearance within normal limits. Right Kidney: Length: 11.5. Echogenicity within normal limits. No mass or hydronephrosis visualized. Left Kidney: Length: 12.4. Echogenicity within normal limits. No mass or hydronephrosis visualized. Abdominal aorta: No aneurysm visualized. Other findings: None. IMPRESSION: 1. Multiple small stones within the gallbladder with thickened edematous wall, raising concern for cholecystitis despite negative sonographic Murphy. No biliary dilatation. 2. Increased hepatic  echogenicity, likely due to fatty infiltration. Electronically Signed   By: Donavan Foil M.D.   On: 11/22/2017 02:09    Procedures Procedures (including critical care time)  Medications Ordered in ED Medications  sodium chloride 0.9 % bolus 1,000 mL (1,000 mLs Intravenous New Bag/Given 11/22/17 0221)     Initial Impression / Assessment and Plan / ED Course  I have reviewed the triage vital signs and the nursing notes.  Pertinent labs & imaging results that were available during my care of the patient were reviewed by me and considered in my medical decision making (see chart for details).  Clinical Course as of Nov 22 328  Wed Nov 22, 2017  0302 Discussed with Dr. Rosendo Gros who will help arrange outpatient follow-up.  [HM]    Clinical Course User Index [HM] Amaru Burroughs, Gwenlyn Perking    Presents with right upper quadrant abdominal pain.  Labs show the elevated transaminase with AST and ALT 62 and 80 respectively.  Ultrasound shows evidence of cholecystitis without sonographic Murphy sign.  Repeat exam abdomen is soft without significant tenderness.  No guarding or rebound.  No Murphy sign.  Long discussion with patient about findings.  She has a child care concerns and does not want to be admitted today.  She is afebrile and nontoxic-appearing.  Discussed this with Dr. Rosendo Gros who will help arrange outpatient follow-up.  Strict return precautions given including immediate return for fevers, persistent vomiting or worsening pain.  Patient states understanding and is in agreement with this plan.  The patient was discussed with and seen by Dr. Christy Gentles who agrees with the treatment plan.   Final Clinical Impressions(s) / ED Diagnoses   Final diagnoses:  Cholecystitis  RUQ abdominal pain  Nausea    ED Discharge Orders    None       Agapito Games 11/22/17 0329    Ripley Fraise, MD 11/22/17 [phone]

## 2017-11-22 NOTE — ED Notes (Signed)
Bed: WA21 Expected date:  Expected time:  Means of arrival:  Comments: Held: Lenice Llamas

## 2017-12-26 NOTE — L&D Delivery Note (Signed)
Patient is 27 y.o. [id number] IOL for CHTN superimposed w PreE with severe features of uncontrolled HA.S/p IOL with cytotec, followed by Pitocin. SROM at 2210.  Prenatal course also complicated by History of pre-eclampsia in previous pregnancy, chronic hypertension (on no controlling medication), gestational diabetes (diet controlled), NASH (by ultrasound), Asthma (has albuterol inhaler at home, hasn't needed in years).   .  Delivery Note At [phone] viable female was delivered via Vaginal, Spontaneous (Presentation: ROA  ).  APGAR: 7, 9; weight pending  .   Placenta status: spontaneous, intact .  Cord: 3vessel  with the following complications: none  Head delivered ROA. No nuchal cord present. Shoulder and body delivered in usual fashion. Infant with spontaneous cry, placed on mother's abdomen, dried and bulb suctioned. Cord clamped x 2 after 1-minute delay, and cut by RN. Placenta delivered spontaneously with gentle cord traction. Small remaining placental membrane removed via forceps. Fundus firm with massage and Pitocin. Perineum inspected and found to have sulcal laceration, which was found to be hemostatic and which was repaired with good hemostasis achieved.  Anesthesia:  Epidural  Episiotomy:  none Lacerations: 1st degree;Sulcus Suture Repair: 2.0 vicryl Est. Blood Loss (mL): 416 cc  Mom to postpartum.  Baby to Couplet care / Skin to Skin.  SCaroline More DO PGY-2 09/27/2018, 5:54 AM

## 2018-01-01 ENCOUNTER — Ambulatory Visit: Payer: Self-pay | Admitting: Surgery

## 2018-01-05 IMAGING — US US ABDOMEN COMPLETE
1 series · 14 of 25 positions shown · non-contrast
Comparison: CT 06/21/2010

CLINICAL DATA: Right upper quadrant pain

EXAM:
ABDOMEN ULTRASOUND COMPLETE

[Series 1: us abdomen complete · 0.22mm/px · 14 of 103 slices shown]
[im 1/103]
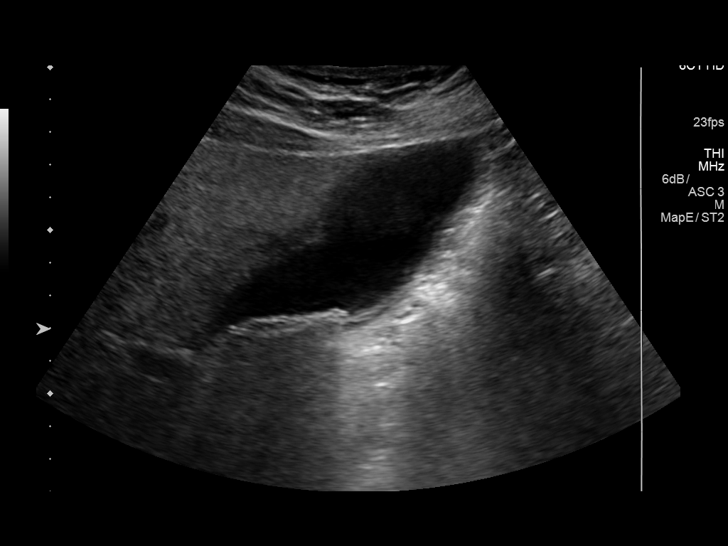
[im 9/103]
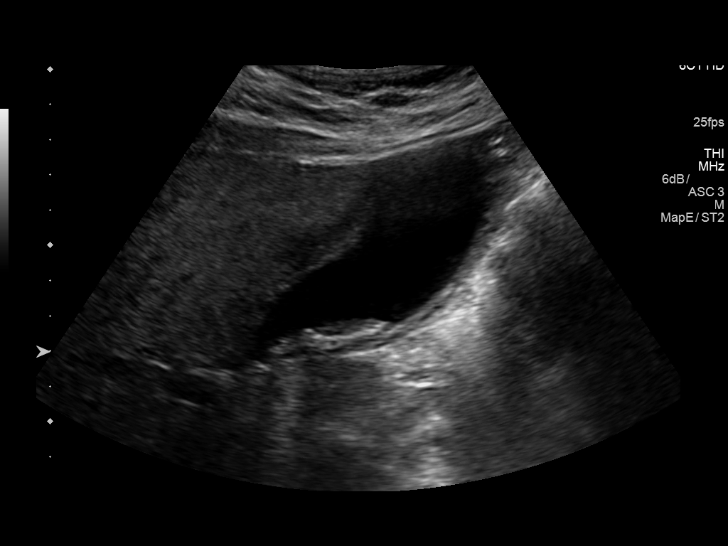
[im 18/103]
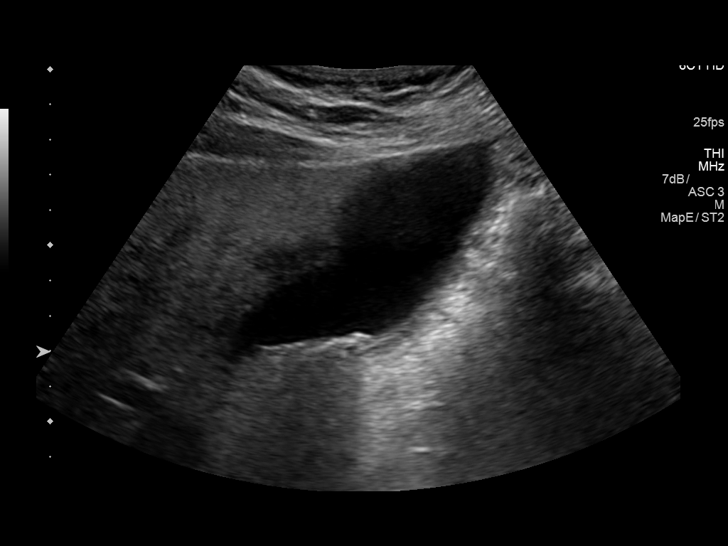
[im 26/103]
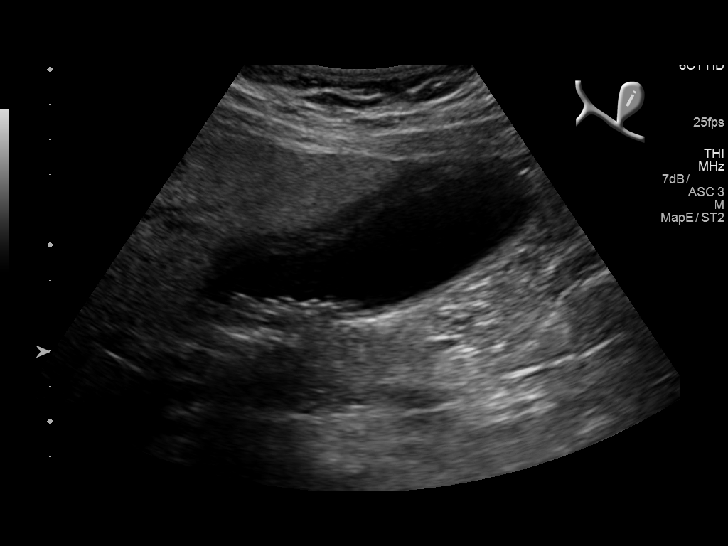
[im 35/103]
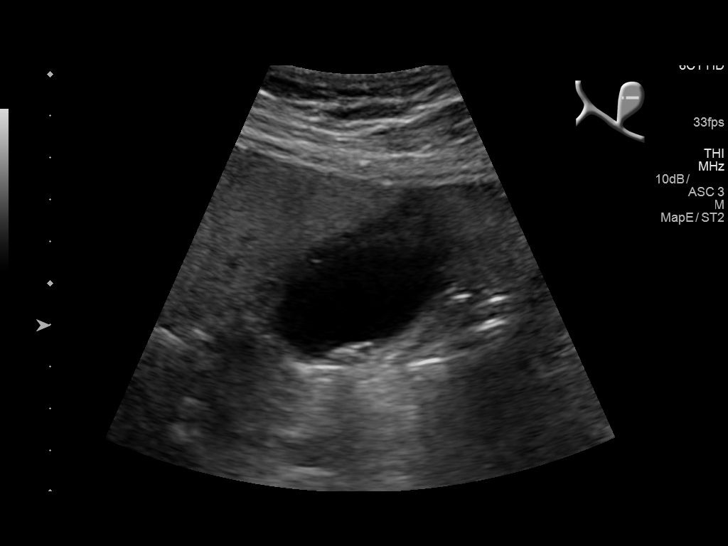
[im 39/103]
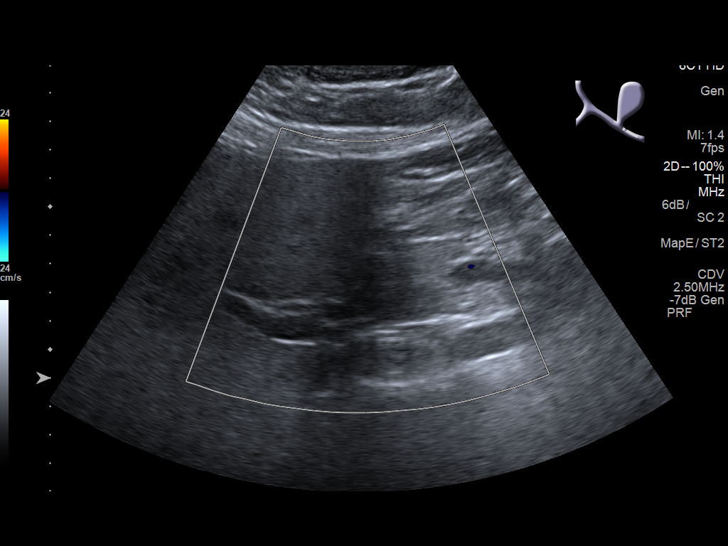
[im 47/103]
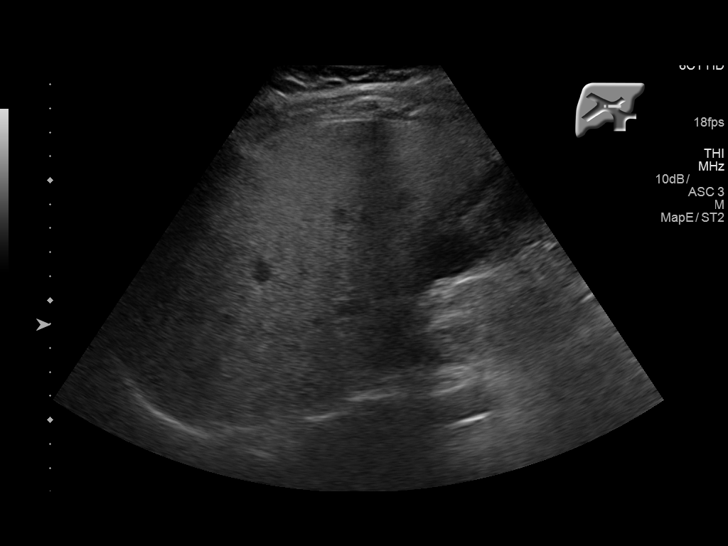
[im 56/103]
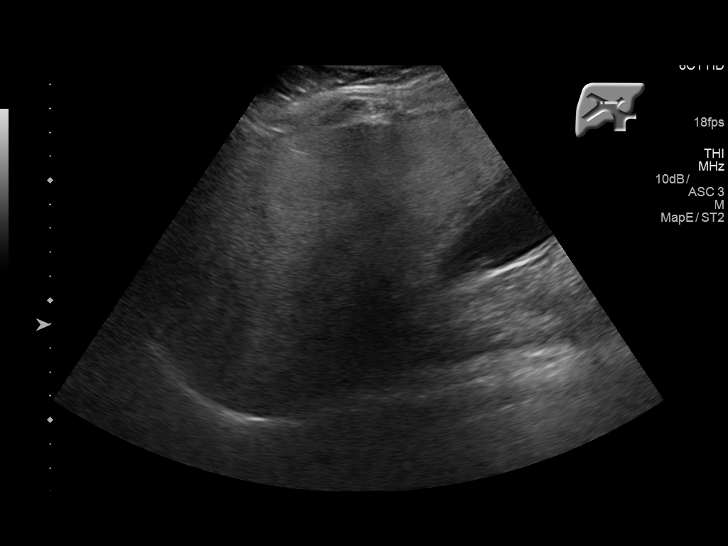
[im 64/103]
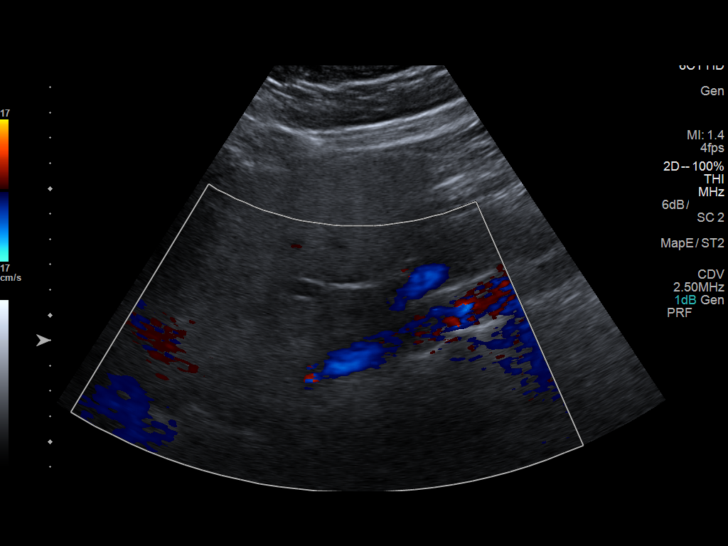
[im 69/103]
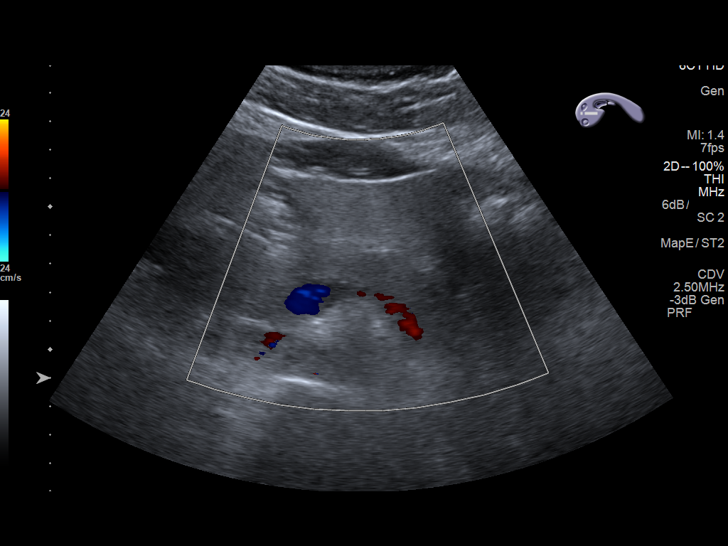
[im 77/103]
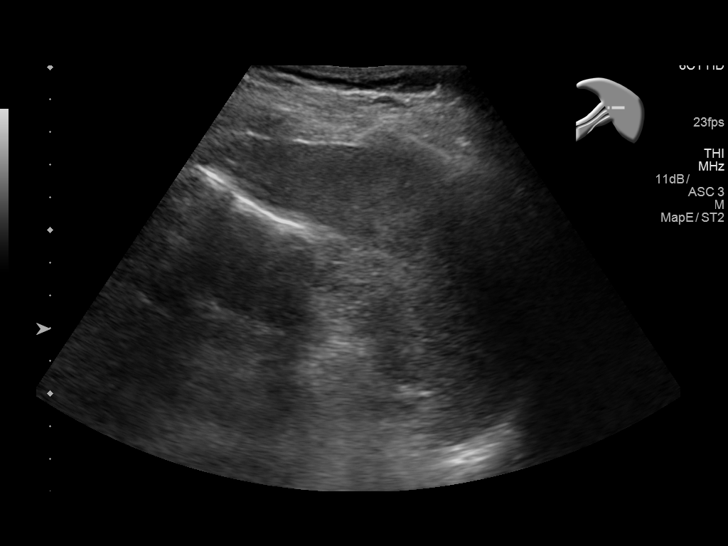
[im 86/103]
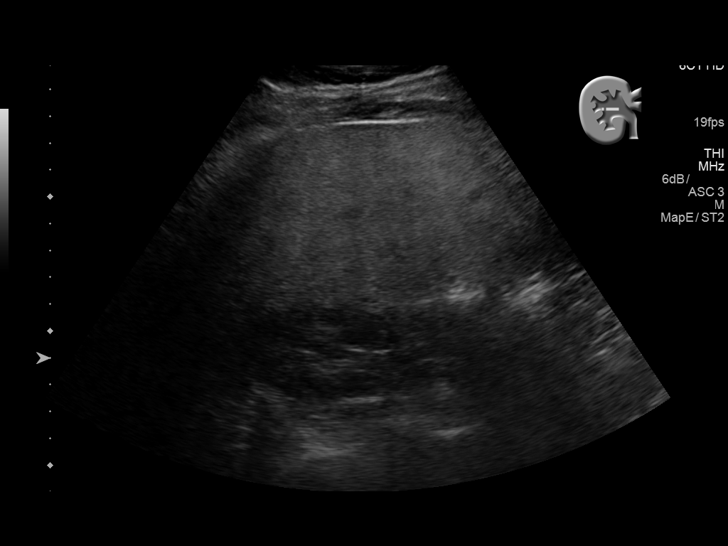
[im 94/103]
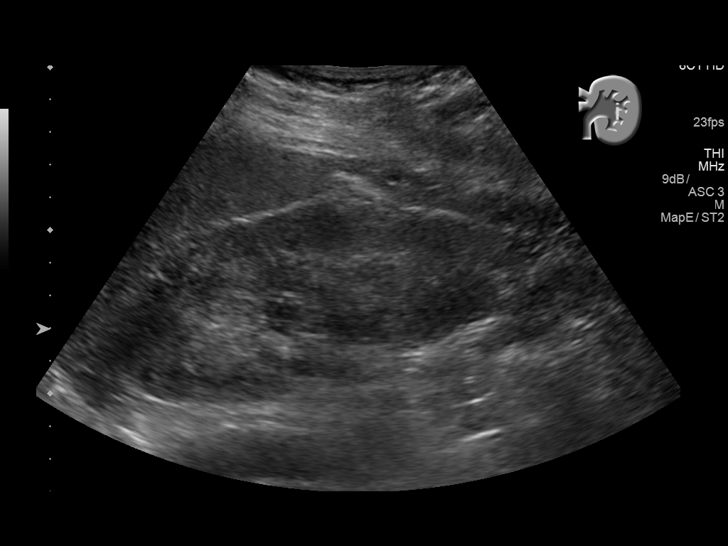
[im 103/103]
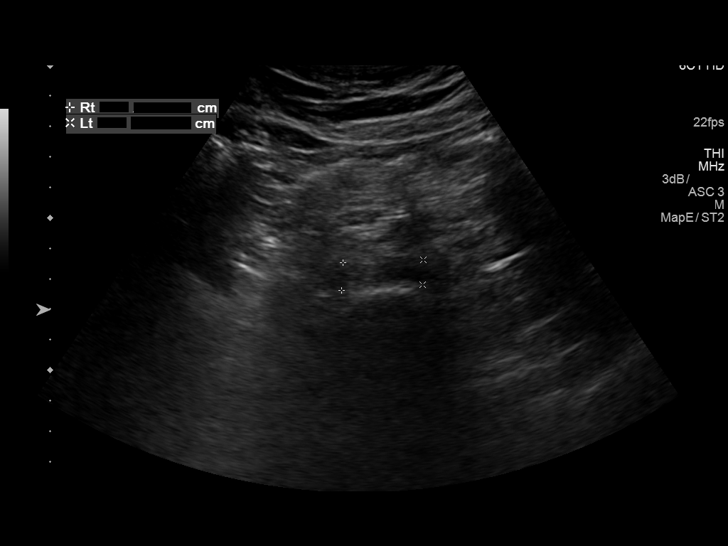

[14 of 25 positions shown; findings below may reference images not displayed]

FINDINGS: Gallbladder: Multiple small stones measuring up to 4.5 mm. Thickened
slightly edematous appearing wall measuring 4.2 mm. Negative
sonographic Murphy.

Common bile duct: Diameter: Up to 6 mm maximum

Liver: Increased echogenicity. Portal vein is patent on color
Doppler imaging with normal direction of blood flow towards the
liver.

IVC: No abnormality visualized.

Pancreas: Visualized portion unremarkable.

Spleen: Size and appearance within normal limits.

Right Kidney: Length: 11.5. Echogenicity within normal limits. No
mass or hydronephrosis visualized.

Left Kidney: Length: 12.4. Echogenicity within normal limits. No
mass or hydronephrosis visualized.

Abdominal aorta: No aneurysm visualized.

Other findings: None.
IMPRESSION: 1. Multiple small stones within the gallbladder with thickened
edematous wall, raising concern for cholecystitis despite negative
sonographic Murphy. No biliary dilatation.
2. Increased hepatic echogenicity, likely due to fatty infiltration.

## 2018-02-19 ENCOUNTER — Ambulatory Visit: Payer: Self-pay

## 2018-02-27 ENCOUNTER — Ambulatory Visit: Payer: Medicaid Other | Admitting: *Deleted

## 2018-02-27 ENCOUNTER — Encounter: Payer: Self-pay | Admitting: *Deleted

## 2018-02-27 DIAGNOSIS — Z3201 Encounter for pregnancy test, result positive: Secondary | ICD-10-CM

## 2018-02-27 DIAGNOSIS — Z3200 Encounter for pregnancy test, result unknown: Secondary | ICD-10-CM

## 2018-02-27 LAB — POCT PREGNANCY, URINE: PREG TEST UR: POSITIVE — AB

## 2018-02-27 NOTE — Progress Notes (Signed)
Pt came in the office for pregnancy test--9 wks 5 days pt having symptoms nausea and mild cramping but not really painful. Pt decide to prenatal care here in the office and suggested keep taking prenatal vitamins. Pt denied taking B/P medication,

## 2018-02-28 NOTE — Progress Notes (Signed)
I agree with the nurses note and plan of care. The patient does not have pain at this time. She was instructed to seek care in the [facility] if pain or bleeding developed.    Noni Saupe I, NP 02/28/2018 2:24 PM

## 2018-03-19 ENCOUNTER — Ambulatory Visit (INDEPENDENT_AMBULATORY_CARE_PROVIDER_SITE_OTHER): Payer: Medicaid Other | Admitting: Student

## 2018-03-19 ENCOUNTER — Other Ambulatory Visit (HOSPITAL_COMMUNITY)
Admission: RE | Admit: 2018-03-19 | Discharge: 2018-03-19 | Disposition: A | Discharge status: Home / Self Care | Payer: Medicaid Other | Source: Ambulatory Visit | Attending: Student | Admitting: Student

## 2018-03-19 ENCOUNTER — Encounter: Payer: Self-pay | Admitting: Student

## 2018-03-19 VITALS — BP 119/76 | HR 82 | Wt 154.2 lb

## 2018-03-19 DIAGNOSIS — O10911 Unspecified pre-existing hypertension complicating pregnancy, first trimester: Secondary | ICD-10-CM | POA: Diagnosis present

## 2018-03-19 DIAGNOSIS — O0991 Supervision of high risk pregnancy, unspecified, first trimester: Secondary | ICD-10-CM | POA: Diagnosis not present

## 2018-03-19 DIAGNOSIS — O10913 Unspecified pre-existing hypertension complicating pregnancy, third trimester: Secondary | ICD-10-CM

## 2018-03-19 DIAGNOSIS — O0990 Supervision of high risk pregnancy, unspecified, unspecified trimester: Secondary | ICD-10-CM | POA: Insufficient documentation

## 2018-03-19 HISTORY — DX: Unspecified pre-existing hypertension complicating pregnancy, third trimester: O10.913

## 2018-03-19 LAB — POCT URINALYSIS DIP (DEVICE)
Bilirubin Urine: NEGATIVE
Glucose, UA: NEGATIVE mg/dL
Hgb urine dipstick: NEGATIVE
Ketones, ur: 15 mg/dL — AB
Nitrite: NEGATIVE
Protein, ur: NEGATIVE mg/dL
Specific Gravity, Urine: 1.025 (ref 1.005–1.030)
Urobilinogen, UA: 0.2 mg/dL (ref 0.0–1.0)
pH: 6.5 (ref 5.0–8.0)

## 2018-03-19 NOTE — Progress Notes (Signed)
Gave OB pack. Signed up for baby script app. Already has MyChart

## 2018-03-19 NOTE — Progress Notes (Signed)
Subjective:   Shelly Adams is a 27 y.o. G3P2002 at 23w4dby LMP being seen today for her first obstetrical visit.  Her obstetrical history is significant for pregnancy induced hypertension and gestational diabetes. Patient does intend to breast feed. Pregnancy history fully reviewed. She has chronic hypertension. Her PCP has recently discontinued her antihypertensive medications to determine if they are still needed. Reports gestational diabetes & preeclampsia with her first pregnancy.   Patient reports nausea. Reports daily nausea. Has been using pressure wrist bands with some relief. Declines prescription for antiemetic.   HISTORY: OB History  Gravida Para Term Preterm AB Living  3 2 2  0 0 2  SAB TAB Ectopic Multiple Live Births  0 0 0 0 2    # Outcome Date GA Lbr Len/2nd Weight Sex Delivery Anes PTL Lv  3 Current           2 Term 03/15/17 317w1d7:05 / 01:08 7 lb 1.6 oz (3.221 kg) M Vag-Spont EPI  LIV     Name: HEMARILU, RYLANDER   Apgar1: 5  Apgar5: 8  1 Term 09/01/13 3733w0d:01 / 02:10 5 lb 11.4 oz (2.591 kg) F Vag-Vacuum EPI  LIV     Complications: Preeclampsia, GDM (gestational diabetes mellitus)     Name: HERNIYANNA, ASCH  Apgar1: 6  [place]    Last pap smear was done 09/2016 and was normal  Past Medical History:  Diagnosis Date  . Anxiety   . Benign essential hypertension, antepartum 07/29/2013   On propranolol-->labetalol [x]  Aspirin 81 mg daily after 12 weeks; discontinue after 36 weeks Current antihypertensives:  Labetalol   Baseline and surveillance labs [facility])  Lab Results Component Value Date  PLT 198 10/17/2016  CREATININE 0.82 07/05/2016  AST 20 07/05/2016  ALT 25 07/05/2016  PROTCRRATIO 1.11 (H) 08/29/2013  PROTEIN24HR 231 (H) 08/23/2013 Plt 0.52 Pr:Cr 108  Antenatal Testing CHTN - O10.919  Group I  BP < 140/90, no preeclampsia, AGA,  nml AFV, +/- meds    Group II BP > 140/90, on meds, no preeclampsia, AGA, nml AFV   20-28-34-38  20-24-28-32-35-38  32//2 x wk  27//BPP wkly then 32//2 x wk  39 no meds; 39 meds  PRN or 37 Pre-eclampsia  GHTN - O13.9/Preeclampsia without severe features  - O14.00   Preeclampsia with severe features - O14.10  Q 3-4wks  Q 2 wks  27//BPP wkly then 31//2 x wk  Inpatient  37  PRN or 34    . Fatty liver disease, nonalcoholic   . 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 GBS (group B Streptococcus carrier), +RV culture, currently pregnant 03/05/2017  . Headache   . Hyperlipemia   . Insomnia   . Normal delivery 2014   And 2018  . Recurrent kidney stones    history of kidney stones  . Steatosis of liver suggested by ultrasound 10/31/2016   Past Surgical History:  Procedure Laterality Date  . BREAST REDUCTION SURGERY  2009   Family History  Problem Relation Age of Onset  . Hypertension Mother   . Hyperlipidemia Mother   . GER disease Mother   . Hyperlipidemia Father   . Hypertension Father   . Colon cancer Neg Hx    Social History   Tobacco Use  . Smoking status: Never Smoker  . Smokeless tobacco: Never Used  Substance Use Topics  . Alcohol use: No  . Drug use: No  Allergies  Allergen Reactions  . Tramadol Itching   Current Outpatient Medications on File Prior to Visit  Medication Sig Dispense Refill  . Prenatal Vit-Fe Fumarate-FA (PRENATAL MULTIVITAMIN) TABS tablet Take 1 tablet by mouth daily at 12 noon.     No current facility-administered medications on file prior to visit.     Review of Systems Pertinent items noted in HPI and remainder of comprehensive ROS otherwise negative.  Exam   Vitals:   03/19/18 1626  BP: 119/76  Pulse: 82  Weight: 154 lb 3.2 oz (69.9 kg)   Fetal Heart Rate (bpm): 172  Uterus:     System: General: well-developed, well-nourished female in no acute distress   Breast:  normal appearance, no masses or tenderness   Skin: normal coloration and turgor, no rashes   Neurologic: oriented, normal, negative, normal mood   Extremities: normal  strength, tone, and muscle mass, ROM of all joints is normal   HEENT PERRLA, extraocular movement intact and sclera clear, anicteric   Mouth/Teeth mucous membranes moist, pharynx normal without lesions and dental hygiene good   Neck supple and no masses   Cardiovascular: regular rate and rhythm   Respiratory:  no respiratory distress, normal breath sounds   Abdomen: soft, non-tender; bowel sounds normal; no masses,  no organomegaly     Assessment:   Pregnancy: [id number] Patient Active Problem List   Diagnosis Date Noted  . Supervision of high risk pregnancy, antepartum 03/19/2018  . Maternal chronic hypertension in first trimester 03/19/2018  . Steatosis of liver suggested by ultrasound 10/31/2016  . Asthma 08/01/2013     Plan:  1. Supervision of high risk pregnancy, antepartum  - Comp Met (CMET) - Protein / creatinine ratio, urine - HgB A1c - [facility] BABYSCRIPTS OPT IN  2. Maternal chronic hypertension in first trimester -Will start baby ASA daily. States she will get OTC. - Culture, OB Urine - Obstetric Panel, Including HIV - SMN1 COPY NUMBER ANALYSIS (SMA Carrier Screen) - GC/Chlamydia probe amp ([place])not at [facility] - [facility] BABYSCRIPTS OPT IN   Initial labs drawn. Continue prenatal vitamins. Genetic Screening discussed, First trimester screen, Quad screen and NIPS: undecided. States she will discuss options with the FOB & make decision at next visit.  Ultrasound discussed; fetal anatomic survey: will schedule at next visit. Problem list reviewed and updated. The nature of [facility] with multiple MDs and other Advanced Practice Providers was explained to patient; also emphasized that residents, students are part of our team. Routine obstetric precautions reviewed. Return in about 1 month (around 04/16/2018) for High Risk OB.   [facility] for [facility]

## 2018-03-19 NOTE — Patient Instructions (Addendum)
Hypertension During Pregnancy Hypertension, commonly called high blood pressure, is when the force of blood pumping through your arteries is too strong. Arteries are blood vessels that carry blood from the heart throughout the body. Hypertension during pregnancy can cause problems for you and your baby. Your baby may be born early (prematurely) or may not weigh as much as he or she should at birth. Very bad cases of hypertension during pregnancy can be life-threatening. Different types of hypertension can occur during pregnancy. These include:  Chronic hypertension. This happens when: ? You have hypertension before pregnancy and it continues during pregnancy. ? You develop hypertension before you are [redacted] weeks pregnant, and it continues during pregnancy.  Gestational hypertension. This is hypertension that develops after the 20th week of pregnancy.  Preeclampsia, also called toxemia of pregnancy. This is a very serious type of hypertension that develops only during pregnancy. It affects the whole body, and it can be very dangerous for you and your baby.  Gestational hypertension and preeclampsia usually go away within 6 weeks after your baby is born. Women who have hypertension during pregnancy have a greater chance of developing hypertension later in life or during future pregnancies. What are the causes? The exact cause of hypertension is not known. What increases the risk? There are certain factors that make it more likely for you to develop hypertension during pregnancy. These include:  Having hypertension during a previous pregnancy or prior to pregnancy.  Being overweight.  Being older than age 107.  Being pregnant for the first time or being pregnant with more than one baby.  Becoming pregnant using fertilization methods such as IVF (in vitro fertilization).  Having diabetes, kidney problems, or systemic lupus erythematosus.  Having a family history of hypertension.  What are the  signs or symptoms? Chronic hypertension and gestational hypertension rarely cause symptoms. Preeclampsia causes symptoms, which may include:  Increased protein in your urine. Your health care provider will check for this at every visit before you give birth (prenatal visit).  Severe headaches.  Sudden weight gain.  Swelling of the hands, face, legs, and feet.  Nausea and vomiting.  Vision problems, such as blurred or double vision.  Numbness in the face, arms, legs, and feet.  Dizziness.  Slurred speech.  Sensitivity to bright lights.  Abdominal pain.  Convulsions.  How is this diagnosed? You may be diagnosed with hypertension during a routine prenatal exam. At each prenatal visit, you may:  Have a urine test to check for high amounts of protein in your urine.  Have your blood pressure checked. A blood pressure reading is recorded as two numbers, such as "120 over 80" (or 120/80). The first ("top") number is called the systolic pressure. It is a measure of the pressure in your arteries when your heart beats. The second ("bottom") number is called the diastolic pressure. It is a measure of the pressure in your arteries as your heart relaxes between beats. Blood pressure is measured in a unit called mm Hg. A normal blood pressure reading is: ? Systolic: below 235. ? Diastolic: below 80.  The type of hypertension that you are diagnosed with depends on your test results and when your symptoms developed.  Chronic hypertension is usually diagnosed before 20 weeks of pregnancy.  Gestational hypertension is usually diagnosed after 20 weeks of pregnancy.  Hypertension with high amounts of protein in the urine is diagnosed as preeclampsia.  Blood pressure measurements that stay above 573 systolic, or above 220 diastolic, are  signs of severe preeclampsia.  How is this treated? Treatment for hypertension during pregnancy varies depending on the type of hypertension you have and how  serious it is.  If you take medicines called ACE inhibitors to treat chronic hypertension, you may need to switch medicines. ACE inhibitors should not be taken during pregnancy.  If you have gestational hypertension, you may need to take blood pressure medicine.  If you are at risk for preeclampsia, your health care provider may recommend that you take a low-dose aspirin every day to prevent high blood pressure during your pregnancy.  If you have severe preeclampsia, you may need to be hospitalized so you and your baby can be monitored closely. You may also need to take medicine (magnesium sulfate) to prevent seizures and to lower blood pressure. This medicine may be given as an injection or through an IV tube.  In some cases, if your condition gets worse, you may need to deliver your baby early.  Follow these instructions at home: Eating and drinking  Drink enough fluid to keep your urine clear or pale yellow.  Eat a healthy diet that is low in salt (sodium). Do not add salt to your food. Check food labels to see how much sodium a food or beverage contains. Lifestyle  Do not use any products that contain nicotine or tobacco, such as cigarettes and e-cigarettes. If you need help quitting, ask your health care provider.  Do not use alcohol.  Avoid caffeine.  Avoid stress as much as possible. Rest and get plenty of sleep. General instructions  Take over-the-counter and prescription medicines only as told by your health care provider.  While lying down, lie on your left side. This keeps pressure off your baby.  While sitting or lying down, raise (elevate) your feet. Try putting some pillows under your lower legs.  Exercise regularly. Ask your health care provider what kinds of exercise are best for you.  Keep all prenatal and follow-up visits as told by your health care provider. This is important. Contact a health care provider if:  You have symptoms that your health care  provider told you may require more treatment or monitoring, such as: ? Fever. ? Vomiting. ? Headache. Get help right away if:  You have severe abdominal pain or vomiting that does not get better with treatment.  You suddenly develop swelling in your hands, ankles, or face.  You gain 4 lbs (1.8 kg) or more in 1 week.  You develop vaginal bleeding, or you have blood in your urine.  You do not feel your baby moving as much as usual.  You have blurred or double vision.  You have muscle twitching or sudden tightening (spasms).  You have shortness of breath.  Your lips or fingernails turn blue. This information is not intended to replace advice given to you by your health care provider. Make sure you discuss any questions you have with your health care provider. Document Released: 08/30/2011 Document Revised: 07/01/2016 Document Reviewed: 05/27/2016 Elsevier Interactive Patient Education  2018 [employer].        Contraception Choices Contraception, also called birth control, refers to methods or devices that prevent pregnancy. Hormonal methods Contraceptive implant A contraceptive implant is a thin, plastic tube that contains a hormone. It is inserted into the upper part of the arm. It can remain in place for up to 3 years. Progestin-only injections Progestin-only injections are injections of progestin, a synthetic form of the hormone progesterone. They are given every  3 months by a health care provider. Birth control pills Birth control pills are pills that contain hormones that prevent pregnancy. They must be taken once a day, preferably at the same time each day. Birth control patch The birth control patch contains hormones that prevent pregnancy. It is placed on the skin and must be changed once a week for three weeks and removed on the fourth week. A prescription is needed to use this method of contraception. Vaginal ring A vaginal ring contains hormones that prevent  pregnancy. It is placed in the vagina for three weeks and removed on the fourth week. After that, the process is repeated with a new ring. A prescription is needed to use this method of contraception. Emergency contraceptive Emergency contraceptives prevent pregnancy after unprotected sex. They come in pill form and can be taken up to 5 days after sex. They work best the sooner they are taken after having sex. Most emergency contraceptives are available without a prescription. This method should not be used as your only form of birth control. Barrier methods Female condom A female condom is a thin sheath that is worn over the penis during sex. Condoms keep sperm from going inside a woman's body. They can be used with a spermicide to increase their effectiveness. They should be disposed after a single use. Female condom A female condom is a soft, loose-fitting sheath that is put into the vagina before sex. The condom keeps sperm from going inside a woman's body. They should be disposed after a single use. Diaphragm A diaphragm is a soft, dome-shaped barrier. It is inserted into the vagina before sex, along with a spermicide. The diaphragm blocks sperm from entering the uterus, and the spermicide kills sperm. A diaphragm should be left in the vagina for 6-8 hours after sex and removed within 24 hours. A diaphragm is prescribed and fitted by a health care provider. A diaphragm should be replaced every 1-2 years, after giving birth, after gaining more than 15 lb (6.8 kg), and after pelvic surgery. Cervical cap A cervical cap is a round, soft latex or plastic cup that fits over the cervix. It is inserted into the vagina before sex, along with spermicide. It blocks sperm from entering the uterus. The cap should be left in place for 6-8 hours after sex and removed within 48 hours. A cervical cap must be prescribed and fitted by a health care provider. It should be replaced every 2 years. Sponge A sponge is a  soft, circular piece of polyurethane foam with spermicide on it. The sponge helps block sperm from entering the uterus, and the spermicide kills sperm. To use it, you make it wet and then insert it into the vagina. It should be inserted before sex, left in for at least 6 hours after sex, and removed and thrown away within 30 hours. Spermicides Spermicides are chemicals that kill or block sperm from entering the cervix and uterus. They can come as a cream, jelly, suppository, foam, or tablet. A spermicide should be inserted into the vagina with an applicator at least 24-09 minutes before sex to allow time for it to work. The process must be repeated every time you have sex. Spermicides do not require a prescription. Intrauterine contraception Intrauterine device (IUD) An IUD is a T-shaped device that is put in a woman's uterus. There are two types:  Hormone IUD.This type contains progestin, a synthetic form of the hormone progesterone. This type can stay in place for 3-5 years.  Copper IUD.This type is wrapped in copper wire. It can stay in place for 10 years.  Permanent methods of contraception Female tubal ligation In this method, a woman's fallopian tubes are sealed, tied, or blocked during surgery to prevent eggs from traveling to the uterus. Hysteroscopic sterilization In this method, a small, flexible insert is placed into each fallopian tube. The inserts cause scar tissue to form in the fallopian tubes and block them, so sperm cannot reach an egg. The procedure takes about 3 months to be effective. Another form of birth control must be used during those 3 months. Female sterilization This is a procedure to tie off the tubes that carry sperm (vasectomy). After the procedure, the man can still ejaculate fluid (semen). Natural planning methods Natural family planning In this method, a couple does not have sex on days when the woman could become pregnant. Calendar method This means keeping  track of the length of each menstrual cycle, identifying the days when pregnancy can happen, and not having sex on those days. Ovulation method In this method, a couple avoids sex during ovulation. Symptothermal method This method involves not having sex during ovulation. The woman typically checks for ovulation by watching changes in her temperature and in the consistency of cervical mucus. Post-ovulation method In this method, a couple waits to have sex until after ovulation. Summary  Contraception, also called birth control, means methods or devices that prevent pregnancy.  Hormonal methods of contraception include implants, injections, pills, patches, vaginal rings, and emergency contraceptives.  Barrier methods of contraception can include female condoms, female condoms, diaphragms, cervical caps, sponges, and spermicides.  There are two types of IUDs (intrauterine devices). An IUD can be put in a woman's uterus to prevent pregnancy for 3-5 years.  Permanent sterilization can be done through a procedure for males, females, or both.  Natural family planning methods involve not having sex on days when the woman could become pregnant. This information is not intended to replace advice given to you by your health care provider. Make sure you discuss any questions you have with your health care provider. Document Released: 12/12/2005 Document Revised: 01/14/2017 Document Reviewed: 01/14/2017 Elsevier Interactive Patient Education  2018 [employer].

## 2018-03-19 NOTE — Progress Notes (Signed)
pmh completed

## 2018-03-21 LAB — GC/CHLAMYDIA PROBE AMP (~~LOC~~) NOT AT ARMC
CHLAMYDIA, DNA PROBE: NEGATIVE
NEISSERIA GONORRHEA: NEGATIVE

## 2018-03-21 LAB — CULTURE, OB URINE

## 2018-03-21 LAB — URINE CULTURE, OB REFLEX: ORGANISM ID, BACTERIA: NO GROWTH

## 2018-03-26 LAB — COMPREHENSIVE METABOLIC PANEL
ALBUMIN: 4.4 g/dL (ref 3.5–5.5)
ALK PHOS: 61 IU/L (ref 39–117)
ALT: 18 IU/L (ref 0–32)
AST: 19 IU/L (ref 0–40)
Albumin/Globulin Ratio: 1.6 (ref 1.2–2.2)
BUN / CREAT RATIO: 5 — AB (ref 9–23)
BUN: 3 mg/dL — AB (ref 6–20)
Bilirubin Total: 0.2 mg/dL (ref 0.0–1.2)
CO2: 20 mmol/L (ref 20–29)
CREATININE: 0.55 mg/dL — AB (ref 0.57–1.00)
Calcium: 9.2 mg/dL (ref 8.7–10.2)
Chloride: 101 mmol/L (ref 96–106)
GFR calc non Af Amer: 129 mL/min/{1.73_m2} (ref >59)
GFR, EST AFRICAN AMERICAN: 149 mL/min/{1.73_m2} (ref >59)
GLUCOSE: 84 mg/dL (ref 65–99)
Globulin, Total: 2.7 g/dL (ref 1.5–4.5)
Potassium: 3.8 mmol/L (ref 3.5–5.2)
Sodium: 138 mmol/L (ref 134–144)
TOTAL PROTEIN: 7.1 g/dL (ref 6.0–8.5)

## 2018-03-26 LAB — SMN1 COPY NUMBER ANALYSIS (SMA CARRIER SCREENING)

## 2018-03-26 LAB — OBSTETRIC PANEL, INCLUDING HIV
ANTIBODY SCREEN: NEGATIVE
BASOS: 1 %
Basophils Absolute: 0 10*3/uL (ref 0.0–0.2)
EOS (ABSOLUTE): 0.1 10*3/uL (ref 0.0–0.4)
EOS: 2 %
HEMOGLOBIN: 12.9 g/dL (ref 11.1–15.9)
HIV Screen 4th Generation wRfx: NONREACTIVE
Hematocrit: 39 % (ref 34.0–46.6)
Hepatitis B Surface Ag: NEGATIVE
IMMATURE GRANS (ABS): 0 10*3/uL (ref 0.0–0.1)
Immature Granulocytes: 0 %
LYMPHS: 23 %
Lymphocytes Absolute: 1.8 10*3/uL (ref 0.7–3.1)
MCH: 31.3 pg (ref 26.6–33.0)
MCHC: 33.1 g/dL (ref 31.5–35.7)
MCV: 95 fL (ref 79–97)
MONOCYTES: 6 %
Monocytes Absolute: 0.5 10*3/uL (ref 0.1–0.9)
Neutrophils Absolute: 5.4 10*3/uL (ref 1.4–7.0)
Neutrophils: 68 %
Platelets: 216 10*3/uL (ref 150–379)
RBC: 4.12 x10E6/uL (ref 3.77–5.28)
RDW: 14.3 % (ref 12.3–15.4)
RH TYPE: POSITIVE
RPR: NONREACTIVE
RUBELLA: 21.3 {index} (ref >0.99)
WBC: 7.9 10*3/uL (ref 3.4–10.8)

## 2018-03-26 LAB — HEMOGLOBIN A1C
ESTIMATED AVERAGE GLUCOSE: 100 mg/dL
Hgb A1c MFr Bld: 5.1 % (ref 4.8–5.6)

## 2018-03-26 LAB — PROTEIN / CREATININE RATIO, URINE

## 2018-03-28 ENCOUNTER — Encounter: Payer: Self-pay | Admitting: *Deleted

## 2018-04-23 ENCOUNTER — Encounter: Payer: Self-pay | Admitting: Obstetrics and Gynecology

## 2018-04-30 ENCOUNTER — Ambulatory Visit (INDEPENDENT_AMBULATORY_CARE_PROVIDER_SITE_OTHER): Payer: Medicaid Other | Admitting: Family Medicine

## 2018-04-30 VITALS — BP 123/80 | HR 95 | Wt 150.4 lb

## 2018-04-30 DIAGNOSIS — O0990 Supervision of high risk pregnancy, unspecified, unspecified trimester: Secondary | ICD-10-CM

## 2018-04-30 DIAGNOSIS — O09299 Supervision of pregnancy with other poor reproductive or obstetric history, unspecified trimester: Secondary | ICD-10-CM

## 2018-04-30 DIAGNOSIS — O10911 Unspecified pre-existing hypertension complicating pregnancy, first trimester: Secondary | ICD-10-CM

## 2018-04-30 DIAGNOSIS — Z8632 Personal history of gestational diabetes: Secondary | ICD-10-CM

## 2018-04-30 NOTE — Progress Notes (Signed)
   PRENATAL VISIT NOTE  Subjective:  Shelly Adams is a 27 y.o. G3P2002 at 31w4dbeing seen today for ongoing prenatal care.  She is currently monitored for the following issues for this high-risk pregnancy and has Asthma; Steatosis of liver suggested by ultrasound; Supervision of high risk pregnancy, antepartum; and Maternal chronic hypertension in first trimester on their problem list.  Patient reports no complaints.  Contractions: Irritability. Vag. Bleeding: None.  Movement: Present. Denies leaking of fluid.   The following portions of the patient's history were reviewed and updated as appropriate: allergies, current medications, past family history, past medical history, past social history, past surgical history and problem list. Problem list updated.  Objective:   Vitals:   04/30/18 1552  BP: 123/80  Pulse: 95  Weight: 150 lb 6.4 oz (68.2 kg)    Fetal Status: Fetal Heart Rate (bpm): 143   Movement: Present     General:  Alert, oriented and cooperative. Patient is in no acute distress.  Skin: Skin is warm and dry. No rash noted.   Cardiovascular: Normal heart rate noted  Respiratory: Normal respiratory effort, no problems with respiration noted  Abdomen: Soft, gravid, appropriate for gestational age.  Pain/Pressure: Present     Pelvic: Cervical exam deferred        Extremities: Normal range of motion.  Edema: None  Mental Status: Normal mood and affect. Normal behavior. Normal judgment and thought content.   Assessment and Plan:  Pregnancy: G3P2002 at 158w4d1. Supervision of high risk pregnancy, antepartum FHT and FH normal - Protein / creatinine ratio, urine - [place] OB COMP + 14 WK; Future  2. Maternal chronic hypertension in first trimester BP controlled. UP:C today  3. History of gestational diabetes in prior pregnancy, currently pregnant   Preterm labor symptoms and general obstetric precautions including but not limited to vaginal bleeding, contractions,  leaking of fluid and fetal movement were reviewed in detail with the patient. Please refer to After Visit Summary for other counseling recommendations.  Return in about 1 month (around 05/28/2018) for HR OB f/u.  Future Appointments  Date Time Provider DeNelson5/17/2019  1:30 PM WH-MFC [place] WH-MFCUS MFC-US  05/29/2018 11:15 AM Constant, PeVickii ChafeMD [email or web address]

## 2018-05-01 LAB — PROTEIN / CREATININE RATIO, URINE
Creatinine, Urine: 161.3 mg/dL
PROTEIN/CREAT RATIO: 125 mg/g{creat} (ref 0–200)
Protein, Ur: 20.2 mg/dL

## 2018-05-11 ENCOUNTER — Other Ambulatory Visit (HOSPITAL_COMMUNITY): Payer: Self-pay | Admitting: *Deleted

## 2018-05-11 ENCOUNTER — Ambulatory Visit (HOSPITAL_COMMUNITY)
Admission: RE | Admit: 2018-05-11 | Discharge: 2018-05-11 | Disposition: A | Discharge status: Home / Self Care | Payer: Medicaid Other | Source: Ambulatory Visit | Attending: Family Medicine | Admitting: Family Medicine

## 2018-05-11 ENCOUNTER — Other Ambulatory Visit: Payer: Self-pay | Admitting: Family Medicine

## 2018-05-11 ENCOUNTER — Encounter: Payer: Self-pay | Admitting: Family Medicine

## 2018-05-11 DIAGNOSIS — O10919 Unspecified pre-existing hypertension complicating pregnancy, unspecified trimester: Secondary | ICD-10-CM

## 2018-05-11 DIAGNOSIS — O0992 Supervision of high risk pregnancy, unspecified, second trimester: Secondary | ICD-10-CM | POA: Diagnosis not present

## 2018-05-11 DIAGNOSIS — O10012 Pre-existing essential hypertension complicating pregnancy, second trimester: Secondary | ICD-10-CM | POA: Insufficient documentation

## 2018-05-11 DIAGNOSIS — O359XX0 Maternal care for (suspected) fetal abnormality and damage, unspecified, not applicable or unspecified: Secondary | ICD-10-CM | POA: Diagnosis not present

## 2018-05-11 DIAGNOSIS — Z363 Encounter for antenatal screening for malformations: Secondary | ICD-10-CM | POA: Diagnosis not present

## 2018-05-11 DIAGNOSIS — O0990 Supervision of high risk pregnancy, unspecified, unspecified trimester: Secondary | ICD-10-CM

## 2018-05-11 DIAGNOSIS — Z3689 Encounter for other specified antenatal screening: Secondary | ICD-10-CM | POA: Insufficient documentation

## 2018-05-11 DIAGNOSIS — Z3A18 18 weeks gestation of pregnancy: Secondary | ICD-10-CM | POA: Insufficient documentation

## 2018-05-22 IMAGING — US US MFM OB COMP +14 WKS
1 series · 13 of 28 positions shown · non-contrast
Comparison: none

[Series 1: us mfm ob comp +14 wks · 13 of 101 slices shown]
[im 4/101]
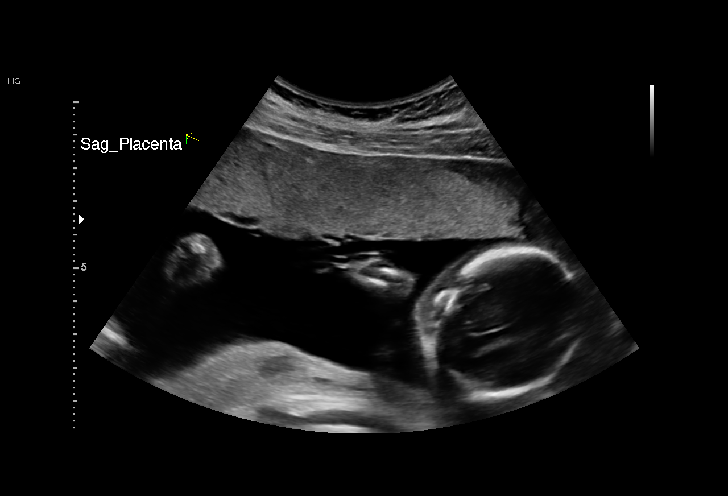
[im 12/101]
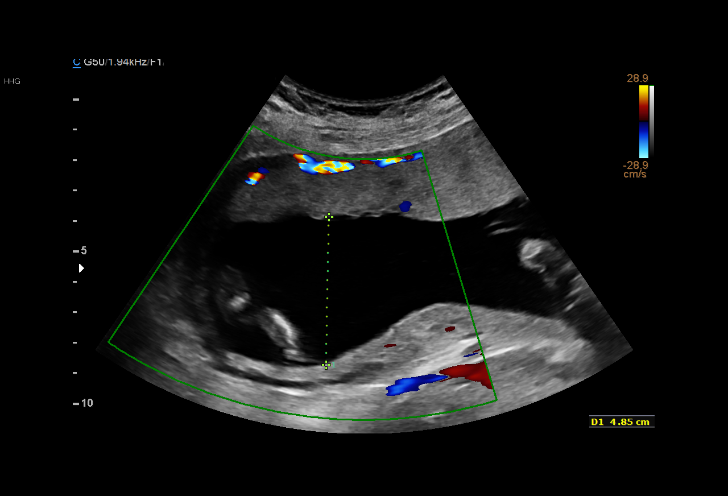
[im 19/101]
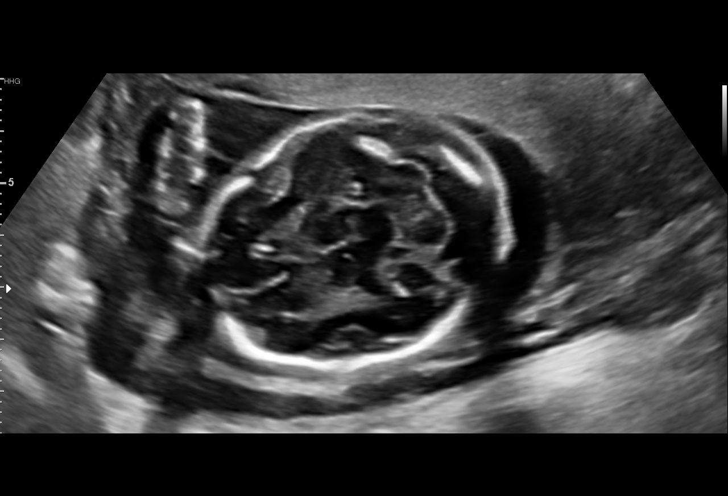
[im 26/101]
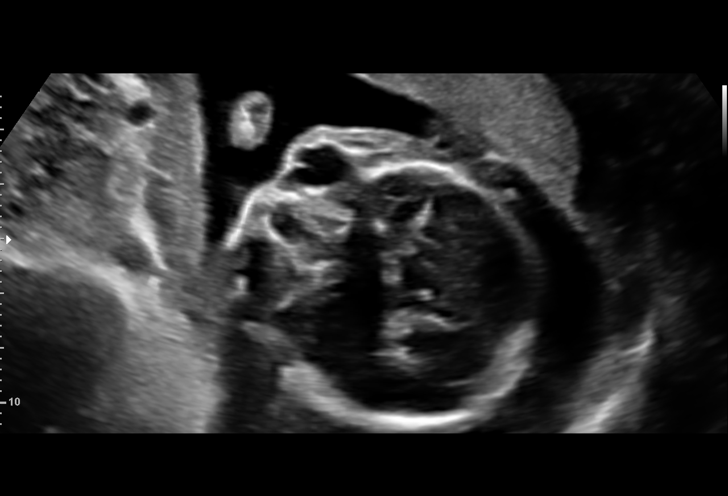
[im 34/101]
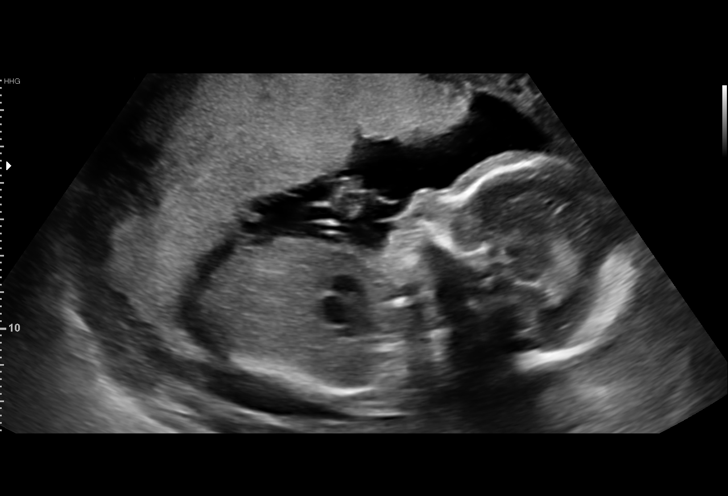
[im 41/101]
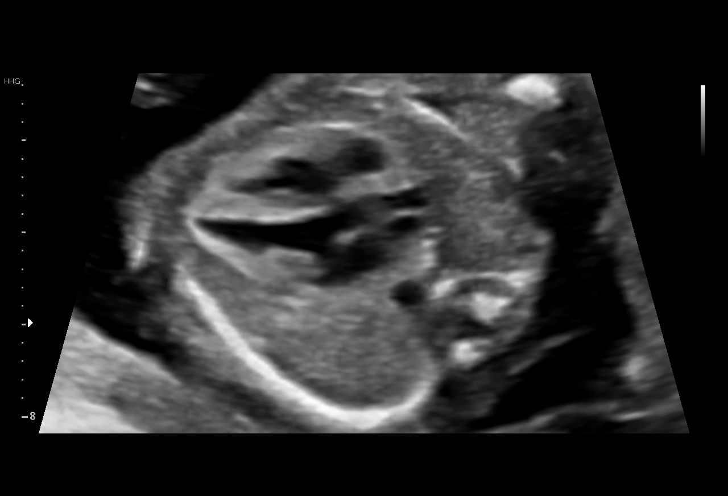
[im 52/101]
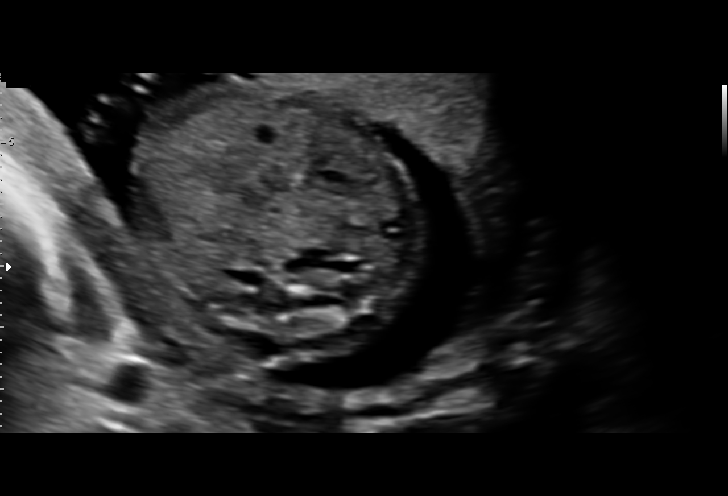
[im 60/101]
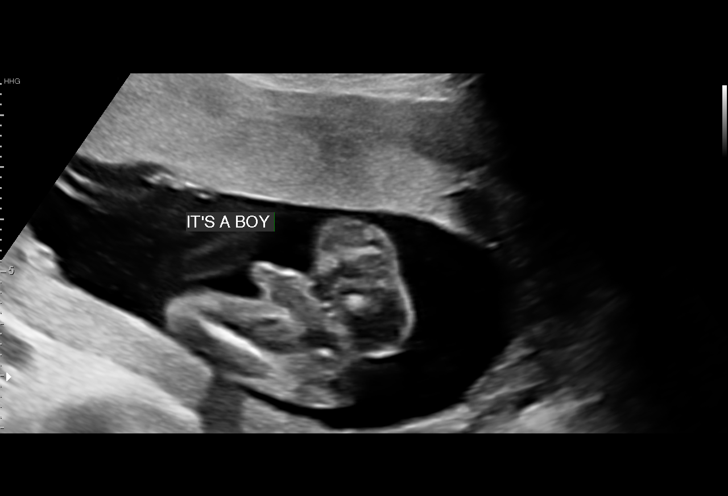
[im 67/101]
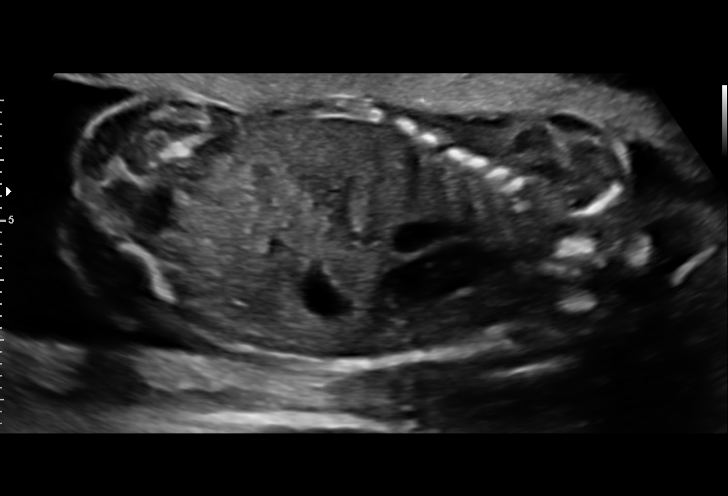
[im 75/101]
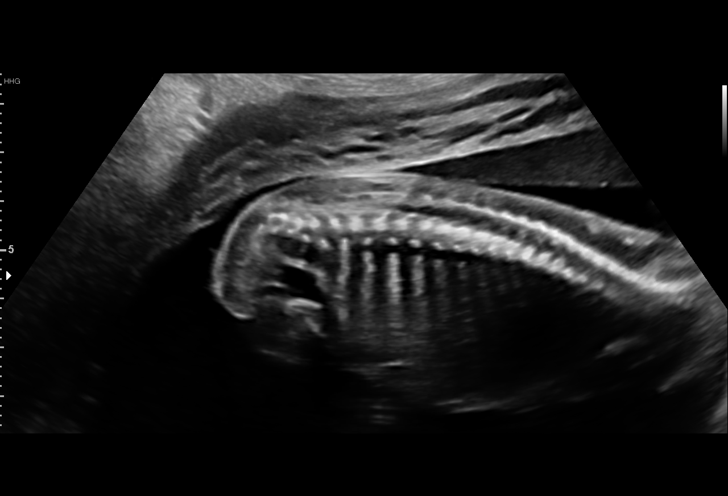
[im 82/101]
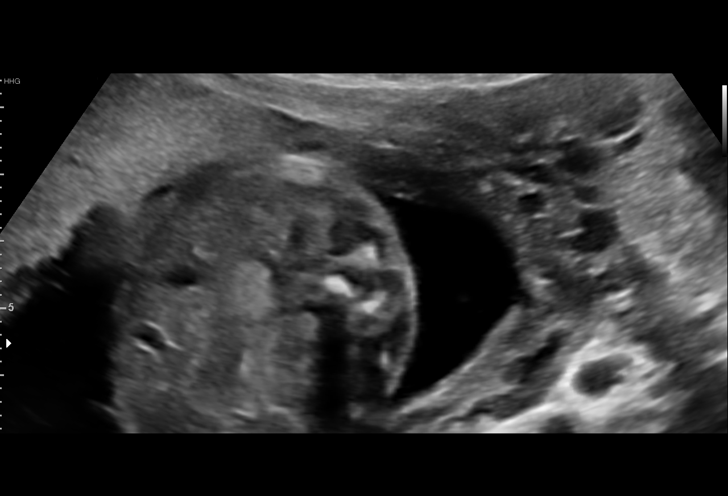
[im 89/101]
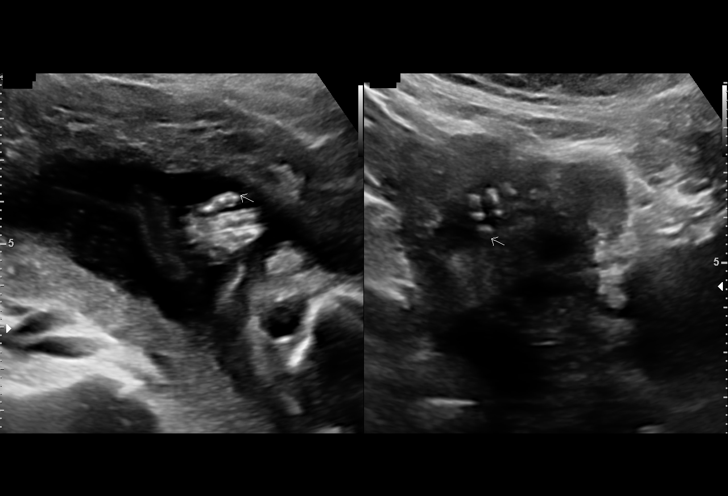
[im 97/101]
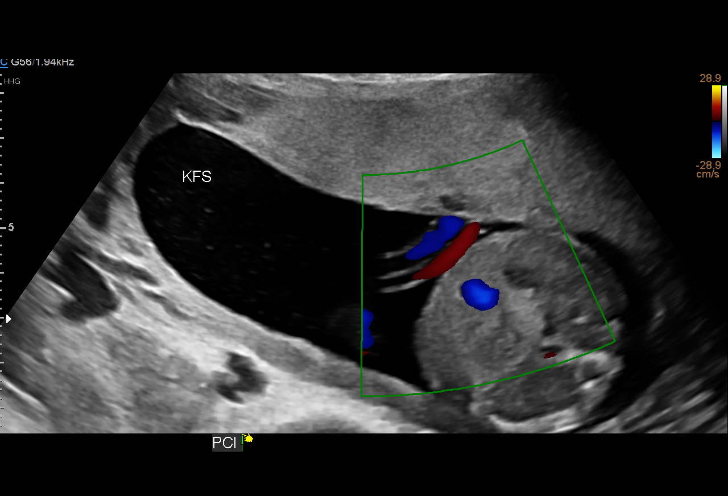

[13 of 28 positions shown; findings below may reference images not displayed]

1  RICARDO CHENG              [id number]      [id number]     [id number]
Indications

19 weeks gestation of pregnancy
Encounter for antenatal screening for
malformations
Poor obstetric history: Previous gestational
diabetes
Pre-existing essential hypertension
complicating pregnancy, second trimester
OB History

Blood Type:            Height:  5'1"   Weight (lb):  149       BMI:
Gravidity:    2         Term:   1
Living:       1
Fetal Evaluation

Num Of Fetuses:     1
Fetal Heart         155
Rate(bpm):
Cardiac Activity:   Observed
Presentation:       Cephalic
Placenta:           Anterior, above cervical os
P. Cord Insertion:  Visualized, central

Amniotic Fluid
AFI FV:      Subjectively within normal limits

Largest Pocket(cm)
4.9
Biometry
BPD:      47.3  mm     G. Age:  20w 2d         87  %    CI:        74.76   %    70 - 86
FL/HC:      18.5   %    16.1 -
HC:      173.6  mm     G. Age:  19w 6d         72  %    HC/AC:      1.15        1.09 -
AC:      150.8  mm     G. Age:  20w 2d         78  %    FL/BPD:     67.9   %
FL:       32.1  mm     G. Age:  20w 0d         68  %    FL/AC:      21.3   %    20 - 24
HUM:      30.5  mm     G. Age:  20w 1d         72  %
CER:      19.5  mm     G. Age:  18w 5d         37  %
NFT:       4.5  mm
LV:        5.9  mm
CM:        5.2  mm

Est. FW:     335  gm    0 lb 12 oz      58  %
Gestational Age

LMP:           19w 2d        Date:  06/27/16                 EDD:   04/03/17
U/S Today:     20w 1d                                        EDD:   03/28/17
Best:          19w 2d     Det. By:  LMP  (06/27/16)          EDD:   04/03/17
Anatomy

Cranium:               Appears normal         Aortic Arch:            Appears normal
Cavum:                 Appears normal         Ductal Arch:            Appears normal
Ventricles:            Appears normal         Diaphragm:              Appears normal
Choroid Plexus:        Appears normal         Stomach:                Appears normal, left
sided
Cerebellum:            Appears normal         Abdomen:                Appears normal
Posterior Fossa:       Appears normal         Abdominal Wall:         Appears nml (cord
insert, abd wall)
Nuchal Fold:           Appears normal         Cord Vessels:           Appears normal (3
vessel cord)
Face:                  Appears normal         Kidneys:                Appear normal
(orbits and profile)
Lips:                  Appears normal         Bladder:                Appears normal
Thoracic:              Appears normal         Spine:                  Appears normal
Heart:                 Appears normal         Upper Extremities:      Appears normal
(4CH, axis, and situs
RVOT:                  Appears normal         Lower Extremities:      Appears normal
LVOT:                  Appears normal

Other:  Fetus appears to be a male. Nasal bone visualized. Heels and 5th
digit visualized. Open hands visualized.
Cervix Uterus Adnexa

Cervix
Length:           3.56  cm.
Normal appearance by transabdominal scan.

Uterus
Single fibroid noted, see table below.

Left Ovary
Size(cm)     3.07   x   2.72   x  1.48      Vol(ml):
Within normal limits.

Right Ovary
Size(cm)     2.95   x   2.29   x  1.66      Vol(ml):
Within normal limits.
Adnexa:       No adnexal mass visualized.
Myomas

Site                     L(cm)      W(cm)      D(cm)      Location
Left

Blood Flow                 RI        PI       Comments

Impression

Singleton intrauterine pregnancy at 19+2 weeks, chronic
hypertension
Review of the anatomy shows no sonographic markers for
aneuploidy or structural anomalies
However, evaluations should be considered suboptimal
secondary to early gestational age
Amniotic fluid volume is normal
Estimated fetal weight is 335g which is growth in the 58th
percentile
Recommendations

Will plan growth evaluations every 4 weeks. would initiate
antepartum fetal testing (weekly BPP or twice-weekly NSTs)
at 30 weeks

## 2018-05-29 ENCOUNTER — Ambulatory Visit (INDEPENDENT_AMBULATORY_CARE_PROVIDER_SITE_OTHER): Payer: Medicaid Other | Admitting: Obstetrics and Gynecology

## 2018-05-29 ENCOUNTER — Other Ambulatory Visit: Payer: Self-pay

## 2018-05-29 ENCOUNTER — Encounter: Payer: Self-pay | Admitting: Obstetrics and Gynecology

## 2018-05-29 VITALS — BP 119/77 | HR 75 | Wt 153.0 lb

## 2018-05-29 DIAGNOSIS — O0990 Supervision of high risk pregnancy, unspecified, unspecified trimester: Secondary | ICD-10-CM

## 2018-05-29 DIAGNOSIS — O0992 Supervision of high risk pregnancy, unspecified, second trimester: Secondary | ICD-10-CM

## 2018-05-29 DIAGNOSIS — O10911 Unspecified pre-existing hypertension complicating pregnancy, first trimester: Secondary | ICD-10-CM

## 2018-05-29 DIAGNOSIS — O10912 Unspecified pre-existing hypertension complicating pregnancy, second trimester: Secondary | ICD-10-CM

## 2018-05-29 DIAGNOSIS — O359XX0 Maternal care for (suspected) fetal abnormality and damage, unspecified, not applicable or unspecified: Secondary | ICD-10-CM

## 2018-05-29 NOTE — Progress Notes (Signed)
   PRENATAL VISIT NOTE  Subjective:  Shelly Adams is a 27 y.o. G3P2002 at 23w5dbeing seen today for ongoing prenatal care.  She is currently monitored for the following issues for this high-risk pregnancy and has Asthma; Steatosis of liver suggested by ultrasound; Supervision of high risk pregnancy, antepartum; Maternal chronic hypertension in first trimester; and Fetal abnormality affecting management of mother on their problem list.  Patient reports no complaints.  Contractions: Irritability. Vag. Bleeding: None.  Movement: Present. Denies leaking of fluid.   The following portions of the patient's history were reviewed and updated as appropriate: allergies, current medications, past family history, past medical history, past social history, past surgical history and problem list. Problem list updated.  Objective:   Vitals:   05/29/18 1133  BP: 119/77  Pulse: 75  Weight: 153 lb (69.4 kg)    Fetal Status: Fetal Heart Rate (bpm): 154 Fundal Height: 22 cm Movement: Present     General:  Alert, oriented and cooperative. Patient is in no acute distress.  Skin: Skin is warm and dry. No rash noted.   Cardiovascular: Normal heart rate noted  Respiratory: Normal respiratory effort, no problems with respiration noted  Abdomen: Soft, gravid, appropriate for gestational age.  Pain/Pressure: Present     Pelvic: Cervical exam deferred        Extremities: Normal range of motion.  Edema: None  Mental Status: Normal mood and affect. Normal behavior. Normal judgment and thought content.   Assessment and Plan:  Pregnancy: G3P2002 at 238w5d1. Supervision of high risk pregnancy, antepartum Patient is doing well without complaints Third trimester labs with glucola next visit - Genetic Screening  2. Maternal chronic hypertension in first trimester Normotensive without medication Continue ASA  3. Fetal abnormality affecting management of mother, single or unspecified fetus Reviewed ultrasound  results with patient Follow up ultrasound scheduled in June [facility]  Preterm labor symptoms and general obstetric precautions including but not limited to vaginal bleeding, contractions, leaking of fluid and fetal movement were reviewed in detail with the patient. Please refer to After Visit Summary for other counseling recommendations.  Return in about 1 month (around 06/26/2018) for [place] Future Appointments  Date Time Provider [place]6/14/2019  1:15 PM WH-MFC [place] WH-MFCUS MFC-US  07/06/2018  1:00 PM WHDixie [place] WH-MFCUS MFC-US  08/03/2018  1:00 PM WH-MFC [place] WH-MFCUS MFC-US    PeMora BellmanMD

## 2018-05-30 ENCOUNTER — Encounter: Payer: Self-pay | Admitting: *Deleted

## 2018-06-08 ENCOUNTER — Other Ambulatory Visit (HOSPITAL_COMMUNITY): Payer: Self-pay | Admitting: Obstetrics and Gynecology

## 2018-06-08 ENCOUNTER — Encounter (HOSPITAL_COMMUNITY): Payer: Self-pay

## 2018-06-08 ENCOUNTER — Ambulatory Visit (HOSPITAL_COMMUNITY)
Admission: RE | Admit: 2018-06-08 | Discharge: 2018-06-08 | Disposition: A | Discharge status: Home / Self Care | Payer: Medicaid Other | Source: Ambulatory Visit | Attending: Obstetrics and Gynecology | Admitting: Obstetrics and Gynecology

## 2018-06-08 DIAGNOSIS — Z362 Encounter for other antenatal screening follow-up: Secondary | ICD-10-CM | POA: Diagnosis not present

## 2018-06-08 DIAGNOSIS — Z3A22 22 weeks gestation of pregnancy: Secondary | ICD-10-CM | POA: Diagnosis not present

## 2018-06-08 DIAGNOSIS — O10912 Unspecified pre-existing hypertension complicating pregnancy, second trimester: Secondary | ICD-10-CM | POA: Diagnosis not present

## 2018-06-08 DIAGNOSIS — O10919 Unspecified pre-existing hypertension complicating pregnancy, unspecified trimester: Secondary | ICD-10-CM

## 2018-06-08 DIAGNOSIS — O0990 Supervision of high risk pregnancy, unspecified, unspecified trimester: Secondary | ICD-10-CM

## 2018-06-08 DIAGNOSIS — O10019 Pre-existing essential hypertension complicating pregnancy, unspecified trimester: Secondary | ICD-10-CM | POA: Diagnosis present

## 2018-06-27 ENCOUNTER — Encounter: Payer: Self-pay | Admitting: Obstetrics and Gynecology

## 2018-06-27 ENCOUNTER — Other Ambulatory Visit: Payer: Self-pay | Admitting: General Practice

## 2018-06-27 ENCOUNTER — Other Ambulatory Visit: Payer: Self-pay

## 2018-06-27 DIAGNOSIS — O0990 Supervision of high risk pregnancy, unspecified, unspecified trimester: Secondary | ICD-10-CM

## 2018-06-29 ENCOUNTER — Encounter: Payer: Self-pay | Admitting: Obstetrics and Gynecology

## 2018-06-29 NOTE — Progress Notes (Signed)
Patient did not keep return OB appointment for 06/27/2018.  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

## 2018-07-04 ENCOUNTER — Encounter: Payer: Self-pay | Admitting: *Deleted

## 2018-07-06 ENCOUNTER — Other Ambulatory Visit (HOSPITAL_COMMUNITY): Payer: Self-pay | Admitting: Obstetrics and Gynecology

## 2018-07-06 ENCOUNTER — Ambulatory Visit (HOSPITAL_COMMUNITY)
Admission: RE | Admit: 2018-07-06 | Discharge: 2018-07-06 | Disposition: A | Discharge status: Home / Self Care | Payer: Medicaid Other | Source: Ambulatory Visit | Attending: Obstetrics & Gynecology | Admitting: Obstetrics & Gynecology

## 2018-07-06 ENCOUNTER — Encounter (HOSPITAL_COMMUNITY): Payer: Self-pay

## 2018-07-06 DIAGNOSIS — O10912 Unspecified pre-existing hypertension complicating pregnancy, second trimester: Secondary | ICD-10-CM | POA: Diagnosis not present

## 2018-07-06 DIAGNOSIS — Z3A26 26 weeks gestation of pregnancy: Secondary | ICD-10-CM | POA: Diagnosis not present

## 2018-07-06 DIAGNOSIS — O10919 Unspecified pre-existing hypertension complicating pregnancy, unspecified trimester: Secondary | ICD-10-CM

## 2018-07-06 DIAGNOSIS — O0990 Supervision of high risk pregnancy, unspecified, unspecified trimester: Secondary | ICD-10-CM

## 2018-07-06 DIAGNOSIS — O359XX0 Maternal care for (suspected) fetal abnormality and damage, unspecified, not applicable or unspecified: Secondary | ICD-10-CM | POA: Diagnosis not present

## 2018-07-06 DIAGNOSIS — Z362 Encounter for other antenatal screening follow-up: Secondary | ICD-10-CM | POA: Diagnosis not present

## 2018-07-06 DIAGNOSIS — O10013 Pre-existing essential hypertension complicating pregnancy, third trimester: Secondary | ICD-10-CM | POA: Diagnosis present

## 2018-07-16 ENCOUNTER — Other Ambulatory Visit: Payer: Self-pay

## 2018-07-16 ENCOUNTER — Encounter: Payer: Self-pay | Admitting: Obstetrics & Gynecology

## 2018-07-20 ENCOUNTER — Other Ambulatory Visit: Payer: Medicaid Other

## 2018-07-20 ENCOUNTER — Ambulatory Visit (INDEPENDENT_AMBULATORY_CARE_PROVIDER_SITE_OTHER): Payer: Medicaid Other | Admitting: Obstetrics & Gynecology

## 2018-07-20 VITALS — BP 123/77 | HR 88

## 2018-07-20 DIAGNOSIS — O0990 Supervision of high risk pregnancy, unspecified, unspecified trimester: Secondary | ICD-10-CM

## 2018-07-20 DIAGNOSIS — O10911 Unspecified pre-existing hypertension complicating pregnancy, first trimester: Secondary | ICD-10-CM

## 2018-07-20 NOTE — Patient Instructions (Signed)
Second Trimester of Pregnancy The second trimester is from week 13 through week 28, month 4 through 6. This is often the time in pregnancy that you feel your best. Often times, morning sickness has lessened or quit. You may have more energy, and you may get hungry more often. Your unborn baby (fetus) is growing rapidly. At the end of the sixth month, he or she is about 9 inches long and weighs about 1 pounds. You will likely feel the baby move (quickening) between 18 and 20 weeks of pregnancy. Follow these instructions at home:  Avoid all smoking, herbs, and alcohol. Avoid drugs not approved by your doctor.  Do not use any tobacco products, including cigarettes, chewing tobacco, and electronic cigarettes. If you need help quitting, ask your doctor. You may get counseling or other support to help you quit.  Only take medicine as told by your doctor. Some medicines are safe and some are not during pregnancy.  Exercise only as told by your doctor. Stop exercising if you start having cramps.  Eat regular, healthy meals.  Wear a good support bra if your breasts are tender.  Do not use hot tubs, steam rooms, or saunas.  Wear your seat belt when driving.  Avoid raw meat, uncooked cheese, and liter boxes and soil used by cats.  Take your prenatal vitamins.  Take 1500-2000 milligrams of calcium daily starting at the 20th week of pregnancy until you deliver your baby.  Try taking medicine that helps you poop (stool softener) as needed, and if your doctor approves. Eat more fiber by eating fresh fruit, vegetables, and whole grains. Drink enough fluids to keep your pee (urine) clear or pale yellow.  Take warm water baths (sitz baths) to soothe pain or discomfort caused by hemorrhoids. Use hemorrhoid cream if your doctor approves.  If you have puffy, bulging veins (varicose veins), wear support hose. Raise (elevate) your feet for 15 minutes, 3-4 times a day. Limit salt in your diet.  Avoid heavy  lifting, wear low heals, and sit up straight.  Rest with your legs raised if you have leg cramps or low back pain.  Visit your dentist if you have not gone during your pregnancy. Use a soft toothbrush to brush your teeth. Be gentle when you floss.  You can have sex (intercourse) unless your doctor tells you not to.  Go to your doctor visits. Get help if:  You feel dizzy.  You have mild cramps or pressure in your lower belly (abdomen).  You have a nagging pain in your belly area.  You continue to feel sick to your stomach (nauseous), throw up (vomit), or have watery poop (diarrhea).  You have bad smelling fluid coming from your vagina.  You have pain with peeing (urination). Get help right away if:  You have a fever.  You are leaking fluid from your vagina.  You have spotting or bleeding from your vagina.  You have severe belly cramping or pain.  You lose or gain weight rapidly.  You have trouble catching your breath and have chest pain.  You notice sudden or extreme puffiness (swelling) of your face, hands, ankles, feet, or legs.  You have not felt the baby move in over an hour.  You have severe headaches that do not go away with medicine.  You have vision changes. This information is not intended to replace advice given to you by your health care provider. Make sure you discuss any questions you have with your health care  provider. Document Released: 03/08/2010 Document Revised: 05/19/2016 Document Reviewed: 02/12/2013 Elsevier Interactive Patient Education  2017 [employer].

## 2018-07-20 NOTE — Progress Notes (Signed)
   PRENATAL VISIT NOTE  Subjective:  Shelly Adams is a 27 y.o. G3P2002 at 71w5dbeing seen today for ongoing prenatal care.  She is currently monitored for the following issues for this high-risk pregnancy and has Asthma; Steatosis of liver suggested by ultrasound; Supervision of high risk pregnancy, antepartum; Maternal chronic hypertension in first trimester; and Fetal abnormality affecting management of mother on their problem list.  Patient reports fatigue.  Contractions: Irritability. Vag. Bleeding: None.  Movement: Present. Denies leaking of fluid.   The following portions of the patient's history were reviewed and updated as appropriate: allergies, current medications, past family history, past medical history, past social history, past surgical history and problem list. Problem list updated.  Objective:   Vitals:   07/20/18 0941  BP: 123/77  Pulse: 88    Fetal Status: Fetal Heart Rate (bpm): 145   Movement: Present     General:  Alert, oriented and cooperative. Patient is in no acute distress.  Skin: Skin is warm and dry. No rash noted.   Cardiovascular: Normal heart rate noted  Respiratory: Normal respiratory effort, no problems with respiration noted  Abdomen: Soft, gravid, appropriate for gestational age.  Pain/Pressure: Present     Pelvic: Cervical exam deferred        Extremities: Normal range of motion.  Edema: Trace  Mental Status: Normal mood and affect. Normal behavior. Normal judgment and thought content.   Assessment and Plan:  Pregnancy: G3P2002 at 226w5d1. Supervision of high risk pregnancy, antepartum   2. Maternal chronic hypertension in first trimester Routine 28 weeks  Preterm labor symptoms and general obstetric precautions including but not limited to vaginal bleeding, contractions, leaking of fluid and fetal movement were reviewed in detail with the patient. Please refer to After Visit Summary for other counseling recommendations.  Return in about  2 weeks (around 08/03/2018).  Future Appointments  Date Time Provider [place]7/26/2019 10:00 AM WOC-WOCA LAB WOC-WOCA WOC  08/03/2018  1:00 PM WH-MFC [place] WH-MFCUS MFC-US    JaEmeterio ReeveMD

## 2018-07-21 LAB — CBC
HEMATOCRIT: 33 % — AB (ref 34.0–46.6)
Hemoglobin: 10.8 g/dL — ABNORMAL LOW (ref 11.1–15.9)
MCH: 29.6 pg (ref 26.6–33.0)
MCHC: 32.7 g/dL (ref 31.5–35.7)
MCV: 90 fL (ref 79–97)
Platelets: 199 10*3/uL (ref 150–450)
RBC: 3.65 x10E6/uL — AB (ref 3.77–5.28)
RDW: 13.5 % (ref 12.3–15.4)
WBC: 9.2 10*3/uL (ref 3.4–10.8)

## 2018-07-21 LAB — GLUCOSE TOLERANCE, 2 HOURS W/ 1HR
GLUCOSE, 1 HOUR: 202 mg/dL — AB (ref 65–179)
Glucose, 2 hour: 101 mg/dL (ref 65–152)
Glucose, Fasting: 84 mg/dL (ref 65–91)

## 2018-07-21 LAB — RPR: RPR Ser Ql: NONREACTIVE

## 2018-07-21 LAB — HIV ANTIBODY (ROUTINE TESTING W REFLEX): HIV SCREEN 4TH GENERATION: NONREACTIVE

## 2018-07-23 ENCOUNTER — Encounter: Payer: Self-pay | Admitting: Obstetrics and Gynecology

## 2018-07-23 ENCOUNTER — Telehealth: Payer: Self-pay | Admitting: *Deleted

## 2018-07-23 DIAGNOSIS — O24419 Gestational diabetes mellitus in pregnancy, unspecified control: Secondary | ICD-10-CM

## 2018-07-23 HISTORY — DX: Gestational diabetes mellitus in pregnancy, unspecified control: O24.419

## 2018-07-23 MED ORDER — ACCU-CHEK GUIDE W/DEVICE KIT: 1.0000 | PACK | Freq: Once | 0 refills | Status: AC

## 2018-07-23 MED ORDER — GLUCOSE BLOOD VI STRP: ORAL_STRIP | 12 refills | Status: DC

## 2018-07-23 MED ORDER — ACCU-CHEK FASTCLIX LANCETS MISC: 1.0000 | Freq: Four times a day (QID) | 12 refills | Status: DC

## 2018-07-23 NOTE — Telephone Encounter (Signed)
-----   Message from Aletha Halim, MD sent at 07/23/2018 10:02 AM EDT ----- New GDM diagnosis. Please set her up with classes, supplies, babyscripts, etc? thanks

## 2018-07-23 NOTE — Telephone Encounter (Signed)
I called  Shelly Adams and notified her that her 2 hr gtt was elevated indicating she has GDM. I explained we will have her come in to see the educator who will explain GDM, her diet and how to do cbg's. She agreed to appt this Thursday 07/27/18 and I explained will send rx for her meter and supplies to her pharmacy and she should bring those to her appt. She voices understanding.

## 2018-07-25 ENCOUNTER — Other Ambulatory Visit (HOSPITAL_COMMUNITY): Payer: Self-pay | Admitting: *Deleted

## 2018-07-25 DIAGNOSIS — O10913 Unspecified pre-existing hypertension complicating pregnancy, third trimester: Secondary | ICD-10-CM

## 2018-07-26 ENCOUNTER — Encounter: Payer: Medicaid Other | Attending: Obstetrics and Gynecology | Admitting: *Deleted

## 2018-07-26 ENCOUNTER — Ambulatory Visit: Payer: Medicaid Other | Admitting: *Deleted

## 2018-07-26 DIAGNOSIS — Z713 Dietary counseling and surveillance: Secondary | ICD-10-CM | POA: Diagnosis not present

## 2018-07-26 DIAGNOSIS — O24419 Gestational diabetes mellitus in pregnancy, unspecified control: Secondary | ICD-10-CM | POA: Insufficient documentation

## 2018-07-26 DIAGNOSIS — Z3A00 Weeks of gestation of pregnancy not specified: Secondary | ICD-10-CM | POA: Diagnosis not present

## 2018-07-26 NOTE — Progress Notes (Signed)
  Patient was seen on 07/26/2018 for Gestational Diabetes self-management. EDD 10/07/2018. Patient states history of GDM with 1st pregnancy about 5 years ago, but not with her second one. Diet history obtained. Patient eats good variety of all food groups. She prefers to get her protein from beans and legumes rather than from meat. Beverages include only water.  The following learning objectives were met by the patient :   States the definition of Gestational Diabetes  States why dietary management is important in controlling blood glucose  Describes the effects of carbohydrates on blood glucose levels  Demonstrates ability to create a balanced meal plan  Demonstrates carbohydrate counting   States when to check blood glucose levels  Demonstrates proper blood glucose monitoring techniques  States the effect of stress and exercise on blood glucose levels  States the importance of limiting caffeine and abstaining from alcohol and smoking  Plan:  Aim for 3 Carb Choices per meal (45 grams) +/- 1 either way  Aim for 1-2 Carbs per snack Begin reading food labels for Total Carbohydrate of foods If OK with your MD, consider  increasing your activity level by walking, Arm Chair Exercises or other activity daily as tolerated Begin checking BG before breakfast and 2 hours after first bite of breakfast, lunch and dinner as directed by MD  Bring Log Book/Sheet to every medical appointment   Baby Scripts:  Patient was introduced to [employer] and plans to use as record of BG electronically Take medication if directed by MD  Patient already has a meter: [employer] Patient instructed to test pre breakfast and 2 hours each meal as directed by MD  Patient instructed to monitor glucose levels: FBS: 60 - 95 mg/dl 2 hour: <120 mg/dl  Patient received the following handouts:  Nutrition Diabetes and Pregnancy  Carbohydrate Counting List  BG Log Sheet  Patient will be seen for follow-up as  needed.

## 2018-08-03 ENCOUNTER — Ambulatory Visit (HOSPITAL_COMMUNITY)
Admission: RE | Admit: 2018-08-03 | Discharge: 2018-08-03 | Disposition: A | Discharge status: Home / Self Care | Payer: Medicaid Other | Source: Ambulatory Visit | Attending: Obstetrics & Gynecology | Admitting: Obstetrics & Gynecology

## 2018-08-03 ENCOUNTER — Encounter (HOSPITAL_COMMUNITY): Payer: Self-pay

## 2018-08-03 DIAGNOSIS — O24410 Gestational diabetes mellitus in pregnancy, diet controlled: Secondary | ICD-10-CM | POA: Insufficient documentation

## 2018-08-03 DIAGNOSIS — Z3A30 30 weeks gestation of pregnancy: Secondary | ICD-10-CM | POA: Diagnosis not present

## 2018-08-03 DIAGNOSIS — O359XX0 Maternal care for (suspected) fetal abnormality and damage, unspecified, not applicable or unspecified: Secondary | ICD-10-CM | POA: Diagnosis not present

## 2018-08-03 DIAGNOSIS — O10913 Unspecified pre-existing hypertension complicating pregnancy, third trimester: Secondary | ICD-10-CM | POA: Diagnosis not present

## 2018-08-03 DIAGNOSIS — O10013 Pre-existing essential hypertension complicating pregnancy, third trimester: Secondary | ICD-10-CM | POA: Diagnosis not present

## 2018-08-03 DIAGNOSIS — O0990 Supervision of high risk pregnancy, unspecified, unspecified trimester: Secondary | ICD-10-CM

## 2018-08-06 ENCOUNTER — Other Ambulatory Visit (HOSPITAL_COMMUNITY): Payer: Self-pay | Admitting: *Deleted

## 2018-08-06 DIAGNOSIS — O10919 Unspecified pre-existing hypertension complicating pregnancy, unspecified trimester: Secondary | ICD-10-CM

## 2018-08-08 ENCOUNTER — Ambulatory Visit (INDEPENDENT_AMBULATORY_CARE_PROVIDER_SITE_OTHER): Payer: Medicaid Other | Admitting: Obstetrics & Gynecology

## 2018-08-08 VITALS — BP 123/71 | HR 98 | Wt 164.4 lb

## 2018-08-08 DIAGNOSIS — O24410 Gestational diabetes mellitus in pregnancy, diet controlled: Secondary | ICD-10-CM

## 2018-08-08 DIAGNOSIS — O0990 Supervision of high risk pregnancy, unspecified, unspecified trimester: Secondary | ICD-10-CM

## 2018-08-08 DIAGNOSIS — O10913 Unspecified pre-existing hypertension complicating pregnancy, third trimester: Secondary | ICD-10-CM

## 2018-08-08 DIAGNOSIS — O0993 Supervision of high risk pregnancy, unspecified, third trimester: Secondary | ICD-10-CM

## 2018-08-08 LAB — GLUCOSE, CAPILLARY: GLUCOSE-CAPILLARY: 116 mg/dL — AB (ref 70–99)

## 2018-08-08 LAB — POCT URINALYSIS DIP (DEVICE)
Bilirubin Urine: NEGATIVE
GLUCOSE, UA: NEGATIVE mg/dL
KETONES UR: NEGATIVE mg/dL
Nitrite: NEGATIVE
Protein, ur: NEGATIVE mg/dL
SPECIFIC GRAVITY, URINE: 1.02 (ref 1.005–1.030)
Urobilinogen, UA: 0.2 mg/dL (ref 0.0–1.0)
pH: 7 (ref 5.0–8.0)

## 2018-08-08 MED ORDER — ASPIRIN EC 81 MG PO TBEC
81.0000 mg | DELAYED_RELEASE_TABLET | Freq: Every day | ORAL | 2 refills | Status: DC
Start: 2018-08-08 — End: 2018-09-26

## 2018-08-08 NOTE — Progress Notes (Signed)
C/o lower back pain. Dioscussed maternity belt usage. Declines tdap.

## 2018-08-08 NOTE — Patient Instructions (Signed)
Return to clinic for any scheduled appointments or obstetric concerns, or go to [facility] for evaluation  

## 2018-08-08 NOTE — Progress Notes (Signed)
BTL papers signed.

## 2018-08-08 NOTE — Progress Notes (Signed)
PRENATAL VISIT NOTE  Subjective:  Shelly Adams is a 27 y.o. G3P2002 at 20w3dbeing seen today for ongoing prenatal care.  She is currently monitored for the following issues for this high-risk pregnancy and has Asthma; Steatosis of liver suggested by ultrasound; Supervision of high risk pregnancy, antepartum; Maternal chronic hypertension, third trimester; Fetal abnormality affecting management of mother; and GDM (gestational diabetes mellitus) on their problem list.  Patient reports no complaints.  Contractions: Not present.  .  Movement: Present. Denies leaking of fluid.   The following portions of the patient's history were reviewed and updated as appropriate: allergies, current medications, past family history, past medical history, past social history, past surgical history and problem list. Problem list updated.  Objective:   Vitals:   08/08/18 1336  BP: 123/71  Pulse: 98  Weight: 164 lb 6.4 oz (74.6 kg)    Fetal Status: Fetal Heart Rate (bpm): 145 Fundal Height: 31 cm Movement: Present     General:  Alert, oriented and cooperative. Patient is in no acute distress.  Skin: Skin is warm and dry. No rash noted.   Cardiovascular: Normal heart rate noted  Respiratory: Normal respiratory effort, no problems with respiration noted  Abdomen: Soft, gravid, appropriate for gestational age.  Pain/Pressure: Present     Pelvic: Cervical exam deferred        Extremities: Normal range of motion.  Edema: Trace  Mental Status: Normal mood and affect. Normal behavior. Normal judgment and thought content.  [place] Ob Follow Up  Result Date: 08/03/2018 ----------------------------------------------------------------------  OBSTETRICS REPORT                      (Signed Final 08/03/2018 04:49 pm) ---------------------------------------------------------------------- Patient Info  ID #:       [id number]                         D.O.B.:  [id number](27 yrs)  Name:       EWhitney Post                   Visit Date: 08/03/2018 01:22 pm ---------------------------------------------------------------------- Performed By  Performed By:     AHubert Azure         Ref. Address:     [address]  [place]  Attending:        Tama High MD        Location:         [facility]  Referred By:      Truett Mainland                    MD ---------------------------------------------------------------------- Orders   #  Description                                 Code   1  [place] MFM OB FOLLOW UP                         [phone]  ----------------------------------------------------------------------   #  Ordered By               Order #        Accession #    Episode #   1  Tama High             [id number]      [id number]     [id number]  ---------------------------------------------------------------------- Indications   [redacted] weeks gestation of pregnancy                Z3A.30   Hypertension - Chronic/Pre-existing            O10.019   Fetal abnormality - other known or             O35.9XX0   suspected (Bilat UTD)   Gestational diabetes in pregnancy, diet        O24.410   controlled  ---------------------------------------------------------------------- OB History  Blood Type:            Height:  5'     Weight (lb):  150       BMI:  29.29  Gravidity:    3         Term:   2  Living:       2 ---------------------------------------------------------------------- Fetal Evaluation  Num Of Fetuses:     1  Fetal Heart         167  Rate(bpm):  Cardiac Activity:   Observed  Presentation:       Cephalic  Placenta:           Anterior  P. Cord Insertion:  Visualized, central  Amniotic Fluid  AFI FV:      Subjectively within normal limits  AFI Sum(cm)     %Tile       Largest Pocket(cm)  18.32           69          6.45  RUQ(cm)        RLQ(cm)       LUQ(cm)        LLQ(cm)  6.45          4.07          3.73           4.07 ---------------------------------------------------------------------- Biometry  BPD:      77.9  mm     G. Age:  31w 2d         56  %    CI:        77.21   %    70 - 86  FL/HC:      20.6   %    19.3 - 21.3  HC:      280.7  mm     G. Age:  30w 5d         18  %    HC/AC:      0.99        0.96 - 1.17  AC:      284.5  mm     G. Age:  32w 3d         90  %    FL/BPD:     74.3   %    71 - 87  FL:       57.9  mm     G. Age:  30w 2d         24  %    FL/AC:      20.4   %    20 - 24  HUM:      52.6  mm     G. Age:  30w 5d         52  %  Est. FW:    1788  gm    3 lb 15 oz      71  % ---------------------------------------------------------------------- Gestational Age  LMP:           32w 1d        Date:  12/21/17                 EDD:   09/27/18  U/S Today:     31w 1d                                        EDD:   10/04/18  Best:          30w 5d     Det. By:  U/S  (05/11/18)          EDD:   10/07/18 ---------------------------------------------------------------------- Anatomy  Cranium:               Appears normal         Aortic Arch:            Previously seen  Cavum:                 Previously seen        Ductal Arch:            Previously seen  Ventricles:            Appears normal         Diaphragm:              Appears normal  Choroid Plexus:        Previously seen        Stomach:                Appears normal, left                                                                        sided  Cerebellum:  Previously seen        Abdomen:                Appears normal  Posterior Fossa:       Previously seen        Abdominal Wall:         Previously seen  Nuchal Fold:           Previously seen        Cord Vessels:           Previously seen  Face:                  Orbits and profile     Kidneys:                Appear normal                         previously seen  Lips:                   Previously seen        Bladder:                Appears normal  Thoracic:              Appears normal         Spine:                  Previously seen  Heart:                 Previously seen        Upper Extremities:      Previously seen  RVOT:                  Appears normal         Lower Extremities:      Previously seen  LVOT:                  Appears normal  Other:  Fetus appears to be a female. ---------------------------------------------------------------------- Cervix Uterus Adnexa  Cervix  Not visualized (advanced GA >24wks)  Uterus  No abnormality visualized.  Left Ovary  Not visualized.  Right Ovary  Not visualized.  Adnexa:       No abnormality visualized. ---------------------------------------------------------------------- Impression  Chronic hypertension. Well-controlled without  antihypertensives. Newly-diagnosed gestational diabetes.  Well-controlled on diet.  Amniotic fluid is normal and good fetal activity is seen. Fetal  growth is appropriate for gestational age. ---------------------------------------------------------------------- Recommendations  -An appointment was made for her to return in 4 weeks for  fetal growth assessment.  -Weekly antenatal testing from 36 weeks if GDM remains well-  controlled on diet. ----------------------------------------------------------------------                  Tama High, MD Electronically Signed Final Report   08/03/2018 04:49 pm ----------------------------------------------------------------------   Assessment and Plan:  Pregnancy: [id number] at [redacted]w[redacted]d 1. Maternal chronic hypertension, third trimester Stable BP <140/90, no antihypertensive medications, no ASA - antenatal testing to start 36 weeks as per MFM (Donalee Citrin.  Patient is agreeable to this due to childcare issues.  - aspirin EC 81 MG tablet; Take 1 tablet (81 mg total) by mouth daily. Take after 12 weeks for prevention of preeclampsia later in pregnancy  Dispense: 300 tablet;  Refill: 2  2. Diet controlled gestational diabetes mellitus (GDM), antepartum Reports fastings in 90s, PP in 120s but forget log book.  Random CBG in clinic 116. Continue diet and exercise control,  3. Supervision of high risk pregnancy, antepartum BTL papers signed today, considering BTL Preterm labor symptoms and general obstetric precautions including but not limited to vaginal bleeding, contractions, leaking of fluid and fetal movement were reviewed in detail with the patient. Please refer to After Visit Summary for other counseling recommendations.  Return in about 2 weeks (around 08/22/2018) for OB Visit ([place]).  Future Appointments  Date Time Provider [place]  09/03/2018  1:00 PM WH-MFC [place] 3 WH-MFCUS MFC-US    Verita Schneiders, MD

## 2018-08-14 ENCOUNTER — Encounter: Payer: Self-pay | Admitting: *Deleted

## 2018-08-24 ENCOUNTER — Ambulatory Visit (INDEPENDENT_AMBULATORY_CARE_PROVIDER_SITE_OTHER): Payer: Medicaid Other | Admitting: Obstetrics and Gynecology

## 2018-08-24 ENCOUNTER — Encounter: Payer: Self-pay | Admitting: Obstetrics and Gynecology

## 2018-08-24 VITALS — BP 133/88 | HR 78 | Wt 167.3 lb

## 2018-08-24 DIAGNOSIS — O10913 Unspecified pre-existing hypertension complicating pregnancy, third trimester: Secondary | ICD-10-CM

## 2018-08-24 DIAGNOSIS — Z3009 Encounter for other general counseling and advice on contraception: Secondary | ICD-10-CM

## 2018-08-24 DIAGNOSIS — O0990 Supervision of high risk pregnancy, unspecified, unspecified trimester: Secondary | ICD-10-CM

## 2018-08-24 DIAGNOSIS — O24410 Gestational diabetes mellitus in pregnancy, diet controlled: Secondary | ICD-10-CM

## 2018-08-24 NOTE — Progress Notes (Signed)
Subjective:  Shelly Adams is a 27 y.o. G3P2002 at 87w5dbeing seen today for ongoing prenatal care.  She is currently monitored for the following issues for this high-risk pregnancy and has Asthma; Steatosis of liver suggested by ultrasound; Supervision of high risk pregnancy, antepartum; Maternal chronic hypertension, third trimester; GDM (gestational diabetes mellitus); and Unwanted fertility on their problem list.  Patient reports carpal tunnel symptoms.  Contractions: Irritability. Vag. Bleeding: None.  Movement: Present. Denies leaking of fluid.   The following portions of the patient's history were reviewed and updated as appropriate: allergies, current medications, past family history, past medical history, past social history, past surgical history and problem list. Problem list updated.  Objective:   Vitals:   08/24/18 1004  BP: 133/88  Pulse: 78  Weight: 167 lb 4.8 oz (75.9 kg)    Fetal Status: Fetal Heart Rate (bpm): 142   Movement: Present     General:  Alert, oriented and cooperative. Patient is in no acute distress.  Skin: Skin is warm and dry. No rash noted.   Cardiovascular: Normal heart rate noted  Respiratory: Normal respiratory effort, no problems with respiration noted  Abdomen: Soft, gravid, appropriate for gestational age. Pain/Pressure: Present     Pelvic:  Cervical exam deferred        Extremities: Normal range of motion.  Edema: None  Mental Status: Normal mood and affect. Normal behavior. Normal judgment and thought content.   Urinalysis:      Assessment and Plan:  Pregnancy: G3P2002 at 349w5d1. Supervision of high risk pregnancy, antepartum Stable  2. Maternal chronic hypertension, third trimester BP stable without meds Continue with qd BASA Antenatal test at 36 weeks per MFM  3. Diet controlled gestational diabetes mellitus (GDM), antepartum Forgot CBG readings but reports in goal range Continue with diet  4. Unwanted fertility BTL papers  signed  Preterm labor symptoms and general obstetric precautions including but not limited to vaginal bleeding, contractions, leaking of fluid and fetal movement were reviewed in detail with the patient. Please refer to After Visit Summary for other counseling recommendations.  Return in about 2 weeks (around 09/07/2018).   ErChancy MilroyMD

## 2018-08-24 NOTE — Patient Instructions (Signed)
Third Trimester of Pregnancy The third trimester is from week 28 through week 40 (months 7 through 9). The third trimester is a time when the unborn baby (fetus) is growing rapidly. At the end of the ninth month, the fetus is about 20 inches in length and weighs 6-10 pounds. Body changes during your third trimester Your body will continue to go through many changes during pregnancy. The changes vary from woman to woman. During the third trimester:  Your weight will continue to increase. You can expect to gain 25-35 pounds (11-16 kg) by the end of the pregnancy.  You may begin to get stretch marks on your hips, abdomen, and breasts.  You may urinate more often because the fetus is moving lower into your pelvis and pressing on your bladder.  You may develop or continue to have heartburn. This is caused by increased hormones that slow down muscles in the digestive tract.  You may develop or continue to have constipation because increased hormones slow digestion and cause the muscles that push waste through your intestines to relax.  You may develop hemorrhoids. These are swollen veins (varicose veins) in the rectum that can itch or be painful.  You may develop swollen, bulging veins (varicose veins) in your legs.  You may have increased body aches in the pelvis, back, or thighs. This is due to weight gain and increased hormones that are relaxing your joints.  You may have changes in your hair. These can include thickening of your hair, rapid growth, and changes in texture. Some women also have hair loss during or after pregnancy, or hair that feels dry or thin. Your hair will most likely return to normal after your baby is born.  Your breasts will continue to grow and they will continue to become tender. A yellow fluid (colostrum) may leak from your breasts. This is the first milk you are producing for your baby.  Your belly button may stick out.  You may notice more swelling in your hands,  face, or ankles.  You may have increased tingling or numbness in your hands, arms, and legs. The skin on your belly may also feel numb.  You may feel short of breath because of your expanding uterus.  You may have more problems sleeping. This can be caused by the size of your belly, increased need to urinate, and an increase in your body's metabolism.  You may notice the fetus "dropping," or moving lower in your abdomen (lightening).  You may have increased vaginal discharge.  You may notice your joints feel loose and you may have pain around your pelvic bone.  What to expect at prenatal visits You will have prenatal exams every 2 weeks until week 36. Then you will have weekly prenatal exams. During a routine prenatal visit:  You will be weighed to make sure you and the baby are growing normally.  Your blood pressure will be taken.  Your abdomen will be measured to track your baby's growth.  The fetal heartbeat will be listened to.  Any test results from the previous visit will be discussed.  You may have a cervical check near your due date to see if your cervix has softened or thinned (effaced).  You will be tested for Group B streptococcus. This happens between 35 and 37 weeks.  Your health care provider may ask you:  What your birth plan is.  How you are feeling.  If you are feeling the baby move.  If you have had  any abnormal symptoms, such as leaking fluid, bleeding, severe headaches, or abdominal cramping.  If you are using any tobacco products, including cigarettes, chewing tobacco, and electronic cigarettes.  If you have any questions.  Other tests or screenings that may be performed during your third trimester include:  Blood tests that check for low iron levels (anemia).  Fetal testing to check the health, activity level, and growth of the fetus. Testing is done if you have certain medical conditions or if there are problems during the  pregnancy.  Nonstress test (NST). This test checks the health of your baby to make sure there are no signs of problems, such as the baby not getting enough oxygen. During this test, a belt is placed around your belly. The baby is made to move, and its heart rate is monitored during movement.  What is false labor? False labor is a condition in which you feel small, irregular tightenings of the muscles in the womb (contractions) that usually go away with rest, changing position, or drinking water. These are called Braxton Hicks contractions. Contractions may last for hours, days, or even weeks before true labor sets in. If contractions come at regular intervals, become more frequent, increase in intensity, or become painful, you should see your health care provider. What are the signs of labor?  Abdominal cramps.  Regular contractions that start at 10 minutes apart and become stronger and more frequent with time.  Contractions that start on the top of the uterus and spread down to the lower abdomen and back.  Increased pelvic pressure and dull back pain.  A watery or bloody mucus discharge that comes from the vagina.  Leaking of amniotic fluid. This is also known as your "water breaking." It could be a slow trickle or a gush. Let your health care provider know if it has a color or strange odor. If you have any of these signs, call your health care provider right away, even if it is before your due date. Follow these instructions at home: Medicines  Follow your health care provider's instructions regarding medicine use. Specific medicines may be either safe or unsafe to take during pregnancy.  Take a prenatal vitamin that contains at least 600 micrograms (mcg) of folic acid.  If you develop constipation, try taking a stool softener if your health care provider approves. Eating and drinking  Eat a balanced diet that includes fresh fruits and vegetables, whole grains, good sources of protein  such as meat, eggs, or tofu, and low-fat dairy. Your health care provider will help you determine the amount of weight gain that is right for you.  Avoid raw meat and uncooked cheese. These carry germs that can cause birth defects in the baby.  If you have low calcium intake from food, talk to your health care provider about whether you should take a daily calcium supplement.  Eat four or five small meals rather than three large meals a day.  Limit foods that are high in fat and processed sugars, such as fried and sweet foods.  To prevent constipation: ? Drink enough fluid to keep your urine clear or pale yellow. ? Eat foods that are high in fiber, such as fresh fruits and vegetables, whole grains, and beans. Activity  Exercise only as directed by your health care provider. Most women can continue their usual exercise routine during pregnancy. Try to exercise for 30 minutes at least 5 days a week. Stop exercising if you experience uterine contractions.  Avoid heavy  lifting.  Do not exercise in extreme heat or humidity, or at high altitudes.  Wear low-heel, comfortable shoes.  Practice good posture.  You may continue to have sex unless your health care provider tells you otherwise. Relieving pain and discomfort  Take frequent breaks and rest with your legs elevated if you have leg cramps or low back pain.  Take warm sitz baths to soothe any pain or discomfort caused by hemorrhoids. Use hemorrhoid cream if your health care provider approves.  Wear a good support bra to prevent discomfort from breast tenderness.  If you develop varicose veins: ? Wear support pantyhose or compression stockings as told by your healthcare provider. ? Elevate your feet for 15 minutes, 3-4 times a day. Prenatal care  Write down your questions. Take them to your prenatal visits.  Keep all your prenatal visits as told by your health care provider. This is important. Safety  Wear your seat belt at  all times when driving.  Make a list of emergency phone numbers, including numbers for family, friends, the hospital, and police and fire departments. General instructions  Avoid cat litter boxes and soil used by cats. These carry germs that can cause birth defects in the baby. If you have a cat, ask someone to clean the litter box for you.  Do not travel far distances unless it is absolutely necessary and only with the approval of your health care provider.  Do not use hot tubs, steam rooms, or saunas.  Do not drink alcohol.  Do not use any products that contain nicotine or tobacco, such as cigarettes and e-cigarettes. If you need help quitting, ask your health care provider.  Do not use any medicinal herbs or unprescribed drugs. These chemicals affect the formation and growth of the baby.  Do not douche or use tampons or scented sanitary pads.  Do not cross your legs for long periods of time.  To prepare for the arrival of your baby: ? Take prenatal classes to understand, practice, and ask questions about labor and delivery. ? Make a trial run to the hospital. ? Visit the hospital and tour the maternity area. ? Arrange for maternity or paternity leave through employers. ? Arrange for family and friends to take care of pets while you are in the hospital. ? Purchase a rear-facing car seat and make sure you know how to install it in your car. ? Pack your hospital bag. ? Prepare the baby's nursery. Make sure to remove all pillows and stuffed animals from the baby's crib to prevent suffocation.  Visit your dentist if you have not gone during your pregnancy. Use a soft toothbrush to brush your teeth and be gentle when you floss. Contact a health care provider if:  You are unsure if you are in labor or if your water has broken.  You become dizzy.  You have mild pelvic cramps, pelvic pressure, or nagging pain in your abdominal area.  You have lower back pain.  You have persistent  nausea, vomiting, or diarrhea.  You have an unusual or bad smelling vaginal discharge.  You have pain when you urinate. Get help right away if:  Your water breaks before 37 weeks.  You have regular contractions less than 5 minutes apart before 37 weeks.  You have a fever.  You are leaking fluid from your vagina.  You have spotting or bleeding from your vagina.  You have severe abdominal pain or cramping.  You have rapid weight loss or weight gain.  You have shortness of breath with chest pain.  You notice sudden or extreme swelling of your face, hands, ankles, feet, or legs.  Your baby makes fewer than 10 movements in 2 hours.  You have severe headaches that do not go away when you take medicine.  You have vision changes. Summary  The third trimester is from week 28 through week 40, months 7 through 9. The third trimester is a time when the unborn baby (fetus) is growing rapidly.  During the third trimester, your discomfort may increase as you and your baby continue to gain weight. You may have abdominal, leg, and back pain, sleeping problems, and an increased need to urinate.  During the third trimester your breasts will keep growing and they will continue to become tender. A yellow fluid (colostrum) may leak from your breasts. This is the first milk you are producing for your baby.  False labor is a condition in which you feel small, irregular tightenings of the muscles in the womb (contractions) that eventually go away. These are called Braxton Hicks contractions. Contractions may last for hours, days, or even weeks before true labor sets in.  Signs of labor can include: abdominal cramps; regular contractions that start at 10 minutes apart and become stronger and more frequent with time; watery or bloody mucus discharge that comes from the vagina; increased pelvic pressure and dull back pain; and leaking of amniotic fluid. This information is not intended to replace advice  given to you by your health care provider. Make sure you discuss any questions you have with your health care provider. Document Released: 12/06/2001 Document Revised: 05/19/2016 Document Reviewed: 02/12/2013 Elsevier Interactive Patient Education  2017 [employer].

## 2018-09-03 ENCOUNTER — Ambulatory Visit (HOSPITAL_COMMUNITY)
Admission: RE | Admit: 2018-09-03 | Discharge: 2018-09-03 | Disposition: A | Discharge status: Home / Self Care | Payer: Medicaid Other | Source: Ambulatory Visit | Attending: Obstetrics & Gynecology | Admitting: Obstetrics & Gynecology

## 2018-09-03 ENCOUNTER — Encounter (HOSPITAL_COMMUNITY): Payer: Self-pay

## 2018-09-03 DIAGNOSIS — O24410 Gestational diabetes mellitus in pregnancy, diet controlled: Secondary | ICD-10-CM | POA: Diagnosis not present

## 2018-09-03 DIAGNOSIS — Z3A35 35 weeks gestation of pregnancy: Secondary | ICD-10-CM | POA: Diagnosis not present

## 2018-09-03 DIAGNOSIS — O10919 Unspecified pre-existing hypertension complicating pregnancy, unspecified trimester: Secondary | ICD-10-CM

## 2018-09-03 DIAGNOSIS — O0990 Supervision of high risk pregnancy, unspecified, unspecified trimester: Secondary | ICD-10-CM

## 2018-09-03 DIAGNOSIS — O10013 Pre-existing essential hypertension complicating pregnancy, third trimester: Secondary | ICD-10-CM | POA: Insufficient documentation

## 2018-09-03 DIAGNOSIS — O359XX0 Maternal care for (suspected) fetal abnormality and damage, unspecified, not applicable or unspecified: Secondary | ICD-10-CM

## 2018-09-04 ENCOUNTER — Other Ambulatory Visit (HOSPITAL_COMMUNITY): Payer: Self-pay | Admitting: *Deleted

## 2018-09-04 DIAGNOSIS — O10913 Unspecified pre-existing hypertension complicating pregnancy, third trimester: Secondary | ICD-10-CM

## 2018-09-07 ENCOUNTER — Ambulatory Visit (INDEPENDENT_AMBULATORY_CARE_PROVIDER_SITE_OTHER): Payer: Medicaid Other | Admitting: Obstetrics & Gynecology

## 2018-09-07 ENCOUNTER — Other Ambulatory Visit (HOSPITAL_COMMUNITY)
Admission: RE | Admit: 2018-09-07 | Discharge: 2018-09-07 | Disposition: A | Discharge status: Home / Self Care | Payer: Medicaid Other | Source: Ambulatory Visit | Attending: Obstetrics & Gynecology | Admitting: Obstetrics & Gynecology

## 2018-09-07 VITALS — BP 132/86 | HR 87 | Wt 167.8 lb

## 2018-09-07 DIAGNOSIS — O10913 Unspecified pre-existing hypertension complicating pregnancy, third trimester: Secondary | ICD-10-CM

## 2018-09-07 DIAGNOSIS — O24410 Gestational diabetes mellitus in pregnancy, diet controlled: Secondary | ICD-10-CM

## 2018-09-07 DIAGNOSIS — Z3009 Encounter for other general counseling and advice on contraception: Secondary | ICD-10-CM

## 2018-09-07 DIAGNOSIS — O0990 Supervision of high risk pregnancy, unspecified, unspecified trimester: Secondary | ICD-10-CM

## 2018-09-07 DIAGNOSIS — O0993 Supervision of high risk pregnancy, unspecified, third trimester: Secondary | ICD-10-CM

## 2018-09-07 DIAGNOSIS — Z3A00 Weeks of gestation of pregnancy not specified: Secondary | ICD-10-CM | POA: Diagnosis not present

## 2018-09-07 LAB — OB RESULTS CONSOLE GBS: GBS: NEGATIVE

## 2018-09-07 LAB — OB RESULTS CONSOLE GC/CHLAMYDIA: Gonorrhea: NEGATIVE

## 2018-09-07 NOTE — Progress Notes (Signed)
   PRENATAL VISIT NOTE  Subjective:  Shelly Adams is a 27 y.o. G3P2002 at 63w5dbeing seen today for ongoing prenatal care.  She is currently monitored for the following issues for this high-risk pregnancy and has Asthma; Steatosis of liver suggested by ultrasound; Supervision of high risk pregnancy, antepartum; Maternal chronic hypertension, third trimester; GDM (gestational diabetes mellitus); and Unwanted fertility on their problem list.  Patient reports no complaints.  Contractions: Not present. Vag. Bleeding: None.  Movement: Present. Denies leaking of fluid.   The following portions of the patient's history were reviewed and updated as appropriate: allergies, current medications, past family history, past medical history, past social history, past surgical history and problem list. Problem list updated.  Objective:   Vitals:   09/07/18 0942  BP: 132/86  Pulse: 87  Weight: 167 lb 12.8 oz (76.1 kg)    Fetal Status: Fetal Heart Rate (bpm): 135   Movement: Present     General:  Alert, oriented and cooperative. Patient is in no acute distress.  Skin: Skin is warm and dry. No rash noted.   Cardiovascular: Normal heart rate noted  Respiratory: Normal respiratory effort, no problems with respiration noted  Abdomen: Soft, gravid, appropriate for gestational age.  Pain/Pressure: Present     Pelvic: Cervical exam performed        Extremities: Normal range of motion.  Edema: None  Mental Status: Normal mood and affect. Normal behavior. Normal judgment and thought content.   Assessment and Plan:  Pregnancy: G3P2002 at 343w5d1. Unwanted fertility - plans BTL  2. Supervision of high risk pregnancy, antepartum  - Culture, beta strep (group b only) - Cervicovaginal ancillary only  3. Maternal chronic hypertension, third trimester   4. Diet controlled gestational diabetes mellitus (GDM), antepartum - she is not checking her sugars much, didn't bring them. - ha1c today  Preterm  labor symptoms and general obstetric precautions including but not limited to vaginal bleeding, contractions, leaking of fluid and fetal movement were reviewed in detail with the patient. Please refer to After Visit Summary for other counseling recommendations.  No follow-ups on file.  Future Appointments  Date Time Provider [place]9/16/2019 12:30 PM [facility] [place] [facility]  09/17/2018  1:30 PM [facility] [place] [facility]  09/24/2018  2:00 PM [facility] [place] [facility]  10/01/2018  1:00 PM [facility] [place] [facility]    MyEmily FilbertMD

## 2018-09-08 LAB — HEMOGLOBIN A1C
Est. average glucose Bld gHb Est-mCnc: 111 mg/dL
Hgb A1c MFr Bld: 5.5 % (ref 4.8–5.6)

## 2018-09-10 ENCOUNTER — Ambulatory Visit (HOSPITAL_COMMUNITY): Admission: RE | Admit: 2018-09-10 | Payer: Medicaid Other | Source: Ambulatory Visit

## 2018-09-10 LAB — CERVICOVAGINAL ANCILLARY ONLY
CHLAMYDIA, DNA PROBE: NEGATIVE
NEISSERIA GONORRHEA: NEGATIVE

## 2018-09-11 LAB — CULTURE, BETA STREP (GROUP B ONLY): STREP GP B CULTURE: NEGATIVE

## 2018-09-17 ENCOUNTER — Ambulatory Visit (HOSPITAL_COMMUNITY)
Admission: RE | Admit: 2018-09-17 | Discharge: 2018-09-17 | Disposition: A | Discharge status: Home / Self Care | Payer: Medicaid Other | Source: Ambulatory Visit | Attending: Obstetrics & Gynecology | Admitting: Obstetrics & Gynecology

## 2018-09-17 ENCOUNTER — Encounter (HOSPITAL_COMMUNITY): Payer: Self-pay

## 2018-09-17 ENCOUNTER — Ambulatory Visit (INDEPENDENT_AMBULATORY_CARE_PROVIDER_SITE_OTHER): Payer: Medicaid Other | Admitting: Obstetrics & Gynecology

## 2018-09-17 VITALS — BP 133/84 | HR 87 | Wt 173.2 lb

## 2018-09-17 DIAGNOSIS — Z3A37 37 weeks gestation of pregnancy: Secondary | ICD-10-CM | POA: Insufficient documentation

## 2018-09-17 DIAGNOSIS — O0993 Supervision of high risk pregnancy, unspecified, third trimester: Secondary | ICD-10-CM | POA: Diagnosis not present

## 2018-09-17 DIAGNOSIS — O10913 Unspecified pre-existing hypertension complicating pregnancy, third trimester: Secondary | ICD-10-CM | POA: Diagnosis not present

## 2018-09-17 DIAGNOSIS — O0990 Supervision of high risk pregnancy, unspecified, unspecified trimester: Secondary | ICD-10-CM

## 2018-09-17 DIAGNOSIS — R51 Headache: Secondary | ICD-10-CM | POA: Diagnosis not present

## 2018-09-17 DIAGNOSIS — O24410 Gestational diabetes mellitus in pregnancy, diet controlled: Secondary | ICD-10-CM | POA: Diagnosis not present

## 2018-09-17 DIAGNOSIS — O10013 Pre-existing essential hypertension complicating pregnancy, third trimester: Secondary | ICD-10-CM | POA: Diagnosis present

## 2018-09-17 NOTE — Addendum Note (Signed)
Addended by: Langston Reusing on: 09/17/2018 10:18 AM   Modules accepted: Orders

## 2018-09-17 NOTE — Progress Notes (Signed)
   PRENATAL VISIT NOTE  Subjective:  Shelly Adams is a 27 y.o. G3P2002 at 38w1dbeing seen today for ongoing prenatal care.  She is currently monitored for the following issues for this high-risk pregnancy and has Asthma; Steatosis of liver suggested by ultrasound; Supervision of high risk pregnancy, antepartum; Maternal chronic hypertension, third trimester; GDM (gestational diabetes mellitus); and Unwanted fertility on their problem list.  Patient reports no complaints.  Contractions: Irregular. Vag. Bleeding: None.  Movement: Present. Denies leaking of fluid.   The following portions of the patient's history were reviewed and updated as appropriate: allergies, current medications, past family history, past medical history, past social history, past surgical history and problem list. Problem list updated.  Objective:   Vitals:   09/17/18 0938  BP: 133/84  Pulse: 87  Weight: 173 lb 3.2 oz (78.6 kg)    Fetal Status: Fetal Heart Rate (bpm): 138 Fundal Height: 37 cm Movement: Present     General:  Alert, oriented and cooperative. Patient is in no acute distress.  Skin: Skin is warm and dry. No rash noted.   Cardiovascular: Normal heart rate noted  Respiratory: Normal respiratory effort, no problems with respiration noted  Abdomen: Soft, gravid, appropriate for gestational age.  Pain/Pressure: Present     Pelvic: Cervical exam deferred        Extremities: Normal range of motion.  Edema: Trace  Mental Status: Normal mood and affect. Normal behavior. Normal judgment and thought content.   Assessment and Plan:  Pregnancy: G3P2002 at 314w1d1. Diet controlled gestational diabetes mellitus (GDM), antepartum - she did not bring her sugars  2. Supervision of high risk pregnancy, antepartum   3. Maternal chronic hypertension, third trimester - doing well on no meds - weekly BPPs  Preterm labor symptoms and general obstetric precautions including but not limited to vaginal bleeding,  contractions, leaking of fluid and fetal movement were reviewed in detail with the patient. Please refer to After Visit Summary for other counseling recommendations.  No follow-ups on file.  Future Appointments  Date Time Provider [place]/23/2019  1:30 PM [facility] [place] [facility]  09/24/2018  2:00 PM [facility] [place] [facility]  10/01/2018  1:00 PM [facility] [place] [facility]    MyEmily FilbertMD

## 2018-09-24 ENCOUNTER — Encounter (HOSPITAL_COMMUNITY): Payer: Self-pay

## 2018-09-24 ENCOUNTER — Ambulatory Visit (HOSPITAL_COMMUNITY)
Admission: RE | Admit: 2018-09-24 | Discharge: 2018-09-24 | Disposition: A | Discharge status: Home / Self Care | Payer: Medicaid Other | Source: Ambulatory Visit | Attending: Obstetrics & Gynecology | Admitting: Obstetrics & Gynecology

## 2018-09-24 DIAGNOSIS — O0990 Supervision of high risk pregnancy, unspecified, unspecified trimester: Secondary | ICD-10-CM

## 2018-09-24 DIAGNOSIS — O10013 Pre-existing essential hypertension complicating pregnancy, third trimester: Secondary | ICD-10-CM | POA: Insufficient documentation

## 2018-09-24 DIAGNOSIS — O10913 Unspecified pre-existing hypertension complicating pregnancy, third trimester: Secondary | ICD-10-CM

## 2018-09-24 DIAGNOSIS — Z3A38 38 weeks gestation of pregnancy: Secondary | ICD-10-CM

## 2018-09-24 DIAGNOSIS — O24410 Gestational diabetes mellitus in pregnancy, diet controlled: Secondary | ICD-10-CM | POA: Insufficient documentation

## 2018-09-26 ENCOUNTER — Ambulatory Visit: Payer: Medicaid Other | Admitting: *Deleted

## 2018-09-26 ENCOUNTER — Inpatient Hospital Stay (HOSPITAL_COMMUNITY): Payer: Medicaid Other | Admitting: Anesthesiology

## 2018-09-26 ENCOUNTER — Other Ambulatory Visit: Payer: Self-pay

## 2018-09-26 ENCOUNTER — Inpatient Hospital Stay (HOSPITAL_COMMUNITY)
Admission: AD | Admit: 2018-09-26 | Discharge: 2018-09-29 | DRG: 807 | Disposition: A | Discharge status: Home / Self Care | Payer: Medicaid Other | Attending: Family Medicine | Admitting: Family Medicine

## 2018-09-26 ENCOUNTER — Encounter (HOSPITAL_COMMUNITY): Payer: Self-pay | Admitting: *Deleted

## 2018-09-26 ENCOUNTER — Ambulatory Visit: Payer: Medicaid Other | Admitting: Obstetrics & Gynecology

## 2018-09-26 VITALS — BP 152/96 | HR 78 | Wt 172.0 lb

## 2018-09-26 DIAGNOSIS — O24420 Gestational diabetes mellitus in childbirth, diet controlled: Secondary | ICD-10-CM | POA: Diagnosis present

## 2018-09-26 DIAGNOSIS — O10913 Unspecified pre-existing hypertension complicating pregnancy, third trimester: Secondary | ICD-10-CM

## 2018-09-26 DIAGNOSIS — O114 Pre-existing hypertension with pre-eclampsia, complicating childbirth: Principal | ICD-10-CM | POA: Diagnosis present

## 2018-09-26 DIAGNOSIS — O0990 Supervision of high risk pregnancy, unspecified, unspecified trimester: Secondary | ICD-10-CM

## 2018-09-26 DIAGNOSIS — O99824 Streptococcus B carrier state complicating childbirth: Secondary | ICD-10-CM | POA: Diagnosis present

## 2018-09-26 DIAGNOSIS — O1092 Unspecified pre-existing hypertension complicating childbirth: Secondary | ICD-10-CM | POA: Diagnosis present

## 2018-09-26 DIAGNOSIS — O24419 Gestational diabetes mellitus in pregnancy, unspecified control: Secondary | ICD-10-CM | POA: Diagnosis present

## 2018-09-26 DIAGNOSIS — O1410 Severe pre-eclampsia, unspecified trimester: Secondary | ICD-10-CM

## 2018-09-26 DIAGNOSIS — O24410 Gestational diabetes mellitus in pregnancy, diet controlled: Secondary | ICD-10-CM

## 2018-09-26 DIAGNOSIS — Z3A38 38 weeks gestation of pregnancy: Secondary | ICD-10-CM | POA: Diagnosis not present

## 2018-09-26 DIAGNOSIS — J45909 Unspecified asthma, uncomplicated: Secondary | ICD-10-CM | POA: Diagnosis present

## 2018-09-26 DIAGNOSIS — O1002 Pre-existing essential hypertension complicating childbirth: Secondary | ICD-10-CM | POA: Diagnosis present

## 2018-09-26 HISTORY — DX: Unspecified asthma, uncomplicated: J45.909

## 2018-09-26 HISTORY — DX: Unspecified pre-existing hypertension complicating childbirth: O10.92

## 2018-09-26 LAB — COMPREHENSIVE METABOLIC PANEL
ALBUMIN: 3.1 g/dL — AB (ref 3.5–5.0)
ALT: 18 U/L (ref 0–44)
ANION GAP: 9 (ref 5–15)
AST: 22 U/L (ref 15–41)
Alkaline Phosphatase: 189 U/L — ABNORMAL HIGH (ref 38–126)
BUN: 8 mg/dL (ref 6–20)
CO2: 21 mmol/L — AB (ref 22–32)
Calcium: 9 mg/dL (ref 8.9–10.3)
Chloride: 104 mmol/L (ref 98–111)
Creatinine, Ser: 0.62 mg/dL (ref 0.44–1.00)
GFR calc Af Amer: >60 mL/min (ref >60)
GFR calc non Af Amer: >60 mL/min (ref >60)
GLUCOSE: 82 mg/dL (ref 70–99)
Potassium: 3.9 mmol/L (ref 3.5–5.1)
SODIUM: 134 mmol/L — AB (ref 135–145)
Total Bilirubin: 0.5 mg/dL (ref 0.3–1.2)
Total Protein: 6.8 g/dL (ref 6.5–8.1)

## 2018-09-26 LAB — CBC WITH DIFFERENTIAL/PLATELET
BASOS ABS: 0 10*3/uL (ref 0.0–0.1)
BASOS PCT: 0 %
EOS ABS: 0.1 10*3/uL (ref 0.0–0.7)
Eosinophils Relative: 1 %
HCT: 36 % (ref 36.0–46.0)
HEMOGLOBIN: 11.7 g/dL — AB (ref 12.0–15.0)
Lymphocytes Relative: 22 %
Lymphs Abs: 1.6 10*3/uL (ref 0.7–4.0)
MCH: 26.8 pg (ref 26.0–34.0)
MCHC: 32.5 g/dL (ref 30.0–36.0)
MCV: 82.4 fL (ref 78.0–100.0)
MONO ABS: 0.4 10*3/uL (ref 0.1–1.0)
MONOS PCT: 6 %
NEUTROS PCT: 71 %
Neutro Abs: 5 10*3/uL (ref 1.7–7.7)
Platelets: 177 10*3/uL (ref 150–400)
RBC: 4.37 MIL/uL (ref 3.87–5.11)
RDW: 14.3 % (ref 11.5–15.5)
WBC: 7.1 10*3/uL (ref 4.0–10.5)

## 2018-09-26 LAB — TYPE AND SCREEN
ABO/RH(D): O POS
Antibody Screen: NEGATIVE

## 2018-09-26 LAB — GLUCOSE, CAPILLARY
GLUCOSE-CAPILLARY: 76 mg/dL (ref 70–99)
Glucose-Capillary: 106 mg/dL — ABNORMAL HIGH (ref 70–99)

## 2018-09-26 LAB — CBC
HCT: 36.2 % (ref 36.0–46.0)
Hemoglobin: 11.8 g/dL — ABNORMAL LOW (ref 12.0–15.0)
MCH: 26.9 pg (ref 26.0–34.0)
MCHC: 32.6 g/dL (ref 30.0–36.0)
MCV: 82.6 fL (ref 78.0–100.0)
PLATELETS: 197 10*3/uL (ref 150–400)
RBC: 4.38 MIL/uL (ref 3.87–5.11)
RDW: 14.3 % (ref 11.5–15.5)
WBC: 10.1 10*3/uL (ref 4.0–10.5)

## 2018-09-26 LAB — PROTEIN / CREATININE RATIO, URINE
Creatinine, Urine: 30 mg/dL
PROTEIN CREATININE RATIO: 0.3 mg/mg{creat} — AB (ref 0.00–0.15)
Total Protein, Urine: 9 mg/dL

## 2018-09-26 MED ORDER — MAGNESIUM SULFATE 40 G IN LACTATED RINGERS - SIMPLE
2.0000 g/h | INTRAVENOUS | Status: DC
  Administered 2018-09-26: 2 g/h via INTRAVENOUS
  Filled 2018-09-26 (×2): qty 500

## 2018-09-26 MED ORDER — DIPHENHYDRAMINE HCL 50 MG/ML IJ SOLN: 12.5000 mg | INTRAMUSCULAR | Status: DC | PRN

## 2018-09-26 MED ORDER — PHENYLEPHRINE 40 MCG/ML (10ML) SYRINGE FOR IV PUSH (FOR BLOOD PRESSURE SUPPORT)
80.0000 ug | PREFILLED_SYRINGE | INTRAVENOUS | Status: DC | PRN
  Filled 2018-09-26: qty 5

## 2018-09-26 MED ORDER — TERBUTALINE SULFATE 1 MG/ML IJ SOLN: 0.2500 mg | Freq: Once | INTRAMUSCULAR | Status: DC | PRN

## 2018-09-26 MED ORDER — ONDANSETRON HCL 4 MG/2ML IJ SOLN: 4.0000 mg | Freq: Four times a day (QID) | INTRAMUSCULAR | Status: DC | PRN

## 2018-09-26 MED ORDER — PHENYLEPHRINE 40 MCG/ML (10ML) SYRINGE FOR IV PUSH (FOR BLOOD PRESSURE SUPPORT)
80.0000 ug | PREFILLED_SYRINGE | INTRAVENOUS | Status: DC | PRN
  Filled 2018-09-26: qty 10
  Filled 2018-09-26: qty 5

## 2018-09-26 MED ORDER — OXYTOCIN 40 UNITS IN LACTATED RINGERS INFUSION - SIMPLE MED
1.0000 m[IU]/min | INTRAVENOUS | Status: DC
  Administered 2018-09-26: 2 m[IU]/min via INTRAVENOUS
  Filled 2018-09-26: qty 1000

## 2018-09-26 MED ORDER — ACETAMINOPHEN 500 MG PO TABS
1000.0000 mg | ORAL_TABLET | Freq: Once | ORAL | Status: AC
  Administered 2018-09-26: 500 mg via ORAL
  Filled 2018-09-26: qty 2

## 2018-09-26 MED ORDER — FENTANYL CITRATE (PF) 100 MCG/2ML IJ SOLN: 50.0000 ug | INTRAMUSCULAR | Status: DC | PRN

## 2018-09-26 MED ORDER — ACETAMINOPHEN 325 MG PO TABS
650.0000 mg | ORAL_TABLET | ORAL | Status: DC | PRN
  Administered 2018-09-27: 650 mg via ORAL
  Filled 2018-09-26: qty 2

## 2018-09-26 MED ORDER — MAGNESIUM SULFATE BOLUS VIA INFUSION
4.0000 g | Freq: Once | INTRAVENOUS | Status: AC
  Administered 2018-09-26: 4 g via INTRAVENOUS
  Filled 2018-09-26: qty 500

## 2018-09-26 MED ORDER — LIDOCAINE HCL (PF) 1 % IJ SOLN
INTRAMUSCULAR | Status: DC | PRN
  Administered 2018-09-26: 13 mL via EPIDURAL

## 2018-09-26 MED ORDER — OXYCODONE HCL 5 MG PO TABS
10.0000 mg | ORAL_TABLET | Freq: Once | ORAL | Status: AC
  Administered 2018-09-26: 10 mg via ORAL
  Filled 2018-09-26: qty 2

## 2018-09-26 MED ORDER — LIDOCAINE HCL (PF) 1 % IJ SOLN
30.0000 mL | INTRAMUSCULAR | Status: DC | PRN
  Filled 2018-09-26: qty 30

## 2018-09-26 MED ORDER — LACTATED RINGERS IV SOLN
INTRAVENOUS | Status: DC
  Administered 2018-09-26 – 2018-09-27 (×2): via INTRAVENOUS

## 2018-09-26 MED ORDER — EPHEDRINE 5 MG/ML INJ
10.0000 mg | INTRAVENOUS | Status: DC | PRN
  Filled 2018-09-26: qty 2

## 2018-09-26 MED ORDER — LACTATED RINGERS IV SOLN
500.0000 mL | INTRAVENOUS | Status: DC | PRN
  Administered 2018-09-27: 500 mL via INTRAVENOUS

## 2018-09-26 MED ORDER — LACTATED RINGERS IV SOLN
500.0000 mL | Freq: Once | INTRAVENOUS | Status: AC
  Administered 2018-09-26: 500 mL via INTRAVENOUS

## 2018-09-26 MED ORDER — FENTANYL 2.5 MCG/ML BUPIVACAINE 1/10 % EPIDURAL INFUSION (WH - ANES)
14.0000 mL/h | INTRAMUSCULAR | Status: DC | PRN
  Administered 2018-09-26: 14 mL/h via EPIDURAL
  Filled 2018-09-26: qty 100

## 2018-09-26 MED ORDER — OXYTOCIN 40 UNITS IN LACTATED RINGERS INFUSION - SIMPLE MED: 2.5000 [IU]/h | INTRAVENOUS | Status: DC

## 2018-09-26 MED ORDER — OXYTOCIN BOLUS FROM INFUSION
500.0000 mL | Freq: Once | INTRAVENOUS | Status: AC
  Administered 2018-09-27: 500 mL via INTRAVENOUS

## 2018-09-26 MED ORDER — MISOPROSTOL 50MCG HALF TABLET
50.0000 ug | ORAL_TABLET | ORAL | Status: DC | PRN
  Administered 2018-09-26: 50 ug via BUCCAL
  Filled 2018-09-26 (×2): qty 1

## 2018-09-26 MED ORDER — SOD CITRATE-CITRIC ACID 500-334 MG/5ML PO SOLN: 30.0000 mL | ORAL | Status: DC | PRN

## 2018-09-26 NOTE — Anesthesia Pain Management Evaluation Note (Signed)
  [facility] Pain Management Visit Note  Patient: Shelly Adams, 27 y.o., female  "Hello I am a member of the anesthesia team at [facility]. We have an anesthesia team available at all times to provide care throughout the hospital, including epidural management and anesthesia for C-section. I don't know your plan for the delivery whether it a natural birth, water birth, IV sedation, nitrous supplementation, doula or epidural, but we want to meet your pain goals."   1.Was your pain managed to your expectations on prior hospitalizations?   Yes   2.What is your expectation for pain management during this hospitalization?     Epidural  3.How can we help you reach that goal? epidural  Record the patient's initial score and the patient's pain goal.   Pain: 4  Pain Goal: 2 [facility] wants you to be able to say your pain was always managed very well.  Jarissa Sheriff 09/26/2018

## 2018-09-26 NOTE — MAU Provider Note (Signed)
History     CSN: [id number]  Arrival date and time: 09/26/18 1213   First Provider Initiated Contact with Patient 09/26/18 1242      Chief Complaint  Patient presents with  . Headache  . Hypertension   Shelly Adams is a 27 y.o. G3P2002 at 85w3dwith CHTN no meds and GDM diet controlled. She was seen for ROB today with elevated blood pressure. Prior to today BP has been 120-130/70-80. She reports headache and visual disturbances today. She denies LOF or VB. She has had some contractions. Normal fetal movement.    OB History    Gravida  3   Para  2   Term  2   Preterm      AB      Living  2     SAB      TAB      Ectopic      Multiple  0   Live Births  2           Past Medical History:  Diagnosis Date  . Anxiety   . Benign essential hypertension, antepartum 07/29/2013   On propranolol-->labetalol [x]  Aspirin 81 mg daily after 12 weeks; discontinue after 36 weeks Current antihypertensives:  Labetalol   Baseline and surveillance labs [facility] links as needed)  Lab Results Component Value Date  PLT 198 10/17/2016  CREATININE 0.82 07/05/2016  AST 20 07/05/2016  ALT 25 07/05/2016  PROTCRRATIO 1.11 (H) 08/29/2013  PROTEIN24HR 231 (H) 08/23/2013 Plt 0.52 Pr:Cr 108  Antenatal Testing CHTN - O10.919  Group I  BP < 140/90, no preeclampsia, AGA,  nml AFV, +/- meds    Group II BP > 140/90, on meds, no preeclampsia, AGA, nml AFV  20-28-34-38  20-24-28-32-35-38  32//2 x wk  28//BPP wkly then 32//2 x wk  40 no meds; 39 meds  PRN or 37 Pre-eclampsia  GHTN - O13.9/Preeclampsia without severe features  - O14.00   Preeclampsia with severe features - O14.10  Q 3-4wks  Q 2 wks  28//BPP wkly then 32//2 x wk  Inpatient  37  PRN or 34    . Fatty liver disease, nonalcoholic   . 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 GBS (group B Streptococcus carrier), +RV culture, currently pregnant 03/05/2017  . GDM (gestational diabetes mellitus) 07/23/2018  . Headache   . Hyperlipemia   .  Insomnia   . Normal delivery 2014   And 2018  . Recurrent kidney stones    history of kidney stones  . Steatosis of liver suggested by ultrasound 10/31/2016    Past Surgical History:  Procedure Laterality Date  . BREAST REDUCTION SURGERY  2009    Family History  Problem Relation Age of Onset  . Hypertension Mother   . Hyperlipidemia Mother   . GER disease Mother   . Hyperlipidemia Father   . Hypertension Father   . Colon cancer Neg Hx     Social History   Tobacco Use  . Smoking status: Never Smoker  . Smokeless tobacco: Never Used  Substance Use Topics  . Alcohol use: No  . Drug use: No    Allergies:  Allergies  Allergen Reactions  . Metoprolol Shortness Of Breath  . Clonidine Other (See Comments)    fatigue fatigue   . Tramadol Itching    Medications Prior to Admission  Medication Sig Dispense Refill Last Dose  . ACCU-CHEK FASTCLIX LANCETS MISC 1 each by Percutaneous route 4 (four) times daily. 100 each 12  Taking  . aspirin EC 81 MG tablet Take 1 tablet (81 mg total) by mouth daily. Take after 12 weeks for prevention of preeclampsia later in pregnancy (Patient not taking: Reported on 09/24/2018) 300 tablet 2 Not Taking  . glucose blood test strip Check blood sugar four times per day. 100 each 12 Taking  . Prenatal Vit-Fe Fumarate-FA (PRENATAL MULTIVITAMIN) TABS tablet Take 1 tablet by mouth daily at 12 noon.   Taking    Review of Systems Physical Exam   Blood pressure (!) 132/102, pulse 79, last menstrual period 12/21/2017, SpO2 100 %, unknown if currently breastfeeding.  Physical Exam  Nursing note and vitals reviewed. Constitutional: She is oriented to person, place, and time. She appears well-developed and well-nourished. No distress.  HENT:  Head: Normocephalic.  Cardiovascular: Normal rate.  Respiratory: Effort normal.  GI: Soft. There is no tenderness. There is no rebound.  Neurological: She is alert and oriented to person, place, and time.   Skin: Skin is warm and dry.  Psychiatric: She has a normal mood and affect.   NST:  Baseline: 125 Variability: moderate Accels: 15x15 Decels: none Toco: rare   Results for orders placed or performed during the hospital encounter of 09/26/18 (from the past 24 hour(s))  Protein / creatinine ratio, urine     Status: Abnormal   Collection Time: 09/26/18 12:46 PM  Result Value Ref Range   Creatinine, Urine 30.00 mg/dL   Total Protein, Urine 9 mg/dL   Protein Creatinine Ratio 0.30 (H) 0.00 - 0.15 mg/mg[Cre]  CBC with Differential/Platelet     Status: Abnormal   Collection Time: 09/26/18 12:53 PM  Result Value Ref Range   WBC 7.1 4.0 - 10.5 K/uL   RBC 4.37 3.87 - 5.11 MIL/uL   Hemoglobin 11.7 (L) 12.0 - 15.0 g/dL   HCT 36.0 36.0 - 46.0 %   MCV 82.4 78.0 - 100.0 fL   MCH 26.8 26.0 - 34.0 pg   MCHC 32.5 30.0 - 36.0 g/dL   RDW 14.3 11.5 - 15.5 %   Platelets 177 150 - 400 K/uL   Neutrophils Relative % 71 %   Neutro Abs 5.0 1.7 - 7.7 K/uL   Lymphocytes Relative 22 %   Lymphs Abs 1.6 0.7 - 4.0 K/uL   Monocytes Relative 6 %   Monocytes Absolute 0.4 0.1 - 1.0 K/uL   Eosinophils Relative 1 %   Eosinophils Absolute 0.1 0.0 - 0.7 K/uL   Basophils Relative 0 %   Basophils Absolute 0.0 0.0 - 0.1 K/uL  Comprehensive metabolic panel     Status: Abnormal   Collection Time: 09/26/18 12:53 PM  Result Value Ref Range   Sodium 134 (L) 135 - 145 mmol/L   Potassium 3.9 3.5 - 5.1 mmol/L   Chloride 104 98 - 111 mmol/L   CO2 21 (L) 22 - 32 mmol/L   Glucose, Bld 82 70 - 99 mg/dL   BUN 8 6 - 20 mg/dL   Creatinine, Ser 0.62 0.44 - 1.00 mg/dL   Calcium 9.0 8.9 - 10.3 mg/dL   Total Protein 6.8 6.5 - 8.1 g/dL   Albumin 3.1 (L) 3.5 - 5.0 g/dL   AST 22 15 - 41 U/L   ALT 18 0 - 44 U/L   Alkaline Phosphatase 189 (H) 38 - 126 U/L   Total Bilirubin 0.5 0.3 - 1.2 mg/dL   GFR calc non Af Amer >60 >60 mL/min   GFR calc Af Amer >60 >60 mL/min   Anion gap  9 5 - 15   MAU Course   Procedures  MDM Headache, mildly elevated blood pressure, proteinuria Headache is severe feature will admit for magnesium and IOL   Assessment and Plan   1. Supervision of high risk pregnancy, antepartum    Admit to labor and delivery IOL Magnesium for seizure prophylaxis   Marcille Buffy 09/26/2018, 12:44 PM

## 2018-09-26 NOTE — MAU Note (Signed)
Pt sent up from clinic with elevated b/p and headache.

## 2018-09-26 NOTE — Anesthesia Procedure Notes (Signed)
Epidural Patient location during procedure: OB Start time: 09/26/2018 11:16 PM End time: 09/26/2018 11:35 PM  Staffing Anesthesiologist: Lynda Rainwater, MD Performed: anesthesiologist   Preanesthetic Checklist Completed: patient identified, site marked, surgical consent, pre-op evaluation, timeout performed, IV checked, risks and benefits discussed and monitors and equipment checked  Epidural Patient position: sitting Prep: ChloraPrep Patient monitoring: heart rate, cardiac monitor, continuous pulse ox and blood pressure Approach: midline Location: L2-L3 Injection technique: LOR saline  Needle:  Needle type: Tuohy  Needle gauge: 17 G Needle length: 9 cm Needle insertion depth: 6 cm Catheter type: closed end flexible Catheter size: 20 Guage Catheter at skin depth: 10 cm Test dose: negative  Assessment Events: blood not aspirated, injection not painful, no injection resistance, negative IV test and no paresthesia  Additional Notes Reason for block:procedure for pain

## 2018-09-26 NOTE — H&P (Addendum)
LABOR AND DELIVERY ADMISSION HISTORY AND PHYSICAL NOTE  Shelly Adams is a 27 y.o. female G3P2002 with IUP at 20w3dby LMP presenting for IOL for chronic hypertension with superimposed preeclampsia. Went to NST today at the clinic, reported having migraines and blurry vision intermittently for the last week, worst today. Some associated dizziness/lightheadedness. Denies any significant SOB, CP, or abdominal pain. Sent to the MAU for further evaluation. Not relieved with tylenol. She has been checking her BP at home, ranging around 1007'Msystolic, >>22'Qdiastolic at home. Has not been taking medication for BP this pregnancy. Reports feeling some cramping with contractions.   She reports positive fetal movement. She denies leakage of fluid or vaginal bleeding.  Prenatal History/Complications[facility]    Pregnancy complications: History of pre-eclampsia in previous pregnancy, chronic hypertension (on no controlling medication), gestational diabetes (diet controlled), NASH (by ultrasound), Asthma (has albuterol inhaler at home, hasn't needed in years).     U/S 09/24/2018: no fetal abnormalities, normal growth   Past Medical History: Past Medical History:  Diagnosis Date  . Anxiety   . Benign essential hypertension, antepartum 07/29/2013   On propranolol-->labetalol [x]  Aspirin 81 mg daily after 12 weeks; discontinue after 36 weeks Current antihypertensives:  Labetalol   [facility] and [facility]  Lab Results Component Value Date  PLT 198 10/17/2016  CREATININE 0.82 07/05/2016  AST 20 07/05/2016  ALT 25 07/05/2016  PROTCRRATIO 1.11 (H) 08/29/2013  PROTEIN24HR 231 (H) 08/23/2013 Plt 0.52 Pr:Cr 108  Antenatal Testing CHTN - O10.919  Group I  BP < 140/90, no preeclampsia, AGA,  nml AFV, +/- meds    Group II BP > 140/90, on meds, no preeclampsia, AGA, nml AFV  20-28-34-38  20-24-28-32-35-38  32//2 x wk  28//BPP wkly then 32//2 x  wk  40 no meds; 39 meds  PRN or 37 Pre-eclampsia  GHTN - O13.9/Preeclampsia without severe features  - O14.00   Preeclampsia with severe features - O14.10  Q 3-4wks  Q 2 wks  28//BPP wkly then 32//2 x wk  Inpatient  37  PRN or 34    . Fatty liver disease, nonalcoholic   . 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 GBS (group B Streptococcus carrier), +RV culture, currently pregnant 03/05/2017  . GDM (gestational diabetes mellitus) 07/23/2018  . Headache   . Hyperlipemia   . Insomnia   . Normal delivery 2014   And 2018  . Recurrent kidney stones    history of kidney stones  . Steatosis of liver suggested by ultrasound 10/31/2016    Past Surgical History: Past Surgical History:  Procedure Laterality Date  . BREAST REDUCTION SURGERY  2009    Obstetrical History: OB History    Gravida  3   Para  2   Term  2   Preterm      AB      Living  2     SAB      TAB      Ectopic      Multiple  0   Live Births  2           Social History: Social History   Socioeconomic History  . Marital status: Single    Spouse name: Not on file  . Number of children: 1  . Years of education: College   . Highest education level: Not on file  Occupational History  . Occupation: POccupational psychologist  Social Needs  .  Financial resource strain: Not on file  . Food insecurity:    Worry: Not on file    Inability: Not on file  . Transportation needs:    Medical: Not on file    Non-medical: Not on file  Tobacco Use  . Smoking status: Never Smoker  . Smokeless tobacco: Never Used  Substance and Sexual Activity  . Alcohol use: No  . Drug use: No  . Sexual activity: Yes    Birth control/protection: None  Lifestyle  . Physical activity:    Days per week: Not on file    Minutes per session: Not on file  . Stress: Not on file  Relationships  . Social connections:    Talks on phone: Not on file    Gets together: Not on file    Attends religious service: Not on file    Active member of club or  organization: Not on file    Attends meetings of clubs or organizations: Not on file    Relationship status: Not on file  Other Topics Concern  . Not on file  Social History Narrative   Single 2 kids   1 boy 02/2017 DOB   1 girl 08/2013 DOB   Rarely drinks caffeine beverages    Unemployed - in past pharmacy work    Family History: Family History  Problem Relation Age of Onset  . Hypertension Mother   . Hyperlipidemia Mother   . GER disease Mother   . Hyperlipidemia Father   . Hypertension Father   . Colon cancer Neg Hx     Allergies: Allergies  Allergen Reactions  . Metoprolol Shortness Of Breath  . Clonidine Other (See Comments)    fatigue fatigue   . Tramadol Itching and Rash    Medications Prior to Admission  Medication Sig Dispense Refill Last Dose  . acetaminophen (TYLENOL) 500 MG tablet Take 500 mg by mouth every 6 (six) hours as needed for mild pain or headache.   09/26/2018 at Unknown time  . Prenatal Vit-Fe Fumarate-FA (PRENATAL MULTIVITAMIN) TABS tablet Take 1 tablet by mouth daily at 12 noon.   09/25/2018 at Unknown time  . ACCU-CHEK FASTCLIX LANCETS MISC 1 each by Percutaneous route 4 (four) times daily. 100 each 12 Taking  . glucose blood test strip Check blood sugar four times per day. 100 each 12 Taking     Review of Systems  All systems reviewed and negative except as stated in HPI  Physical Exam Blood pressure (!) 156/98, pulse 78, temperature 98.2 F (36.8 C), temperature source Oral, resp. rate 15, height 5' 3"  (1.6 m), weight 78.1 kg, last menstrual period 12/21/2017, SpO2 100 %, unknown if currently breastfeeding. General appearance: alert, oriented, NAD Lungs: normal respiratory effort Heart: regular rate Abdomen: soft, non-tender; gravid, FH appropriate for GA Extremities: No calf swelling or tenderness Neuro: Alert and oriented, 2/4 patellar reflexes  Presentation: cephalic, confirmed by ultrasound  Fetal monitoring: Reactive, Baseline 130  bpm, moderate variability, (+) accel, (-) decels.  Uterine activity: Irregular, irritable     Dilation: 3.5 Effacement (%): 40 Cervical Position: Anterior Station: -3 Presentation: Vertex Exam by:: Mary Martinique Johnson, RN   Prenatal labs: ABO, Rh: O/Positive/-- (03/25 1709) Antibody: Negative (03/25 1709) Rubella: 21.30 (03/25 1709) RPR: Non Reactive (07/26 0932)  HBsAg: Negative (03/25 1709)  HIV: Non Reactive (07/26 0932)  GC/Chlamydia: Negative (9/13)  GBS:   Negative (9/13)  Genetic screening:  Low risk NIPS Anatomy US: Normal   Prenatal Transfer Tool  Maternal Diabetes: Yes:  Diabetes Type:  Diet controlled Genetic Screening: Normal Maternal Ultrasounds/Referrals: Normal Fetal Ultrasounds or other Referrals:  Other: Received q4 week U/S via [facility]  Maternal Substance Abuse:  No Significant Maternal Medications:  None Significant Maternal Lab Results: Hgb 11.7 and as below.   Results for orders placed or performed during the hospital encounter of 09/26/18 (from the past 24 hour(s))  Protein / creatinine ratio, urine   Collection Time: 09/26/18 12:46 PM  Result Value Ref Range   Creatinine, Urine 30.00 mg/dL   Total Protein, Urine 9 mg/dL   Protein Creatinine Ratio 0.30 (H) 0.00 - 0.15 mg/mg[Cre]  CBC with Differential/Platelet   Collection Time: 09/26/18 12:53 PM  Result Value Ref Range   WBC 7.1 4.0 - 10.5 K/uL   RBC 4.37 3.87 - 5.11 MIL/uL   Hemoglobin 11.7 (L) 12.0 - 15.0 g/dL   HCT 36.0 36.0 - 46.0 %   MCV 82.4 78.0 - 100.0 fL   MCH 26.8 26.0 - 34.0 pg   MCHC 32.5 30.0 - 36.0 g/dL   RDW 14.3 11.5 - 15.5 %   Platelets 177 150 - 400 K/uL   Neutrophils Relative % 71 %   Neutro Abs 5.0 1.7 - 7.7 K/uL   Lymphocytes Relative 22 %   Lymphs Abs 1.6 0.7 - 4.0 K/uL   Monocytes Relative 6 %   Monocytes Absolute 0.4 0.1 - 1.0 K/uL   Eosinophils Relative 1 %   Eosinophils Absolute 0.1 0.0 - 0.7 K/uL   Basophils Relative 0 %   Basophils Absolute 0.0 0.0 - 0.1 K/uL   Comprehensive metabolic panel   Collection Time: 09/26/18 12:53 PM  Result Value Ref Range   Sodium 134 (L) 135 - 145 mmol/L   Potassium 3.9 3.5 - 5.1 mmol/L   Chloride 104 98 - 111 mmol/L   CO2 21 (L) 22 - 32 mmol/L   Glucose, Bld 82 70 - 99 mg/dL   BUN 8 6 - 20 mg/dL   Creatinine, Ser 0.62 0.44 - 1.00 mg/dL   Calcium 9.0 8.9 - 10.3 mg/dL   Total Protein 6.8 6.5 - 8.1 g/dL   Albumin 3.1 (L) 3.5 - 5.0 g/dL   AST 22 15 - 41 U/L   ALT 18 0 - 44 U/L   Alkaline Phosphatase 189 (H) 38 - 126 U/L   Total Bilirubin 0.5 0.3 - 1.2 mg/dL   GFR calc non Af Amer >60 >60 mL/min   GFR calc Af Amer >60 >60 mL/min   Anion gap 9 5 - 15    Patient Active Problem List   Diagnosis Date Noted  . Severe preeclampsia 09/26/2018  . Unwanted fertility 08/24/2018  . GDM (gestational diabetes mellitus) 07/23/2018  . Supervision of high risk pregnancy, antepartum 03/19/2018  . Maternal chronic hypertension, third trimester 03/19/2018  . Steatosis of liver suggested by ultrasound 10/31/2016  . Asthma 08/01/2013    Assessment: Shelly Adams is a 27 y.o. G3P2002 at 23w3dby LMP presenting for IOL for chronic hypertension superimposed with preeclampsia with severe features of uncontrolled HA. Pregnancy complicated by history of pre-eclampsia in previous pregnancy, chronic hypertension (on no controlling medication), A1GDM, NASH (by ultrasound), and Asthma (has albuterol inhaler at home, hasn't needed in years). GBS negative.    #Labor: IOL as above, start with buccal cytotec, continue serial cervical exams.  #Pain: Tylenol for mild pain  Fentanyl q1 hr for severe pain. Requesting epidural for active labor. #FWB: Category 1 strip, reactive  #ID:  GBS negative  #MOF: Wanting to breast and bottle feed, discussed trial of breastfeeding first with lactation if needed.  #MOC: Significant other to have vasectomy soon, with plan for abstinence until effective #Circ:  N/A  1. Chronic hypertension, superimposed  pre-eclampsia with severe features: Acute onset, associated with proteinuria, HA, and blurry vision. BP currently stable, average 211-155'M systolic. Platelets, Cr, LFT's wnl.   -IOL as above   -Mg 4g bolus, 2g afterwards   -Continue to monitor BP closely; will order prns if SBP >160, DBP >110  2. A1GDM: Stable, diet controlled. Last CBG 82 today.   -Monitor CBG q4 during latent labor currently   -Maintain random CBG <120     [place] PGY-1  09/26/2018, 2:53 PM   Attestation: I have seen this patient and agree with the resident's documentation. I have examined them separately, and we have discussed the plan of care.  Lambert Mody. Juleen China, DO OB/GYN Fellow

## 2018-09-26 NOTE — Anesthesia Preprocedure Evaluation (Signed)
Anesthesia Evaluation  Patient identified by MRN, date of birth, ID band Patient awake    Reviewed: Allergy & Precautions, H&P , NPO status , Patient's Chart, lab work & pertinent test results  Airway Mallampati: II  TM Distance: >3 FB Neck ROM: full    Dental no notable dental hx.    Pulmonary    Pulmonary exam normal breath sounds clear to auscultation       Cardiovascular hypertension, Pt. on home beta blockers Normal cardiovascular exam Rhythm:Regular Rate:Normal     Neuro/Psych negative neurological ROS     GI/Hepatic negative GI ROS, Neg liver ROS,   Endo/Other  diabetes, Gestational  Renal/GU negative Renal ROS  negative genitourinary   Musculoskeletal   Abdominal Normal abdominal exam  (+)   Peds  Hematology negative hematology ROS (+)   Anesthesia Other Findings   Reproductive/Obstetrics (+) Pregnancy                             Anesthesia Physical  Anesthesia Plan  ASA: III  Anesthesia Plan: Epidural   Post-op Pain Management:    Induction:   PONV Risk Score and Plan:   Airway Management Planned:   Additional Equipment:   Intra-op Plan:   Post-operative Plan:   Informed Consent: I have reviewed the patients History and Physical, chart, labs and discussed the procedure including the risks, benefits and alternatives for the proposed anesthesia with the patient or authorized representative who has indicated his/her understanding and acceptance.     Plan Discussed with:   Anesthesia Plan Comments:         Anesthesia Quick Evaluation

## 2018-09-26 NOTE — Progress Notes (Signed)
Labor Progress Note  S: Shelly Adams is a 27 y.o. G3P2002 at 9w3dadmitted for IOL for CHTN superimposed w PreE with severe features of uncontrolled HA.   Patient is doing well. Continues to endorse HA but states she does not want medications at this time. Denies vision changes, RUQ pain, LE edema.   O: BP (!) 144/88 (BP Location: Right Arm)   Pulse (!) 112   Temp 99 F (37.2 C) (Oral)   Resp 18   Ht 5' 3"  (1.6 m)   Wt 78.1 kg   LMP 12/21/2017   SpO2 100%   BMI 30.50 kg/m   FHT: 130bpm, mod var, +accels, no decels TOCO: q2-453m, irregular, patient looks comfortable during contractions  CVE: Dilation: 4.5 Effacement (%): 70 Cervical Position: Anterior Station: Ballotable Presentation: Vertex Exam by:: RN K Faucett  A&P: [age].o. [id number] for IOL for CHTN superimposed w PreE with severe features of uncontrolled HA.   Labor: IOL, s/p cytotec x1. Currently on 2 milli-units of pitocin, increase as tolerated  Fetal wellbeing: Cat 1 Pain control: desires epidural. Can consider fiorocet for HA if desired Anticipated MOD: SVD Meds: Continue Mg   SheCaroline MoreO FM Resident PGY-2 09/26/2018 9:09 PM

## 2018-09-26 NOTE — Progress Notes (Signed)
BPP done @ [facility] on 9/30. Next BPP @ [facility] on 10/7.

## 2018-09-26 NOTE — Progress Notes (Signed)
LABOR PROGRESS NOTE  Shelly Adams is a 27 y.o. G3P2002 at [redacted]w[redacted]d admitted for IOL for chronic hypertension superimposed with preeclampsia with severe features of uncontrolled HA.   Subjective: Doing well. Still endorses HA, no improvement with oxycodone. Feeling contractions.   Objective: BP (!) 146/90   Pulse (!) 103   Temp 99 F (37.2 C) (Oral)   Resp 18   Ht 5' 3"  (1.6 m)   Wt 78.1 kg   LMP 12/21/2017   SpO2 100%   BMI 30.50 kg/m  or  Vitals:   09/26/18 1703 09/26/18 1803 09/26/18 1859 09/26/18 1935  BP: (!) 148/93 (!) 146/80 (!) 146/90   Pulse: 90 99 (!) 103   Resp: 15  18   Temp: 97.7 F (36.5 C)   99 F (37.2 C)  TempSrc: Oral   Oral  SpO2:      Weight:      Height:        Dilation: 4.5 Effacement (%): 70 Cervical Position: Anterior Station: Ballotable Presentation: Vertex Exam by:: RN K Faucett FHT: baseline rate 130bpm, moderate varibility, (+) acel, (-) decel Toco: Irregular   Labs: Lab Results  Component Value Date   WBC 7.1 09/26/2018   HGB 11.7 (L) 09/26/2018   HCT 36.0 09/26/2018   MCV 82.4 09/26/2018   PLT 177 09/26/2018    Patient Active Problem List   Diagnosis Date Noted  . Severe preeclampsia 09/26/2018  . Unwanted fertility 08/24/2018  . GDM (gestational diabetes mellitus) 07/23/2018  . Supervision of high risk pregnancy, antepartum 03/19/2018  . Maternal chronic hypertension, third trimester 03/19/2018  . Steatosis of liver suggested by ultrasound 10/31/2016  . NASH (nonalcoholic steatohepatitis) 10/31/2016  . Attention deficit hyperactivity disorder (ADHD) 04/19/2016  . Seasonal allergic rhinitis due to pollen 04/14/2016  . Asthma 08/01/2013    Assessment / Plan: 27y.o. G3P2002 at 356w3dere for IOL for chronic hypertension superimposed with preeclampsia with severe features of uncontrolled HA. Pregnancy complicated by history of pre-eclampsia in previous pregnancy, chronic hypertension (on no controlling medication), A1GDM,  NASH (by ultrasound), and Asthma (has albuterol inhaler at home, hasn't needed in years). GBS negative.   Labor: IOL, progressing well. S/p cytotec x1. Start Pit 2x2.  Fetal Wellbeing:  Cat 1 strip Pain Control:  Tylenol PRN, fentanyl if needed. Requesting epidural for active labor.   Anticipated MOD:  NSVD  CHTN with pre-ecclampsia with severe features: Bp stable, 14256'Lystolic.  -Cont to monitor BP -Cont Mg infusion  -Pain control for HA as above.  A1GDM: Stable, last CBG 106. Monitor CBGs q4 while in latent labor.   SaDarrelyn HillockD.O. Family Medicine, PGY-1   09/26/2018, 8:01 PM

## 2018-09-26 NOTE — Progress Notes (Signed)
   PRENATAL VISIT NOTE  Subjective:  Shelly Adams is a 27 y.o. G3P2002 at 17w3dbeing seen today for ongoing prenatal care.  She is currently monitored for the following issues for this high-risk pregnancy and has Asthma; Steatosis of liver suggested by ultrasound; Supervision of high risk pregnancy, antepartum; Maternal chronic hypertension, third trimester; GDM (gestational diabetes mellitus); and Unwanted fertility on their problem list.  Patient reports headache and and blurry vision for a week.  Contractions: Irregular. Vag. Bleeding: None.  Movement: Present. Denies leaking of fluid.   The following portions of the patient's history were reviewed and updated as appropriate: allergies, current medications, past family history, past medical history, past social history, past surgical history and problem list. Problem list updated.  Objective:   Vitals:   09/26/18 1136  BP: (!) 152/96  Pulse: 78  Weight: 172 lb (78 kg)    Fetal Status: Fetal Heart Rate (bpm): NST   Movement: Present     General:  Alert, oriented and cooperative. Patient is in no acute distress.  Skin: Skin is warm and dry. No rash noted.   Cardiovascular: Normal heart rate noted  Respiratory: Normal respiratory effort, no problems with respiration noted  Abdomen: Soft, gravid, appropriate for gestational age.  Pain/Pressure: Present     Pelvic: Cervical exam deferred        Extremities: Normal range of motion.     Mental Status: Normal mood and affect. Normal behavior. Normal judgment and thought content.   DTR- 1+  Assessment and Plan:  Pregnancy: G3P2002 at 383w3d1. Supervision of high risk pregnancy, antepartum - to [facility] for evaluation of elevated BP, blurry vision, and headache  2. Diet controlled gestational diabetes mellitus (GDM), antepartum  3. Maternal chronic hypertension, third trimester  Term labor symptoms and general obstetric precautions including but not limited to vaginal bleeding,  contractions, leaking of fluid and fetal movement were reviewed in detail with the patient. Please refer to After Visit Summary for other counseling recommendations.  Return in about 1 week (around 10/03/2018) for NST only and HOB.  Future Appointments  Date Time Provider [place]10/06/2018  1:00 PM [facility] [place] WH-MFCUS M[facility]-US    MyEmily FilbertMD

## 2018-09-26 NOTE — Progress Notes (Signed)
Pt arrived late for appt today due to her husband was late getting home from work. She states she has been having a horrible  H/A today and had elevated BP this morning @ home. She also reports having headaches and blurry vision since last week. Pt also states she has been checking her blood sugar twice daily.

## 2018-09-27 ENCOUNTER — Encounter (HOSPITAL_COMMUNITY): Payer: Self-pay

## 2018-09-27 DIAGNOSIS — O24420 Gestational diabetes mellitus in childbirth, diet controlled: Secondary | ICD-10-CM

## 2018-09-27 DIAGNOSIS — Z3A38 38 weeks gestation of pregnancy: Secondary | ICD-10-CM

## 2018-09-27 DIAGNOSIS — O114 Pre-existing hypertension with pre-eclampsia, complicating childbirth: Secondary | ICD-10-CM

## 2018-09-27 LAB — RPR: RPR: NONREACTIVE

## 2018-09-27 LAB — GLUCOSE, CAPILLARY
Glucose-Capillary: 78 mg/dL (ref 70–99)
Glucose-Capillary: 91 mg/dL (ref 70–99)

## 2018-09-27 MED ORDER — DIBUCAINE 1 % RE OINT: 1.0000 "application " | TOPICAL_OINTMENT | RECTAL | Status: DC | PRN

## 2018-09-27 MED ORDER — ZOLPIDEM TARTRATE 5 MG PO TABS: 5.0000 mg | ORAL_TABLET | Freq: Every evening | ORAL | Status: DC | PRN

## 2018-09-27 MED ORDER — SIMETHICONE 80 MG PO CHEW: 80.0000 mg | CHEWABLE_TABLET | ORAL | Status: DC | PRN

## 2018-09-27 MED ORDER — COCONUT OIL OIL: 1.0000 "application " | TOPICAL_OIL | Status: DC | PRN

## 2018-09-27 MED ORDER — MISOPROSTOL 200 MCG PO TABS
ORAL_TABLET | ORAL | Status: AC
  Filled 2018-09-27: qty 4

## 2018-09-27 MED ORDER — PRENATAL MULTIVITAMIN CH
1.0000 | ORAL_TABLET | Freq: Every day | ORAL | Status: DC
  Administered 2018-09-27 – 2018-09-29 (×3): 1 via ORAL
  Filled 2018-09-27 (×3): qty 1

## 2018-09-27 MED ORDER — MAGNESIUM SULFATE 40 G IN LACTATED RINGERS - SIMPLE
2.0000 g/h | INTRAVENOUS | Status: AC
  Administered 2018-09-27: 2 g/h via INTRAVENOUS
  Filled 2018-09-27: qty 500

## 2018-09-27 MED ORDER — IBUPROFEN 600 MG PO TABS
600.0000 mg | ORAL_TABLET | Freq: Four times a day (QID) | ORAL | Status: DC
  Administered 2018-09-27 – 2018-09-29 (×9): 600 mg via ORAL
  Filled 2018-09-27 (×9): qty 1

## 2018-09-27 MED ORDER — WITCH HAZEL-GLYCERIN EX PADS: 1.0000 "application " | MEDICATED_PAD | CUTANEOUS | Status: DC | PRN

## 2018-09-27 MED ORDER — HYDROMORPHONE HCL 1 MG/ML IJ SOLN
1.0000 mg | INTRAMUSCULAR | Status: DC | PRN
  Administered 2018-09-27 (×2): 1 mg via INTRAVENOUS
  Filled 2018-09-27 (×2): qty 1

## 2018-09-27 MED ORDER — ONDANSETRON HCL 4 MG PO TABS: 4.0000 mg | ORAL_TABLET | ORAL | Status: DC | PRN

## 2018-09-27 MED ORDER — MISOPROSTOL 200 MCG PO TABS
800.0000 ug | ORAL_TABLET | Freq: Once | ORAL | Status: AC
  Administered 2018-09-27: 800 ug via RECTAL

## 2018-09-27 MED ORDER — DIPHENHYDRAMINE HCL 25 MG PO CAPS
25.0000 mg | ORAL_CAPSULE | Freq: Four times a day (QID) | ORAL | Status: DC | PRN
  Administered 2018-09-28: 25 mg via ORAL
  Filled 2018-09-27: qty 1

## 2018-09-27 MED ORDER — SENNOSIDES-DOCUSATE SODIUM 8.6-50 MG PO TABS
2.0000 | ORAL_TABLET | ORAL | Status: DC
  Administered 2018-09-27 – 2018-09-28 (×2): 2 via ORAL
  Filled 2018-09-27 (×2): qty 2

## 2018-09-27 MED ORDER — LACTATED RINGERS IV SOLN
INTRAVENOUS | Status: DC
  Administered 2018-09-27 – 2018-09-28 (×3): via INTRAVENOUS

## 2018-09-27 MED ORDER — ONDANSETRON HCL 4 MG/2ML IJ SOLN: 4.0000 mg | INTRAMUSCULAR | Status: DC | PRN

## 2018-09-27 MED ORDER — BENZOCAINE-MENTHOL 20-0.5 % EX AERO
1.0000 "application " | INHALATION_SPRAY | CUTANEOUS | Status: DC | PRN
  Administered 2018-09-27: 1 via TOPICAL
  Filled 2018-09-27 (×2): qty 56

## 2018-09-27 MED ORDER — OXYCODONE-ACETAMINOPHEN 7.5-325 MG PO TABS
1.0000 | ORAL_TABLET | Freq: Once | ORAL | Status: AC
  Administered 2018-09-27: 1 via ORAL
  Filled 2018-09-27: qty 1

## 2018-09-27 MED ORDER — ACETAMINOPHEN 325 MG PO TABS: 650.0000 mg | ORAL_TABLET | ORAL | Status: DC | PRN

## 2018-09-27 MED ORDER — TETANUS-DIPHTH-ACELL PERTUSSIS 5-2.5-18.5 LF-MCG/0.5 IM SUSP: 0.5000 mL | Freq: Once | INTRAMUSCULAR | Status: DC

## 2018-09-27 NOTE — Anesthesia Postprocedure Evaluation (Signed)
Anesthesia Post Note  Patient: Shelly Adams  Procedure(s) Performed: AN AD [place]     Patient location during evaluation: Mother Baby Anesthesia Type: Epidural Level of consciousness: awake and alert and oriented Pain management: satisfactory to patient Vital Signs Assessment: post-procedure vital signs reviewed and stable Respiratory status: respiratory function stable Cardiovascular status: stable Postop Assessment: no headache, no backache, epidural receding, patient able to bend at knees, no signs of nausea or vomiting and adequate PO intake Anesthetic complications: no    Last Vitals:  Vitals:   09/27/18 1449 09/27/18 1533  BP: 131/74 126/83  Pulse: (!) 107 100  Resp: 18 18  Temp: 36.8 C 37.1 C  SpO2: 99% 100%    Last Pain:  Vitals:   09/27/18 1600  TempSrc:   PainSc: 8    Pain Goal:                 Rane Blitch

## 2018-09-27 NOTE — Progress Notes (Signed)
LABOR PROGRESS NOTE  Shelly Adams is a 27 y.o. G3P2002 at [redacted]w[redacted]d admitted for IOL for CHTN superimposed w PreE with severe features of uncontrolled HA.   Subjective: Patient states she is still very uncomfortable. States she feels "exhausted".   Objective: BP 137/79   Pulse 98   Temp 98.5 F (36.9 C) (Oral)   Resp 18   Ht 5' 3"  (1.6 m)   Wt 78.1 kg   LMP 12/21/2017   SpO2 100%   BMI 30.50 kg/m  or  Vitals:   09/27/18 0258 09/27/18 0301 09/27/18 0330 09/27/18 0400  BP: 139/88 (!) 145/87 133/86 137/79  Pulse: 95 92 (!) 108 98  Resp:      Temp:      TempSrc:      SpO2: 100% 100% 99% 100%  Weight:      Height:        Dilation: 10 Dilation Complete Date: 09/27/18 Dilation Complete Time: 03903Effacement (%): 100 Cervical Position: Anterior Station: -1 Presentation: Vertex Exam by:: Faucett RN FHT: baseline rate 140, moderate varibility, +acel, + early decel Toco: irregular, q2-4100m  Labs: Lab Results  Component Value Date   WBC 10.1 09/26/2018   HGB 11.8 (L) 09/26/2018   HCT 36.2 09/26/2018   MCV 82.6 09/26/2018   PLT 197 09/26/2018    Patient Active Problem List   Diagnosis Date Noted  . Severe preeclampsia 09/26/2018  . Unwanted fertility 08/24/2018  . GDM (gestational diabetes mellitus) 07/23/2018  . Supervision of high risk pregnancy, antepartum 03/19/2018  . Maternal chronic hypertension, third trimester 03/19/2018  . Steatosis of liver suggested by ultrasound 10/31/2016  . NASH (nonalcoholic steatohepatitis) 10/31/2016  . Attention deficit hyperactivity disorder (ADHD) 04/19/2016  . Seasonal allergic rhinitis due to pollen 04/14/2016  . Asthma 08/01/2013    Assessment / Plan: [age].o. G3P2002 at 3873w4dre for IOL for CHTN superimposed w PreE with severe features of uncontrolled HA.  Labor: s/p cytotec x1.Currently on4 milli-units/min of pitocin, increase as tolerated  Fetal Wellbeing:  Cat 1 Pain Control:  Epidural  Anticipated MOD:   SVD  SheCaroline MoreO 09/27/2018, 4:09 AM

## 2018-09-27 NOTE — Plan of Care (Signed)
Pts. Condition will continue to improve 

## 2018-09-27 NOTE — Progress Notes (Signed)
Patient ID: Shelly Adams, female   DOB: 06/26/1991, 27 y.o.   MRN: [id number] Asked to see patient due to perineal pain. I see a small hematoma 2 x 4 cm. Will give IV pain meds and ice pack. Continue to watch.

## 2018-09-27 NOTE — Progress Notes (Signed)
LABOR PROGRESS NOTE  Shelly Adams is a 27 y.o. G3P2002 at [redacted]w[redacted]d admitted for IOL for CHTN superimposed w PreE with severe features of uncontrolled HA.   Subjective: Patient reports epidural has helped ease some pain but still reports pain with contractions. Reports only minimal HA. Per RN patient had SROM @ 10:10PM and has had consistent fluid leakage since.   Objective: BP (!) 141/89   Pulse (!) 101   Temp 99.9 F (37.7 C) (Oral)   Resp 20   Ht 5' 3"  (1.6 m)   Wt 78.1 kg   LMP 12/21/2017   SpO2 99%   BMI 30.50 kg/m  or  Vitals:   09/26/18 2351 09/26/18 2356 09/27/18 0001 09/27/18 0027  BP: 135/89 133/87 (!) 141/89   Pulse: (!) 102 (!) 104 (!) 101   Resp:      Temp:    99.9 F (37.7 C)  TempSrc:    Oral  SpO2:      Weight:      Height:        Dilation: 8.5 Effacement (%): 100 Cervical Position: Anterior Station: -3 Presentation: Vertex Exam by:: Faucett RN FHT: baseline rate 140, moderate varibility, -acel, + early decel Toco: regular, q2-3 min  Labs: Lab Results  Component Value Date   WBC 10.1 09/26/2018   HGB 11.8 (L) 09/26/2018   HCT 36.2 09/26/2018   MCV 82.6 09/26/2018   PLT 197 09/26/2018    Patient Active Problem List   Diagnosis Date Noted  . Severe preeclampsia 09/26/2018  . Unwanted fertility 08/24/2018  . GDM (gestational diabetes mellitus) 07/23/2018  . Supervision of high risk pregnancy, antepartum 03/19/2018  . Maternal chronic hypertension, third trimester 03/19/2018  . Steatosis of liver suggested by ultrasound 10/31/2016  . NASH (nonalcoholic steatohepatitis) 10/31/2016  . Attention deficit hyperactivity disorder (ADHD) 04/19/2016  . Seasonal allergic rhinitis due to pollen 04/14/2016  . Asthma 08/01/2013    Assessment / Plan: 27y.o. G3P2002 at 331w4dere for IOL for CHTN superimposed w PreE with severe features of uncontrolled HA.   Labor: s/p cytotec x1. Currently on 8 milli-units/min of pitocin, increase as tolerated  Fetal  Wellbeing:  Category 1  Pain Control:  Epidural  Anticipated MOD:  SVD   ShCaroline MoreDO 09/27/2018, 12:31 AM

## 2018-09-28 LAB — BIRTH TISSUE RECOVERY COLLECTION (PLACENTA DONATION)

## 2018-09-28 MED ORDER — ENALAPRIL MALEATE 5 MG PO TABS
5.0000 mg | ORAL_TABLET | Freq: Every day | ORAL | Status: DC
  Administered 2018-09-28 – 2018-09-29 (×2): 5 mg via ORAL
  Filled 2018-09-28 (×4): qty 1

## 2018-09-28 MED ORDER — OXYCODONE HCL 5 MG PO TABS
5.0000 mg | ORAL_TABLET | ORAL | Status: DC | PRN
  Administered 2018-09-28 (×2): 10 mg via ORAL
  Filled 2018-09-28 (×2): qty 2

## 2018-09-28 NOTE — Progress Notes (Signed)
Pt registered for baby script, baby script box given to patient and instructions explained

## 2018-09-28 NOTE — Progress Notes (Signed)
MOB was referred for history of depression/anxiety. * Referral screened out by Clinical Social Worker because none of the following criteria appear to apply: ~ History of anxiety/depression during this pregnancy, or of post-partum depression following prior delivery. ~ Diagnosis of anxiety and/or depression within last 3 years OR * MOB's symptoms currently being treated with medication and/or therapy. Please contact the Clinical Social Worker if needs arise, by [facility] request, or if MOB scores greater than 9/yes to question 10 on [place] Postpartum Depression Screen.  Laurey Arrow, MSW, LCSW Clinical Social Work [phone]

## 2018-09-28 NOTE — Progress Notes (Signed)
Post Partum Day 1 Subjective: no complaints, up ad lib, voiding and tolerating PO Magnesium is now off. BP is elevated.  Objective: Blood pressure (!) 145/94, pulse 89, temperature 97.7 F (36.5 C), resp. rate 18, height 5' 3"  (1.6 m), weight 78.1 kg, last menstrual period 12/21/2017, SpO2 100 %, unknown if currently breastfeeding.  Physical Exam:  General: alert, cooperative and appears stated age Lochia: appropriate Uterine Fundus: firm DVT Evaluation: No evidence of DVT seen on physical exam.  Recent Labs    09/26/18 1253 09/26/18 2227  HGB 11.7* 11.8*  HCT 36.0 36.2    Assessment/Plan: Plan for discharge tomorrow, Breastfeeding and Lactation consult  Begin Vasotec for BP BabyScripts   LOS: 2 days   Shelly Adams 09/28/2018, 1:46 PM

## 2018-09-28 NOTE — Lactation Note (Signed)
This note was copied from a baby's chart. Lactation Consultation Note  Patient Name: Girl Lorna Strother TIWPY'K Date: 09/28/2018 Reason for consult: Initial assessment;Early term 37-38.6wks P3, ETI 21 hrs female infant. Mom's current feeding choice is breast and bottle feeding. Currently active in [facility] program in [place]. Per mom, infant had 4-5 wet and 4 greenish stools in past 24 hours. Mo with GDM and close spaced pregnancies has a 52 month old. Per mom, she BF her 2nd child until she was 27 months old.  LC entered room per mom, she BF infant less than 2 hours ago, BF for 30 minutes and plans to supplement with formula at next feeding this is her choice. LC unable observe latch at this time.  She doesn't want to pump at this time, will think about maybe using the DEBP tomorrow. LC discussed I&O. BF according hunger cues, 8 to 12 times within 24 hours including nights. Mom will call LC if she has any further questions, concerns or if she needs assistance with latching infant to breast.  Mom made aware of O/P services, breastfeeding support groups, community resources, and our phone # for post-discharge questions.  Maternal Data Formula Feeding for Exclusion: No Has patient been taught Hand Expression?: Yes Does the patient have breastfeeding experience prior to this delivery?: Yes  Feeding Feeding Type: Bottle Fed - Formula  LATCH Score                   Interventions Interventions: Breast feeding basics reviewed  Lactation Tools Discussed/Used WIC Program: Yes   Consult Status      Vicente Serene 09/28/2018, 1:53 AM

## 2018-09-28 NOTE — Lactation Note (Signed)
This note was copied from a baby's chart. Lactation Consultation Note  Patient Name: Shelly Adams RDEYC'X Date: 09/28/2018   Baby Shelly "Natalia" 69 hours old.  Mom getting checked by RN and dad feeding infant formula on arrival. Infant spitting formula.  Inquired about why mom does both bf and formula feeding.  Mom reports she does both bf and ff because she had a breast reduction when  was 26 years old.  Mom reports she was not sure how it was done but has never gotten a lot of milk.  Mom reports she has nipple sensation.  Mom reports does not feel temp change on nipples. Explained to mom the more menstrual cycles, and pregnancies you have the more it could possibly increase her supply. Also, the longer it has been since surgery can also-play apart.  Mom reports she feels there was no difference with supply first and second babies. Urged mom to always breastfeed first and then follow up with pumped breastmilk or formula if she feels like her infant has not gotten enough.  Mom has pump and kit in room.  But has not initatied pumping.  Urged mom to at least try to pump past bf during the day when infant is possibly not feeding as much. Offered to assist with breastfeeding.  Mom reports she knows how to breastfeed her and feels like they are okay. Mom struggling to get her latched.  Asked if I could help her,. She agreed. Laid mom back slightly and infant latched.  However mostly holding infant in mouth.  Urged breast compression/massage and stimulation to keep her awake and feeding.  No audible swallows noted at this time.  Got dad to come assist in helping to keep infant awake at there breast.  Urged mom again to d/c formula unless medically indicated and initiate pumping to help maximize what she is going to make.  Left mom and baby bf and dad try ing to help keep infant awake.  Urged mom to follow up with outpatient lactation  Maternal Data    Feeding    LATCH Score                    Interventions    Lactation Tools Discussed/Used     Consult Status      Shelly Adams 09/28/2018, 9:23 AM

## 2018-09-29 LAB — COMPREHENSIVE METABOLIC PANEL
ALBUMIN: 2.5 g/dL — AB (ref 3.5–5.0)
ALT: 20 U/L (ref 0–44)
ANION GAP: 7 (ref 5–15)
AST: 27 U/L (ref 15–41)
Alkaline Phosphatase: 111 U/L (ref 38–126)
BUN: 7 mg/dL (ref 6–20)
CHLORIDE: 106 mmol/L (ref 98–111)
CO2: 24 mmol/L (ref 22–32)
Calcium: 8.1 mg/dL — ABNORMAL LOW (ref 8.9–10.3)
Creatinine, Ser: 0.56 mg/dL (ref 0.44–1.00)
GFR calc Af Amer: >60 mL/min (ref >60)
GFR calc non Af Amer: >60 mL/min (ref >60)
GLUCOSE: 113 mg/dL — AB (ref 70–99)
POTASSIUM: 3.8 mmol/L (ref 3.5–5.1)
Sodium: 137 mmol/L (ref 135–145)
Total Bilirubin: 0.2 mg/dL — ABNORMAL LOW (ref 0.3–1.2)
Total Protein: 5.7 g/dL — ABNORMAL LOW (ref 6.5–8.1)

## 2018-09-29 MED ORDER — ASPIRIN EC 81 MG PO TBEC: 81.0000 mg | DELAYED_RELEASE_TABLET | Freq: Every day | ORAL | 3 refills | Status: DC

## 2018-09-29 MED ORDER — IBUPROFEN 600 MG PO TABS: 600.0000 mg | ORAL_TABLET | Freq: Four times a day (QID) | ORAL | 2 refills | Status: DC | PRN

## 2018-09-29 MED ORDER — ENALAPRIL MALEATE 5 MG PO TABS: 5.0000 mg | ORAL_TABLET | Freq: Every day | ORAL | 3 refills | Status: DC

## 2018-09-29 NOTE — Progress Notes (Signed)
Discharge instructions and prescriptions given to pt.  Discussed post-vaginal delivery care, signs and symptoms to report to the MD, upcoming appointments, and meds. Pt verbalizes understanding and has no questions or concerns. Pt discharged from hospital in stable condition.

## 2018-09-29 NOTE — Discharge Summary (Signed)
Postpartum Discharge Summary     Patient Name: Shelly Adams DOB: Apr 27, 1991 MRN: [id number]  Date of admission: 09/26/2018 Delivering Provider: Caroline More   Date of discharge: 09/29/2018  Admitting diagnosis: 52WKS HIGH BP Intrauterine pregnancy: [redacted]w[redacted]d    Secondary diagnosis:  Principal Problem:   Severe Pre-eclampsia superimposed on chronic hypertension, delivered Active Problems:   Asthma   Maternal chronic hypertension, third trimester   GDM (gestational diabetes mellitus)   SVD (spontaneous vaginal delivery)  Additional problems None     Discharge diagnosis: Term Pregnancy Delivered, CHTN with superimposed severe preeclampsia and GDM A1                                                                                  Post partum procedures:Magnesium sulfate x 24 hours for eclampsia prophylaxis  Augmentation: Pitocin and Cytotec  Complications: None  Hospital course:  Induction of Labor With Vaginal Delivery   27y.o. yo G3P3003 at 394w4das admitted to the hospital 09/26/2018 for induction of labor.  Indication for induction: CHTN with superimposed severe preeclampsia.  She was treated for preeclampsia during labor and had magnesium sulfate infusion that continued until 24 hours postpartum. Patient had an uncomplicated labor course as follows: Membrane Rupture Time/Date: 10:10 PM ,09/26/2018   Intrapartum Procedures: Episiotomy: None [1]                                         Lacerations:  1st degree [2];Sulcus [9]  Patient had delivery of a Viable infant on 09/27/2018.  Information for the patient's newborn:  HeShameeka, Silliman0[[id number]]Delivery Method: Vag-Spont Details of delivery can be found in separate delivery note.  Patient had a routine postpartum course. She was started on Vasotec for BP control. On day of discharge, she reported RUQ pain and was concerned about her liver, CMET was drawn and was normal and she was reassured. Patient is discharged home  09/29/18 with plans for follow up in one week.  Magnesium Sulfate recieved: Yes BMZ received: No  Physical exam  Vitals:   09/29/18 0446 09/29/18 0803 09/29/18 1056 09/29/18 1202  BP: 123/67 121/86 (!) 133/100 127/86  Pulse: 84 92 78 (!) 102  Resp: 16 18  18   Temp: 97.9 F (36.6 C) (!) 97.5 F (36.4 C)  99.1 F (37.3 C)  TempSrc: Oral Oral  Oral  SpO2: 100% 100%  100%  Weight:      Height:       General: alert, cooperative and no distress Lochia: appropriate Uterine Fundus: firm Incision: N/A DVT Evaluation: No evidence of DVT seen on physical exam. Negative Homan's sign. No cords or calf tenderness. Labs: CBC Latest Ref Rng & Units 09/26/2018 09/26/2018 07/20/2018  WBC 4.0 - 10.5 K/uL 10.1 7.1 9.2  Hemoglobin 12.0 - 15.0 g/dL 11.8(L) 11.7(L) 10.8(L)  Hematocrit 36.0 - 46.0 % 36.2 36.0 33.0(L)  Platelets 150 - 400 K/uL 197 177 199   CMP Latest Ref Rng & Units 09/29/2018 09/26/2018 03/19/2018  Glucose 70 - 99 mg/dL 113(H) 82 84  BUN 6 -  20 mg/dL 7 8 3(L)  Creatinine 0.44 - 1.00 mg/dL 0.56 0.62 0.55(L)  Sodium 135 - 145 mmol/L 137 134(L) 138  Potassium 3.5 - 5.1 mmol/L 3.8 3.9 3.8  Chloride 98 - 111 mmol/L 106 104 101  CO2 22 - 32 mmol/L 24 21(L) 20  Calcium 8.9 - 10.3 mg/dL 8.1(L) 9.0 9.2  Total Protein 6.5 - 8.1 g/dL 5.7(L) 6.8 7.1  Total Bilirubin 0.3 - 1.2 mg/dL 0.2(L) 0.5 0.2  Alkaline Phos 38 - 126 U/L 111 189(H) 61  AST 15 - 41 U/L 27 22 19   ALT 0 - 44 U/L 20 18 18       Discharge instruction: per After Visit Summary and "Baby and Me Booklet".  After visit meds:  Allergies as of 09/29/2018      Reactions   Metoprolol Shortness Of Breath   Clonidine Other (See Comments)   fatigue fatigue   Tramadol Itching, Rash      Medication List    STOP taking these medications   ACCU-CHEK FASTCLIX LANCETS Misc   glucose blood test strip     TAKE these medications   acetaminophen 500 MG tablet Commonly known as:  TYLENOL Take 500 mg by mouth every 6 (six)  hours as needed for mild pain or headache.   aspirin EC 81 MG tablet Take 1 tablet (81 mg total) by mouth daily.   enalapril 5 MG tablet Commonly known as:  VASOTEC Take 1 tablet (5 mg total) by mouth daily.   ibuprofen 600 MG tablet Commonly known as:  ADVIL,MOTRIN Take 1 tablet (600 mg total) by mouth every 6 (six) hours as needed for headache, moderate pain or cramping.   prenatal multivitamin Tabs tablet Take 1 tablet by mouth daily at 12 noon.       Diet: routine diet  Activity: Advance as tolerated. Pelvic rest for 6 weeks.   Outpatient follow up: 1 and 6 weeks   Postpartum visits: BP check in 1 week with the following provider: Any provider For C/S patients schedule nurse incision check in weeks 2 weeks: no High risk pregnancy complicated by: HTN and GDM Delivery mode:  SVD Anticipated Birth Control:  vasectomy PP Procedures needed: 2 hour GTT  Schedule Integrated BH visit: no  Newborn Data: Live born female  Birth Weight: 7 lb 1.4 oz (3215 g) APGAR: 7, 9  Newborn Delivery   Birth date/time:  09/27/2018 04:52:00 Delivery type:  Vaginal, Spontaneous     Baby Feeding: Bottle Disposition:home with mother   09/29/2018 Verita Schneiders, MD

## 2018-09-29 NOTE — Discharge Instructions (Signed)
Vaginal Delivery, Care After Refer to this sheet in the next few weeks. These instructions provide you with information about caring for yourself after vaginal delivery. Your health care provider may also give you more specific instructions. Your treatment has been planned according to current medical practices, but problems sometimes occur. Call your health care provider if you have any problems or questions. What can I expect after the procedure? After vaginal delivery, it is common to have:  Some bleeding from your vagina.  Soreness in your abdomen, your vagina, and the area of skin between your vaginal opening and your anus (perineum).  Pelvic cramps.  Fatigue.  Follow these instructions at home: Medicines  Take over-the-counter and prescription medicines only as told by your health care provider.  If you were prescribed an antibiotic medicine, take it as told by your health care provider. Do not stop taking the antibiotic until it is finished. Driving   Do not drive or operate heavy machinery while taking prescription pain medicine.  Do not drive for 24 hours if you received a sedative. Lifestyle  Do not drink alcohol. This is especially important if you are breastfeeding or taking medicine to relieve pain.  Do not use tobacco products, including cigarettes, chewing tobacco, or e-cigarettes. If you need help quitting, ask your health care provider. Eating and drinking  Drink at least 8 eight-ounce glasses of water every day unless you are told not to by your health care provider. If you choose to breastfeed your baby, you may need to drink more water than this.  Eat high-fiber foods every day. These foods may help prevent or relieve constipation. High-fiber foods include: ? Whole grain cereals and breads. ? Brown rice. ? Beans. ? Fresh fruits and vegetables. Activity  Return to your normal activities as told by your health care provider. Ask your health care provider  what activities are safe for you.  Rest as much as possible. Try to rest or take a nap when your baby is sleeping.  Do not lift anything that is heavier than your baby or 10 lb (4.5 kg) until your health care provider says that it is safe.  Talk with your health care provider about when you can engage in sexual activity. This may depend on your: ? Risk of infection. ? Rate of healing. ? Comfort and desire to engage in sexual activity. Vaginal Care  If you have an episiotomy or a vaginal tear, check the area every day for signs of infection. Check for: ? More redness, swelling, or pain. ? More fluid or blood. ? Warmth. ? Pus or a bad smell.  Do not use tampons or douches until your health care provider says this is safe.  Watch for any blood clots that may pass from your vagina. These may look like clumps of dark red, brown, or black discharge. General instructions  Keep your perineum clean and dry as told by your health care provider.  Wear loose, comfortable clothing.  Wipe from front to back when you use the toilet.  Ask your health care provider if you can shower or take a bath. If you had an episiotomy or a perineal tear during labor and delivery, your health care provider may tell you not to take baths for a certain length of time.  Wear a bra that supports your breasts and fits you well.  If possible, have someone help you with household activities and help care for your baby for at least a few days after  you leave the hospital.  Keep all follow-up visits for you and your baby as told by your health care provider. This is important. Contact a health care provider if:  You have: ? Vaginal discharge that has a bad smell. ? Difficulty urinating. ? Pain when urinating. ? A sudden increase or decrease in the frequency of your bowel movements. ? More redness, swelling, or pain around your episiotomy or vaginal tear. ? More fluid or blood coming from your episiotomy or  vaginal tear. ? Pus or a bad smell coming from your episiotomy or vaginal tear. ? A fever. ? A rash. ? Little or no interest in activities you used to enjoy. ? Questions about caring for yourself or your baby.  Your episiotomy or vaginal tear feels warm to the touch.  Your episiotomy or vaginal tear is separating or does not appear to be healing.  Your breasts are painful, hard, or turn red.  You feel unusually sad or worried.  You feel nauseous or you vomit.  You pass large blood clots from your vagina. If you pass a blood clot from your vagina, save it to show to your health care provider. Do not flush blood clots down the toilet without having your health care provider look at them.  You urinate more than usual.  You are dizzy or light-headed.  You have not breastfed at all and you have not had a menstrual period for 12 weeks after delivery.  You have stopped breastfeeding and you have not had a menstrual period for 12 weeks after you stopped breastfeeding. Get help right away if:  You have: ? Pain that does not go away or does not get better with medicine. ? Chest pain. ? Difficulty breathing. ? Blurred vision or spots in your vision. ? Thoughts about hurting yourself or your baby.  You develop pain in your abdomen or in one of your legs.  You develop a severe headache.  You faint.  You bleed from your vagina so much that you fill two sanitary pads in one hour. This information is not intended to replace advice given to you by your health care provider. Make sure you discuss any questions you have with your health care provider. Document Released: 12/09/2000 Document Revised: 05/25/2016 Document Reviewed: 12/27/2015 Elsevier Interactive Patient Education  2018 [employer].    Preeclampsia and Eclampsia Preeclampsia is a serious condition that develops only during pregnancy. It is also called toxemia of pregnancy. This condition causes high blood pressure along  with other symptoms, such as swelling and headaches. These symptoms may develop as the condition gets worse. Preeclampsia may occur at 20 weeks of pregnancy or later. Diagnosing and treating preeclampsia early is very important. If not treated early, it can cause serious problems for you and your baby. One problem it can lead to is eclampsia, which is a condition that causes muscle jerking or shaking (convulsions or seizures) in the mother. Delivering your baby is the best treatment for preeclampsia or eclampsia. Preeclampsia and eclampsia symptoms usually go away after your baby is born. What are the causes? The cause of preeclampsia is not known. What increases the risk? The following risk factors make you more likely to develop preeclampsia:  Being pregnant for the first time.  Having had preeclampsia during a past pregnancy.  Having a family history of preeclampsia.  Having high blood pressure.  Being pregnant with twins or triplets.  Being 109 or older.  Being African-American.  Having kidney disease or diabetes.  Having medical conditions such as lupus or blood diseases.  Being very overweight (obese).  What are the signs or symptoms? The earliest signs of preeclampsia are:  High blood pressure.  Increased protein in your urine. Your health care provider will check for this at every visit before you give birth (prenatal visit).  Other symptoms that may develop as the condition gets worse include:  Severe headaches.  Sudden weight gain.  Swelling of the hands, face, legs, and feet.  Nausea and vomiting.  Vision problems, such as blurred or double vision.  Numbness in the face, arms, legs, and feet.  Urinating less than usual.  Dizziness.  Slurred speech.  Abdominal pain, especially upper abdominal pain.  Convulsions or seizures.  Symptoms generally go away after giving birth. How is this diagnosed? There are no screening tests for preeclampsia. Your  health care provider will ask you about symptoms and check for signs of preeclampsia during your prenatal visits. You may also have tests that include:  Urine tests.  Blood tests.  Checking your blood pressure.  Monitoring your babys heart rate.  Ultrasound.  How is this treated? You and your health care provider will determine the treatment approach that is best for you. Treatment may include:  Having more frequent prenatal exams to check for signs of preeclampsia, if you have an increased risk for preeclampsia.  Bed rest.  Reducing how much salt (sodium) you eat.  Medicine to lower your blood pressure.  Staying in the hospital, if your condition is severe. There, treatment will focus on controlling your blood pressure and the amount of fluids in your body (fluid retention).  You may need to take medicine (magnesium sulfate) to prevent seizures. This medicine may be given as an injection or through an IV tube.  Delivering your baby early, if your condition gets worse. You may have your labor started with medicine (induced), or you may have a cesarean delivery.  Follow these instructions at home: Eating and drinking   Drink enough fluid to keep your urine clear or pale yellow.  Eat a healthy diet that is low in sodium. Do not add salt to your food. Check nutrition labels to see how much sodium a food or beverage contains.  Avoid caffeine. Lifestyle  Do not use any products that contain nicotine or tobacco, such as cigarettes and e-cigarettes. If you need help quitting, ask your health care provider.  Do not use alcohol or drugs.  Avoid stress as much as possible. Rest and get plenty of sleep. General instructions  Take over-the-counter and prescription medicines only as told by your health care provider.  When lying down, lie on your side. This keeps pressure off of your baby.  When sitting or lying down, raise (elevate) your feet. Try putting some pillows  underneath your lower legs.  Exercise regularly. Ask your health care provider what kinds of exercise are best for you.  Keep all follow-up and prenatal visits as told by your health care provider. This is important. How is this prevented? To prevent preeclampsia or eclampsia from developing during another pregnancy:  Get proper medical care during pregnancy. Your health care provider may be able to prevent preeclampsia or diagnose and treat it early.  Your health care provider may have you take a low-dose aspirin or a calcium supplement during your next pregnancy.  You may have tests of your blood pressure and kidney function after giving birth.  Maintain a healthy weight. Ask your health care provider for  help managing weight gain during pregnancy.  Work with your health care provider to manage any long-term (chronic) health conditions you have, such as diabetes or kidney problems.  Contact a health care provider if:  You gain more weight than expected.  You have headaches.  You have nausea or vomiting.  You have abdominal pain.  You feel dizzy or light-headed. Get help right away if:  You develop sudden or severe swelling anywhere in your body. This usually happens in the legs.  You gain 5 lbs (2.3 kg) or more during one week.  You have severe: ? Abdominal pain. ? Headaches. ? Dizziness. ? Vision problems. ? Confusion. ? Nausea or vomiting.  You have a seizure.  You have trouble moving any part of your body.  You develop numbness in any part of your body.  You have trouble speaking.  You have any abnormal bleeding.  You pass out. This information is not intended to replace advice given to you by your health care provider. Make sure you discuss any questions you have with your health care provider. Document Released: 12/09/2000 Document Revised: 08/09/2016 Document Reviewed: 07/18/2016 Elsevier Interactive Patient Education  [employer].

## 2018-10-01 ENCOUNTER — Ambulatory Visit (HOSPITAL_COMMUNITY): Payer: Medicaid Other

## 2018-10-01 ENCOUNTER — Encounter (HOSPITAL_COMMUNITY): Payer: Self-pay

## 2018-10-03 ENCOUNTER — Other Ambulatory Visit: Payer: Self-pay

## 2018-10-03 ENCOUNTER — Encounter: Payer: Self-pay | Admitting: Obstetrics and Gynecology

## 2018-10-04 ENCOUNTER — Ambulatory Visit (INDEPENDENT_AMBULATORY_CARE_PROVIDER_SITE_OTHER): Payer: Medicaid Other | Admitting: General Practice

## 2018-10-04 VITALS — BP 131/89 | HR 84 | Ht 61.0 in | Wt 155.0 lb

## 2018-10-04 DIAGNOSIS — Z0130 Encounter for examination of blood pressure without abnormal findings: Secondary | ICD-10-CM

## 2018-10-04 NOTE — Progress Notes (Signed)
Patient presents to office today for blood pressure check following SVD 1 week ago. Patient reports taking vasotec daily, usually at bedtime. Patient reports occasional headaches but denies dizziness or blurry vision.   Reviewed blood pressures with Noni Saupe who states blood pressures look fine today, patient can follow up at pp visit.  Discussed with patient continuing medication & scheduling pp visit. Discussed returning to [facility] for worsening headaches unrelieved with medication/rest, dizziness, or blurry vision. Patient verbalized understanding & had no questions.

## 2018-10-05 NOTE — Progress Notes (Signed)
I agree with the RN's plan of care and documentation.  BP today 131/89 Patient is without headaches currently Preeclampsia precautions reviewed with patient by RN. If Ha's persist she should go to [facility] for full evaluation. Patient agreeable.  Lezlie Lye, NP 10/05/2018 9:51 AM

## 2018-11-07 ENCOUNTER — Ambulatory Visit: Payer: Self-pay | Admitting: Nurse Practitioner

## 2018-11-14 ENCOUNTER — Ambulatory Visit (INDEPENDENT_AMBULATORY_CARE_PROVIDER_SITE_OTHER): Payer: Medicaid Other | Admitting: Obstetrics and Gynecology

## 2018-11-14 ENCOUNTER — Encounter: Payer: Self-pay | Admitting: Obstetrics and Gynecology

## 2018-11-14 VITALS — BP 125/75 | HR 82

## 2018-11-14 DIAGNOSIS — Z30011 Encounter for initial prescription of contraceptive pills: Secondary | ICD-10-CM

## 2018-11-14 DIAGNOSIS — Z1389 Encounter for screening for other disorder: Secondary | ICD-10-CM | POA: Diagnosis not present

## 2018-11-14 DIAGNOSIS — Z3202 Encounter for pregnancy test, result negative: Secondary | ICD-10-CM

## 2018-11-14 LAB — POCT PREGNANCY, URINE: Preg Test, Ur: NEGATIVE

## 2018-11-14 MED ORDER — NORETHINDRONE 0.35 MG PO TABS: 1.0000 | ORAL_TABLET | Freq: Every day | ORAL | 11 refills | Status: DC

## 2018-11-14 NOTE — Progress Notes (Signed)
Lactation Consultation Outpatient Visit Note  Patient Name: Shelly Adams Date of Birth: 1991/08/06 Gestational Age at Delivery: 51w4dType of Delivery: NSVD  Breastfeeding History: Frequency of Breastfeeding: 8+ times per day Length of Feeding: 143m or less per side per feeding  Supplementing / Method: Pumping:  Frequency: 2-3 times per day  Volume:  2oz per feeding  Comments: Baby is taking 4-5oz of formula after each feeding because she is still exhibiting feeding cues. Baby also spits up frequently and "acts like she has reflux." Mom has a history of breast reduction at 27yo Assessment: Possible low milk supply related to breast reduction. Encouraged mom to increase time at breast to 202mper breast/feeding, and post-pump at least 3 times per day.   Gave recommendations for herbal supplements for milk supply.  Discussed/taught paced bottle feeding.  Follow-Up PRN follow up for breastfeeding concerns, or may see outpatient lactation.  JamGabriel CarinaNM 11/14/2018, 6:41 PM

## 2018-11-14 NOTE — Progress Notes (Signed)
Subjective:     Shelly Adams is a 27 y.o. female who presents for a postpartum visit. She is 7 weeks postpartum following a spontaneous vaginal delivery. I have fully reviewed the prenatal and intrapartum course. The delivery was at 38.4 gestational weeks. Outcome: spontaneous vaginal delivery. Anesthesia: epidural. Postpartum course has been uncomplicated. Baby's course has been uncomplicated. Baby is feeding by both breast and bottle - gerber soothe (5 oz at a time. Bleeding no bleeding. Bowel function is normal. Bladder function is normal. Patient is not sexually active. Contraception method is OCP (estrogen/progesterone). Postpartum depression screening: negative.  The following portions of the patient's history were reviewed and updated as appropriate: allergies, current medications, past family history, past medical history, past social history, past surgical history and problem list.   Review of Systems Constitutional: negative Eyes: negative Ears, nose, mouth, throat, and face: negative Respiratory: negative Cardiovascular: negative Gastrointestinal: negative Genitourinary:negative -- well-healed vaginal repair Integument/breast: positive for decreased milk supply Hematologic/lymphatic: negative Musculoskeletal:negative Neurological: negative Behavioral/Psych: negative Endocrine: negative Allergic/Immunologic: negative   Objective:    LMP 10/24/2018 - stopped 4 wks afterwards    General:  alert, cooperative and no distress   Breasts:  inspection negative, no nipple discharge or bleeding, no masses or nodularity palpable  Lungs: clear to auscultation bilaterally  Heart:  regular rate and rhythm, S1, S2 normal, no murmur, click, rub or gallop  Abdomen: soft, non-tender; bowel sounds normal; no masses,  no organomegaly   Vulva:  normal  Vagina: normal vagina  Cervix:  anteverted, no cervical motion tenderness and no lesions  Corpus: normal size, contour, position, consistency,  mobility, non-tender  Adnexa:  normal adnexa  Rectal Exam: Not performed.        Assessment:     Normal postpartum exam. Pap smear not done at today's visit; due 10/2019.   Plan:   1. Contraception: oral progesterone-only contraceptive and vaginal spermicide 2. Extensive education on methods to increase breast milk supply done by Gaylan Gerold, IBCLC, [facility] -- see documentation 3. Follow up in: 1 year or as needed.

## 2019-05-01 ENCOUNTER — Emergency Department (HOSPITAL_BASED_OUTPATIENT_CLINIC_OR_DEPARTMENT_OTHER): Payer: Medicaid Other

## 2019-05-01 ENCOUNTER — Other Ambulatory Visit: Payer: Self-pay

## 2019-05-01 ENCOUNTER — Emergency Department (HOSPITAL_BASED_OUTPATIENT_CLINIC_OR_DEPARTMENT_OTHER)
Admission: EM | Admit: 2019-05-01 | Discharge: 2019-05-01 | Disposition: A | Discharge status: Home / Self Care | Payer: Medicaid Other | Attending: Emergency Medicine | Admitting: Emergency Medicine

## 2019-05-01 ENCOUNTER — Encounter (HOSPITAL_BASED_OUTPATIENT_CLINIC_OR_DEPARTMENT_OTHER): Payer: Self-pay | Admitting: Emergency Medicine

## 2019-05-01 DIAGNOSIS — R51 Headache: Secondary | ICD-10-CM | POA: Diagnosis present

## 2019-05-01 DIAGNOSIS — R519 Headache, unspecified: Secondary | ICD-10-CM

## 2019-05-01 DIAGNOSIS — R030 Elevated blood-pressure reading, without diagnosis of hypertension: Secondary | ICD-10-CM | POA: Insufficient documentation

## 2019-05-01 DIAGNOSIS — Z79899 Other long term (current) drug therapy: Secondary | ICD-10-CM | POA: Insufficient documentation

## 2019-05-01 DIAGNOSIS — J45909 Unspecified asthma, uncomplicated: Secondary | ICD-10-CM | POA: Diagnosis not present

## 2019-05-01 LAB — BASIC METABOLIC PANEL
Anion gap: 8 (ref 5–15)
BUN: 15 mg/dL (ref 6–20)
CO2: 25 mmol/L (ref 22–32)
Calcium: 9.2 mg/dL (ref 8.9–10.3)
Chloride: 103 mmol/L (ref 98–111)
Creatinine, Ser: 0.69 mg/dL (ref 0.44–1.00)
GFR calc Af Amer: >60 mL/min (ref >60)
GFR calc non Af Amer: >60 mL/min (ref >60)
Glucose, Bld: 113 mg/dL — ABNORMAL HIGH (ref 70–99)
Potassium: 3 mmol/L — ABNORMAL LOW (ref 3.5–5.1)
Sodium: 136 mmol/L (ref 135–145)

## 2019-05-01 LAB — CBC WITH DIFFERENTIAL/PLATELET
Abs Immature Granulocytes: 0.03 10*3/uL (ref 0.00–0.07)
Basophils Absolute: 0.1 10*3/uL (ref 0.0–0.1)
Basophils Relative: 1 %
Eosinophils Absolute: 0.8 10*3/uL — ABNORMAL HIGH (ref 0.0–0.5)
Eosinophils Relative: 9 %
HCT: 42.1 % (ref 36.0–46.0)
Hemoglobin: 13.8 g/dL (ref 12.0–15.0)
Immature Granulocytes: 0 %
Lymphocytes Relative: 31 %
Lymphs Abs: 2.7 10*3/uL (ref 0.7–4.0)
MCH: 28.7 pg (ref 26.0–34.0)
MCHC: 32.8 g/dL (ref 30.0–36.0)
MCV: 87.5 fL (ref 80.0–100.0)
Monocytes Absolute: 0.6 10*3/uL (ref 0.1–1.0)
Monocytes Relative: 7 %
Neutro Abs: 4.6 10*3/uL (ref 1.7–7.7)
Neutrophils Relative %: 52 %
Platelets: 260 10*3/uL (ref 150–400)
RBC: 4.81 MIL/uL (ref 3.87–5.11)
RDW: 14.2 % (ref 11.5–15.5)
WBC: 8.9 10*3/uL (ref 4.0–10.5)
nRBC: 0 % (ref 0.0–0.2)

## 2019-05-01 LAB — TROPONIN I: Troponin I: <0.03 ng/mL (ref <0.03)

## 2019-05-01 LAB — D-DIMER, QUANTITATIVE: D-Dimer, Quant: 0.33 ug/mL-FEU (ref 0.00–0.50)

## 2019-05-01 LAB — HCG, SERUM, QUALITATIVE: Preg, Serum: NEGATIVE

## 2019-05-01 MED ORDER — DIPHENHYDRAMINE HCL 50 MG/ML IJ SOLN
25.0000 mg | Freq: Once | INTRAMUSCULAR | Status: AC
  Administered 2019-05-01: 03:00:00 25 mg via INTRAVENOUS
  Filled 2019-05-01: qty 1

## 2019-05-01 MED ORDER — POTASSIUM CHLORIDE CRYS ER 20 MEQ PO TBCR
40.0000 meq | EXTENDED_RELEASE_TABLET | Freq: Once | ORAL | Status: AC
  Administered 2019-05-01: 40 meq via ORAL
  Filled 2019-05-01: qty 2

## 2019-05-01 MED ORDER — IOHEXOL 350 MG/ML SOLN
100.0000 mL | Freq: Once | INTRAVENOUS | Status: AC | PRN
  Administered 2019-05-01: 03:00:00 100 mL via INTRAVENOUS

## 2019-05-01 MED ORDER — METOCLOPRAMIDE HCL 5 MG/ML IJ SOLN
10.0000 mg | Freq: Once | INTRAMUSCULAR | Status: AC
  Administered 2019-05-01: 03:00:00 10 mg via INTRAVENOUS
  Filled 2019-05-01: qty 2

## 2019-05-01 MED ORDER — KETOROLAC TROMETHAMINE 30 MG/ML IJ SOLN
30.0000 mg | Freq: Once | INTRAMUSCULAR | Status: AC
  Administered 2019-05-01: 04:00:00 30 mg via INTRAVENOUS
  Filled 2019-05-01: qty 1

## 2019-05-01 MED ORDER — AMLODIPINE BESYLATE 5 MG PO TABS
5.0000 mg | ORAL_TABLET | Freq: Once | ORAL | Status: AC
  Administered 2019-05-01: 04:00:00 5 mg via ORAL
  Filled 2019-05-01: qty 1

## 2019-05-01 NOTE — ED Provider Notes (Signed)
[facility] EMERGENCY DEPARTMENT Provider Note   CSN: [id number] Arrival date & time: 05/01/19  0136    History   Chief Complaint Chief Complaint  Patient presents with  . Headache    HPI Shelly Adams is a 28 y.o. female.     Patient presents with diffuse headache progressively worsening since yesterday.  This is associated with elevated blood pressure at home.  States her blood pressure was 588-502 systolic at home.  She does not take blood pressure medication.  States she had preeclampsia with both of her pregnancies and had been taking enalapril but was told to stop this after she delivered.  States she normally does not take blood pressure medication when she is not pregnant.  She did take 1 dose of enalapril yesterday that she had laying around at home.  She feels like the headache is associated with shortness of breath and sensation of anxiety.  Denies any chest pain, cough, fever, abdominal pain, nausea or vomiting.  No photophobia or phonophobia.  No focal weakness, numbness or tingling.  Denies any recent sick contacts or recent travel.  No exposures to coronavirus.  Patient came in tonight because she was worried about her blood pressure and worried about her headache.  States she is been taking ibuprofen at home for the headache as well without relief.  She has a difficult time describing whether the headache is thunderclap sudden onset or more of a gradual progression.  She reports this is not the worst headache she is ever had.  The history is provided by the patient.    Past Medical History:  Diagnosis Date  . Anxiety   . Asthma   . Benign essential hypertension, antepartum 07/29/2013   On propranolol-->labetalol [x]  Aspirin 81 mg daily after 12 weeks; discontinue after 36 weeks Current antihypertensives:  Labetalol   Baseline and surveillance labs (pulled in from [facility], refresh links as needed)  Lab Results Component Value Date  PLT 198 10/17/2016  CREATININE 0.82  07/05/2016  AST 20 07/05/2016  ALT 25 07/05/2016  PROTCRRATIO 1.11 (H) 08/29/2013  PROTEIN24HR 231 (H) 08/23/2013 Plt 0.52 Pr:Cr 108  Antenatal Testing CHTN - O10.919  Group I  BP < 140/90, no preeclampsia, AGA,  nml AFV, +/- meds    Group II BP > 140/90, on meds, no preeclampsia, AGA, nml AFV  20-28-34-38  20-24-28-32-35-38  32//2 x wk  28//BPP wkly then 32//2 x wk  40 no meds; 39 meds  PRN or 37 Pre-eclampsia  GHTN - O13.9/Preeclampsia without severe features  - O14.00   Preeclampsia with severe features - O14.10  Q 3-4wks  Q 2 wks  28//BPP wkly then 32//2 x wk  Inpatient  37  PRN or 34    . Fatty liver disease, nonalcoholic   . 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 GBS (group B Streptococcus carrier), +RV culture, currently pregnant 03/05/2017  . GDM (gestational diabetes mellitus) 07/23/2018  . Headache   . Hyperlipemia   . Insomnia   . Normal delivery 2014   And 2018  . Recurrent kidney stones    history of kidney stones  . Steatosis of liver suggested by ultrasound 10/31/2016    Patient Active Problem List   Diagnosis Date Noted  . SVD (spontaneous vaginal delivery) 09/27/2018  . Severe Pre-eclampsia superimposed on chronic hypertension, delivered 09/26/2018  . Unwanted fertility 08/24/2018  . GDM (gestational diabetes mellitus) 07/23/2018  . Supervision of high risk pregnancy, antepartum 03/19/2018  . Maternal chronic hypertension, third trimester  03/19/2018  . Steatosis of liver suggested by ultrasound 10/31/2016  . NASH (nonalcoholic steatohepatitis) 10/31/2016  . Attention deficit hyperactivity disorder (ADHD) 04/19/2016  . Seasonal allergic rhinitis due to pollen 04/14/2016  . Asthma 08/01/2013    Past Surgical History:  Procedure Laterality Date  . BREAST REDUCTION SURGERY  2009     OB History    Gravida  3   Para  3   Term  3   Preterm      AB      Living  3     SAB      TAB      Ectopic      Multiple  0   Live Births  3            Home Medications     Prior to Admission medications   Medication Sig Start Date End Date Taking? Authorizing Provider  acetaminophen (TYLENOL) 500 MG tablet Take 500 mg by mouth every 6 (six) hours as needed for mild pain or headache.    [provider]  aspirin EC 81 MG tablet Take 1 tablet (81 mg total) by mouth daily. Patient not taking: Reported on 11/14/2018 09/29/18   Anyanwu, Sallyanne Havers, MD  enalapril (VASOTEC) 5 MG tablet Take 1 tablet (5 mg total) by mouth daily. Patient not taking: Reported on 11/14/2018 09/29/18   Anyanwu, Sallyanne Havers, MD  ibuprofen (ADVIL,MOTRIN) 600 MG tablet Take 1 tablet (600 mg total) by mouth every 6 (six) hours as needed for headache, moderate pain or cramping. Patient not taking: Reported on 11/14/2018 09/29/18   Anyanwu, Sallyanne Havers, MD  norethindrone (MICRONOR,CAMILA,ERRIN) 0.35 MG tablet Take 1 tablet (0.35 mg total) by mouth daily. 11/14/18   Laury Deep, CNM  Prenatal Vit-Fe Fumarate-FA (PRENATAL MULTIVITAMIN) TABS tablet Take 1 tablet by mouth daily at 12 noon.    [provider]    Family History Family History  Problem Relation Age of Onset  . Hypertension Mother   . Hyperlipidemia Mother   . GER disease Mother   . Hyperlipidemia Father   . Hypertension Father   . Colon cancer Neg Hx     Social History Social History   Tobacco Use  . Smoking status: Never Smoker  . Smokeless tobacco: Never Used  Substance Use Topics  . Alcohol use: No  . Drug use: No     Allergies   Metoprolol; Clonidine; and Tramadol   Review of Systems Review of Systems  Constitutional: Negative for activity change, appetite change and fever.  HENT: Negative for congestion and rhinorrhea.   Eyes: Negative for visual disturbance.  Respiratory: Positive for shortness of breath. Negative for cough.   Cardiovascular: Negative for chest pain.  Gastrointestinal: Negative for abdominal pain, nausea and vomiting.  Genitourinary: Negative for dysuria and hematuria.   Musculoskeletal: Negative for arthralgias and myalgias.  Skin: Negative for rash.  Neurological: Positive for dizziness, light-headedness and headaches.   all other systems are negative except as noted in the HPI and PMH.     Physical Exam Updated Vital Signs BP 132/73 (BP Location: Right Arm)   Pulse 88   Temp 98.3 F (36.8 C)   Resp 18   Ht 5' (1.524 m)   Wt 77.1 kg   LMP 04/15/2019   SpO2 99%   BMI 33.20 kg/m   Physical Exam Vitals signs and nursing note reviewed.  Constitutional:      General: She is not in acute distress.    Appearance:  She is well-developed. She is obese.     Comments: Mildly anxious appearing  HENT:     Head: Normocephalic and atraumatic.     Mouth/Throat:     Pharynx: No oropharyngeal exudate.  Eyes:     Conjunctiva/sclera: Conjunctivae normal.     Pupils: Pupils are equal, round, and reactive to light.  Neck:     Musculoskeletal: Normal range of motion and neck supple.     Comments: No meningismus. Cardiovascular:     Rate and Rhythm: Normal rate and regular rhythm.     Heart sounds: Normal heart sounds. No murmur.  Pulmonary:     Effort: Pulmonary effort is normal. No respiratory distress.     Breath sounds: Normal breath sounds.  Chest:     Chest wall: No tenderness.  Abdominal:     Palpations: Abdomen is soft.     Tenderness: There is no abdominal tenderness. There is no guarding or rebound.  Musculoskeletal: Normal range of motion.        General: No tenderness.  Skin:    General: Skin is warm.     Capillary Refill: Capillary refill takes less than 2 seconds.  Neurological:     General: No focal deficit present.     Mental Status: She is alert and oriented to person, place, and time. Mental status is at baseline.     Cranial Nerves: No cranial nerve deficit.     Motor: No abnormal muscle tone.     Coordination: Coordination normal.     Comments: CN 2-12 intact, no ataxia on finger to nose, no nystagmus, 5/5 strength throughout,  no pronator drift, Romberg negative, normal gait.   Psychiatric:        Behavior: Behavior normal.      ED Treatments / Results  Labs (all labs ordered are listed, but only abnormal results are displayed) Labs Reviewed  CBC WITH DIFFERENTIAL/PLATELET - Abnormal; Notable for the following components:      Result Value   Eosinophils Absolute 0.8 (*)    All other components within normal limits  BASIC METABOLIC PANEL - Abnormal; Notable for the following components:   Potassium 3.0 (*)    Glucose, Bld 113 (*)    All other components within normal limits  TROPONIN I  HCG, SERUM, QUALITATIVE  D-DIMER, QUANTITATIVE (NOT AT [facility])    EKG EKG Interpretation  Date/Time:  Wednesday May 01 2019 02:23:44 EDT Ventricular Rate:  82 PR Interval:    QRS Duration: 80 QT Interval:  360 QTC Calculation: 421 R Axis:   70 Text Interpretation:  Sinus rhythm No significant change was found Confirmed by Ezequiel Essex [phone]) on 05/01/2019 2:25:57 AM   Radiology Ct Angio Head W Or Wo Contrast  Result Date: 05/01/2019 CLINICAL DATA:  28 y/o  F; severe headache since yesterday. EXAM: CT ANGIOGRAPHY HEAD TECHNIQUE: Multidetector CT imaging of the head was performed using the standard protocol during bolus administration of intravenous contrast. Multiplanar CT image reconstructions and MIPs were obtained to evaluate the vascular anatomy. CONTRAST:  119m OMNIPAQUE IOHEXOL 350 MG/ML SOLN COMPARISON:  None. FINDINGS: CT HEAD Brain: No evidence of acute infarction, hemorrhage, hydrocephalus, extra-axial collection or mass lesion/mass effect. Vascular: No hyperdense vessel or unexpected calcification. Skull: Normal. Negative for fracture or focal lesion. Sinuses: Imaged portions are clear. Orbits: No acute finding. CTA HEAD Anterior circulation: No significant stenosis, proximal occlusion, aneurysm, or vascular malformation. Posterior circulation: No significant stenosis, proximal occlusion, aneurysm, or  vascular malformation. Venous sinuses: As  permitted by contrast timing, patent. Anatomic variants: None significant. Delayed phase: No abnormal intracranial enhancement. IMPRESSION: Normal CTA of the head. Electronically Signed   By: Kristine Garbe M.D.   On: 05/01/2019 03:30   Dg Chest 2 View  Result Date: 05/01/2019 CLINICAL DATA:  Shortness of breath, headache EXAM: CHEST - 2 VIEW COMPARISON:  None FINDINGS: Heart and mediastinal contours are within normal limits. No focal opacities or effusions. No acute bony abnormality. IMPRESSION: No active cardiopulmonary disease. Electronically Signed   By: Rolm Baptise M.D.   On: 05/01/2019 02:17    Procedures Procedures (including critical care time)  Medications Ordered in ED Medications  potassium chloride SA (K-DUR) CR tablet 40 mEq (40 mEq Oral Given 05/01/19 0316)  metoCLOPramide (REGLAN) injection 10 mg (10 mg Intravenous Given 05/01/19 0317)  diphenhydrAMINE (BENADRYL) injection 25 mg (25 mg Intravenous Given 05/01/19 0317)  iohexol (OMNIPAQUE) 350 MG/ML injection 100 mL (100 mLs Intravenous Contrast Given 05/01/19 0307)  ketorolac (TORADOL) 30 MG/ML injection 30 mg (30 mg Intravenous Given 05/01/19 0343)  amLODipine (NORVASC) tablet 5 mg (5 mg Oral Given 05/01/19 0343)     Initial Impression / Assessment and Plan / ED Course  I have reviewed the triage vital signs and the nursing notes.  Pertinent labs & imaging results that were available during my care of the patient were reviewed by me and considered in my medical decision making (see chart for details).      Headache with elevated blood pressure and shortness of breath.  No cough, fever or chest pain.  No neurological deficits.  CT head is negative for hemorrhage or aneurysm. Chest x-ray is negative, EKG is normal sinus rhythm.  Suspect patient's shortness of breath is likely more due to anxiety.  Her blood pressure has improved throughout her ED stay to 132/73.  Headache has  resolved with Toradol, Reglan and Benadryl. Low suspicion for subarachnoid hemorrhage or meningitis.  Discussed with patient need for PCP follow-up.  Recommend keep a log of blood pressure and follow-up with PCP to determine whether she needs to be on blood pressure medication.  Will not start at this time.  Return precautions discussed including worsening headache, unilateral weakness, chest pain, difficulty speaking difficulty swallowing or other concerns. Final Clinical Impressions(s) / ED Diagnoses   Final diagnoses:  Bad headache  Elevated blood pressure reading    ED Discharge Orders    None       Jamai Dolce, Annie Main, MD 05/01/19 [phone]

## 2019-05-01 NOTE — ED Triage Notes (Signed)
Pt states she is having a HA since yesterday, high BP and SOB since yesterday, denies any traveling, been close to any COVID -19 pt, no change on taste or smell. No  Nausea or vomiting.

## 2019-05-01 NOTE — Discharge Instructions (Signed)
Your work-up was reassuring with a negative head CT and normal blood work.  Keep a record of your blood pressure at home and follow-up with your doctor to determine whether you need to be on blood pressure medication on a regular basis.  Return to the ED with chest pain, unilateral weakness, difficulty speaking, difficulty swallowing or any other concerns.

## 2019-05-22 ENCOUNTER — Encounter: Payer: Self-pay | Admitting: *Deleted

## 2019-11-27 ENCOUNTER — Telehealth: Payer: Self-pay | Admitting: Lactation Services

## 2019-11-27 NOTE — Telephone Encounter (Signed)
[facility] sent in refill request for Blood sugar strips. Pt delivered in 2019. Called pt, she did not answer. LM advising pt that we will not prescribe strips as she is not pregnant currently and she will need to call her PCP for follow up with her blood sugars.Advised pt she can call the office with further questions or concerns as needed.

## 2020-01-16 IMAGING — US US MFM OB FOLLOW-UP
1 series · 14 of 28 positions shown · non-contrast
Comparison: none

[Series 1: us mfm ob follow-up · 55 acquisitions, 14 frames shown]
[im 3/55]
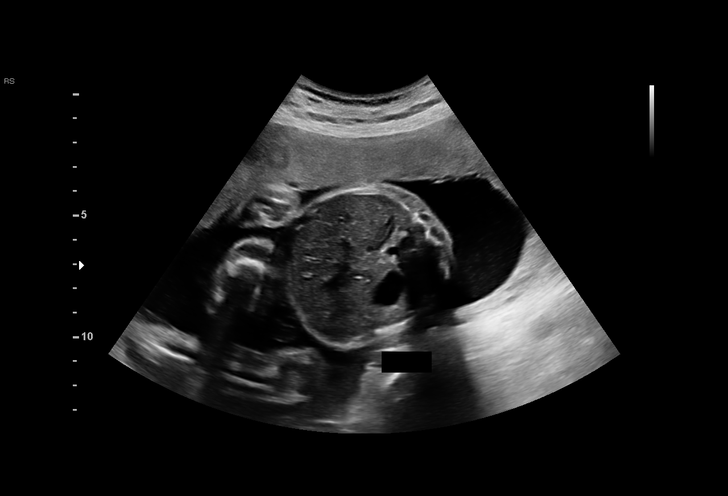
[im 7/55]
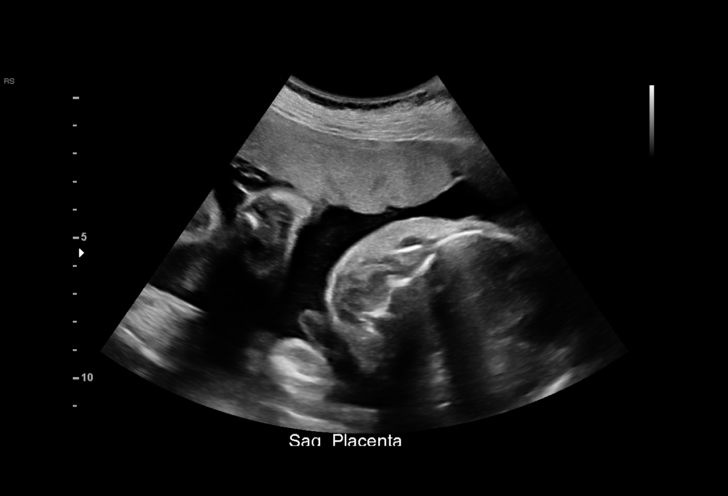
[im 11/55]
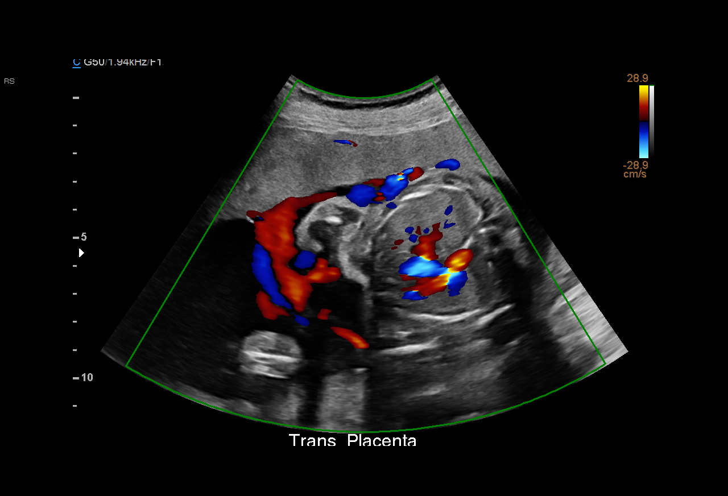
[im 15/55]
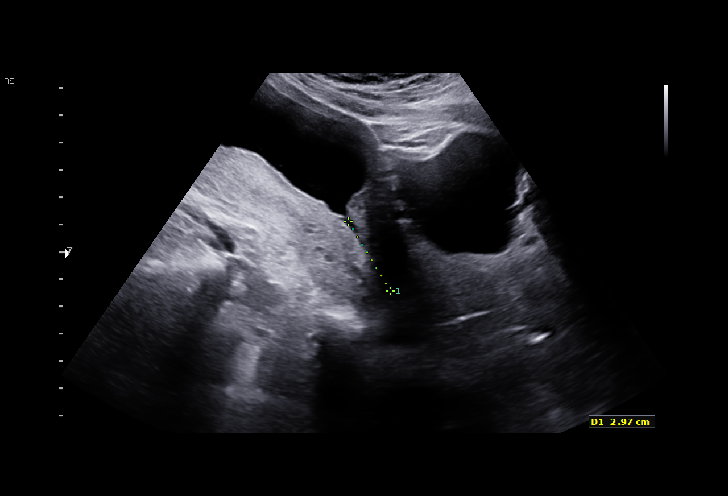
[im 19/55]
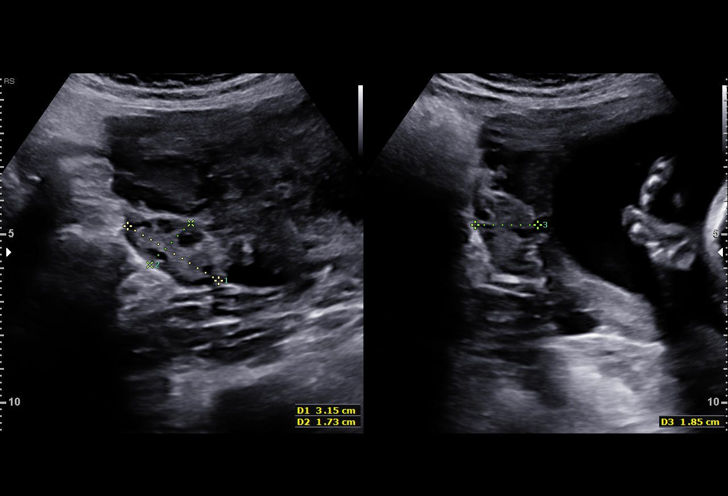
[im 23/55]
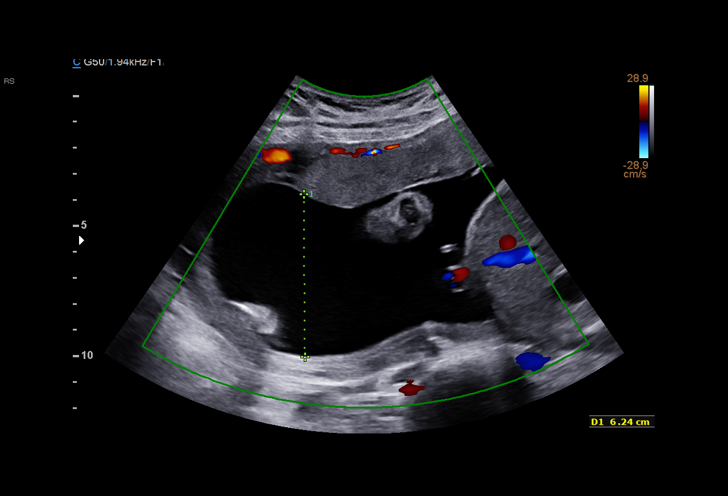
[im 27/55]
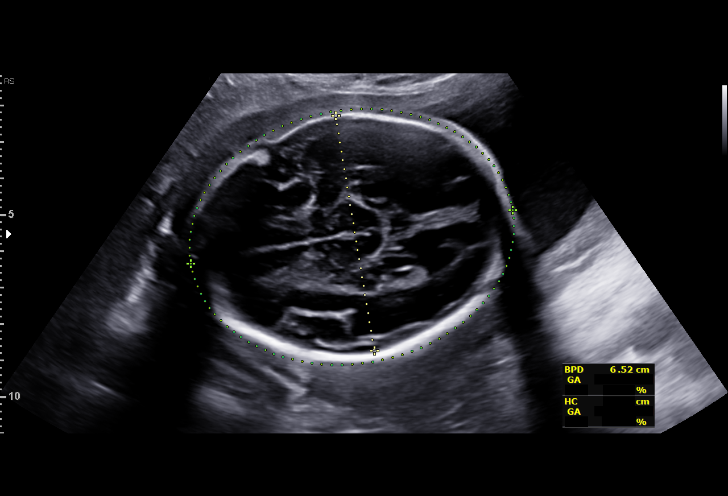
[im 31/55]
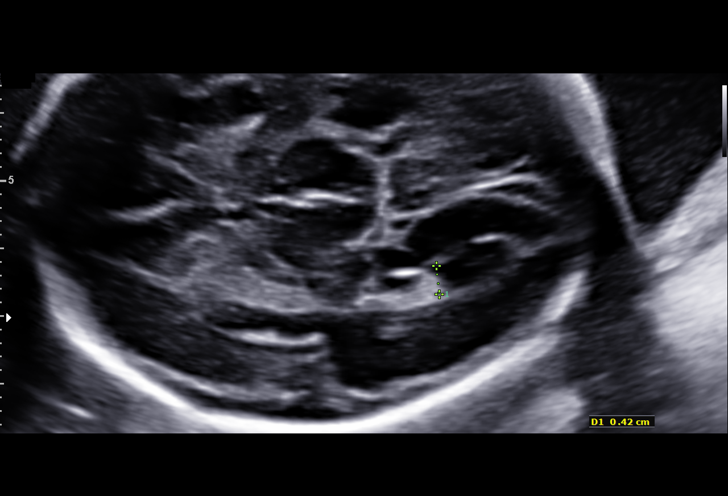
[im 35/55]
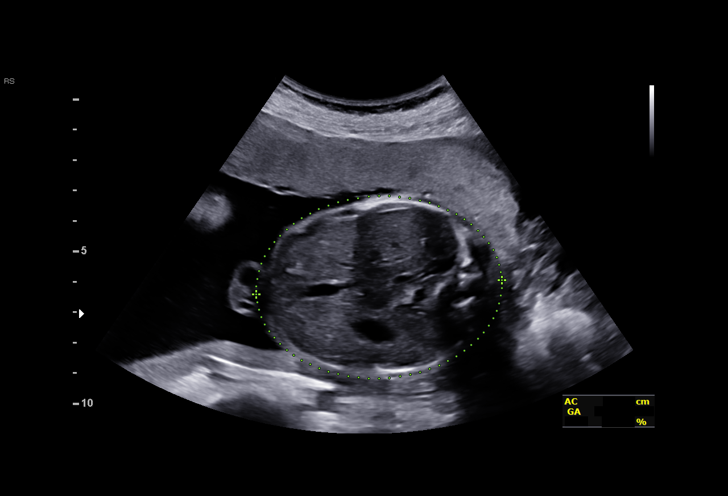
[im 39/55]
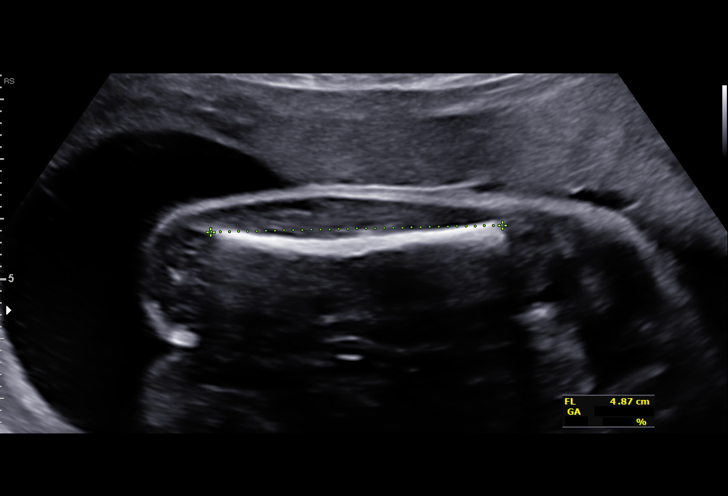
[im 43/55]
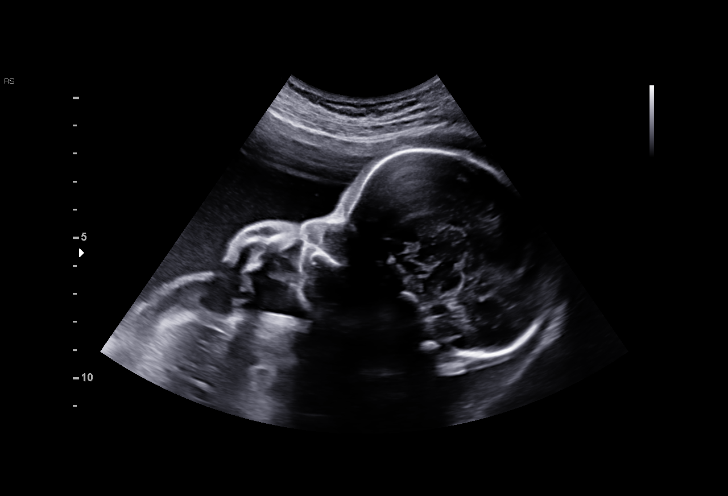
[im 47/55]
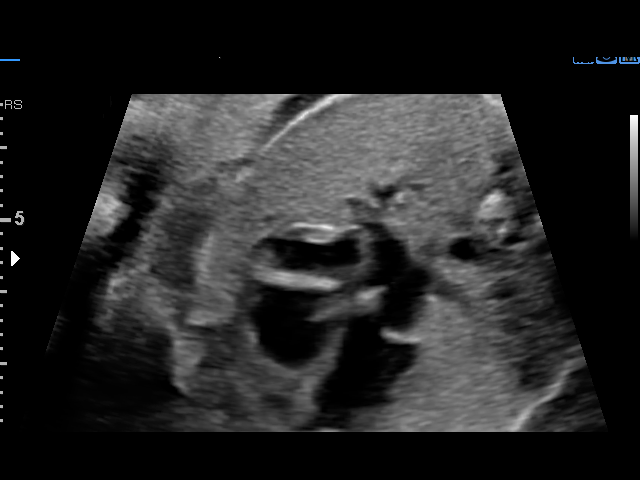
[im 51/55]
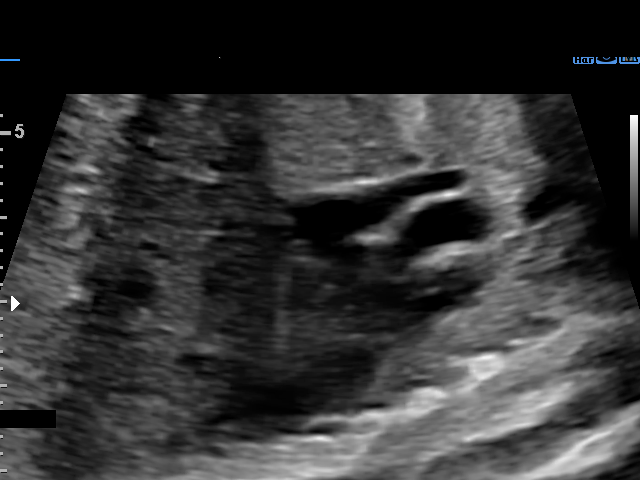
[im 55/55]
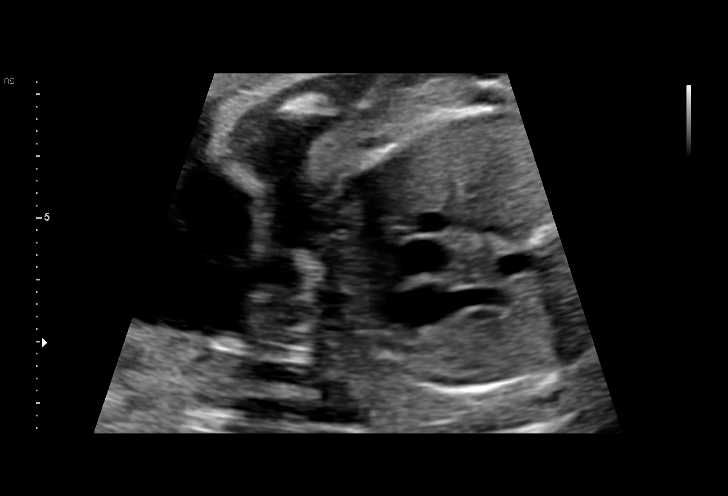

[14 of 28 positions shown; findings below may reference images not displayed]

Indications

26 weeks gestation of pregnancy
Hypertension - Chronic/Pre-existing
Fetal abnormality - other known or
suspected (Bilat UTD)
Encounter for other antenatal screening
follow-up
OB History

Blood Type:            Height:  5'     Weight (lb):  150       BMI:
Gravidity:    3         Term:   2
Living:       2
Fetal Evaluation

Num Of Fetuses:     1
Fetal Heart         144
Rate(bpm):
Cardiac Activity:   Observed
Presentation:       Cephalic
Placenta:           Anterior
P. Cord Insertion:  Not well visualized

Amniotic Fluid
AFI FV:      Subjectively within normal limits

Largest Pocket(cm)
5.9
Biometry
BPD:      65.6  mm     G. Age:  26w 3d         31  %    CI:         70.3   %    70 - 86
FL/HC:      19.6   %    18.6 -
HC:      249.5  mm     G. Age:  27w 0d         36  %    HC/AC:      1.12        1.05 -
AC:      222.4  mm     G. Age:  26w 4d         41  %    FL/BPD:     74.7   %    71 - 87
FL:         49  mm     G. Age:  26w 4d         29  %    FL/AC:      22.0   %    20 - 24
HUM:      44.9  mm     G. Age:  26w 4d         44  %

Est. FW:     962  gm      2 lb 2 oz     52  %
Gestational Age

LMP:           28w 1d        Date:  12/21/17                 EDD:   09/27/18
U/S Today:     26w 5d                                        EDD:   10/07/18
Best:          26w 5d     Det. By:  U/S  (05/11/18)          EDD:   10/07/18
Anatomy

Cranium:               Appears normal         Aortic Arch:            Previously seen
Cavum:                 Previously seen        Ductal Arch:            Previously seen
Ventricles:            Appears normal         Diaphragm:              Previously seen
Choroid Plexus:        Previously seen        Stomach:                Appears normal, left
sided
Cerebellum:            Previously seen        Abdomen:                Appears normal
Posterior Fossa:       Previously seen        Abdominal Wall:         Previously seen
Nuchal Fold:           Previously seen        Cord Vessels:           Previously seen
Face:                  Orbits and profile     Kidneys:                Appear normal
previously seen
Lips:                  Previously seen        Bladder:                Appears normal
Thoracic:              Appears normal         Spine:                  Previously seen
Heart:                 Appears normal         Upper Extremities:      Previously seen
(4CH, axis, and situs
RVOT:                  Previously seen        Lower Extremities:      Previously seen
LVOT:                  Appears normal

Other:  Fetus appears to be a female.
Cervix Uterus Adnexa

Cervix
Length:              3  cm.
Normal appearance by transabdominal scan.

Uterus
No abnormality visualized.

Left Ovary
Not visualized.

Right Ovary
Not visualized.

Adnexa:       No abnormality visualized. No adnexal mass
visualized.
Impression

antihypertensives.
Fetal growth is appropriate for gestational age. Amniotic fluid
is normal and good fetal activity is seen.
Recommendations

An appointment was made for her to return in 4 weeks for
fetal growth assessment.

## 2020-07-09 ENCOUNTER — Other Ambulatory Visit: Payer: Medicaid Other

## 2021-04-29 DIAGNOSIS — Z3201 Encounter for pregnancy test, result positive: Secondary | ICD-10-CM | POA: Diagnosis not present

## 2021-04-29 DIAGNOSIS — N911 Secondary amenorrhea: Secondary | ICD-10-CM | POA: Diagnosis not present

## 2021-05-17 DIAGNOSIS — Z1151 Encounter for screening for human papillomavirus (HPV): Secondary | ICD-10-CM | POA: Diagnosis not present

## 2021-05-17 DIAGNOSIS — O09291 Supervision of pregnancy with other poor reproductive or obstetric history, first trimester: Secondary | ICD-10-CM | POA: Diagnosis not present

## 2021-05-17 DIAGNOSIS — Z369 Encounter for antenatal screening, unspecified: Secondary | ICD-10-CM | POA: Diagnosis not present

## 2021-05-17 DIAGNOSIS — O26891 Other specified pregnancy related conditions, first trimester: Secondary | ICD-10-CM | POA: Diagnosis not present

## 2021-05-17 DIAGNOSIS — Z113 Encounter for screening for infections with a predominantly sexual mode of transmission: Secondary | ICD-10-CM | POA: Diagnosis not present

## 2021-05-17 DIAGNOSIS — Z124 Encounter for screening for malignant neoplasm of cervix: Secondary | ICD-10-CM | POA: Diagnosis not present

## 2021-05-17 DIAGNOSIS — Z3A09 9 weeks gestation of pregnancy: Secondary | ICD-10-CM | POA: Diagnosis not present

## 2021-06-25 DIAGNOSIS — O4100X0 Oligohydramnios, unspecified trimester, not applicable or unspecified: Secondary | ICD-10-CM | POA: Diagnosis not present

## 2021-06-25 DIAGNOSIS — Z3A15 15 weeks gestation of pregnancy: Secondary | ICD-10-CM | POA: Diagnosis not present

## 2021-06-25 DIAGNOSIS — O418X99 Other specified disorders of amniotic fluid and membranes, unspecified trimester, other fetus: Secondary | ICD-10-CM | POA: Diagnosis not present

## 2021-06-30 ENCOUNTER — Inpatient Hospital Stay (HOSPITAL_COMMUNITY)
Admission: AD | Admit: 2021-06-30 | Discharge: 2021-07-01 | DRG: 779 | Disposition: A | Discharge status: Home / Self Care | Payer: Medicaid Other | Attending: Obstetrics and Gynecology | Admitting: Obstetrics and Gynecology

## 2021-06-30 ENCOUNTER — Other Ambulatory Visit: Payer: Self-pay

## 2021-06-30 ENCOUNTER — Encounter (HOSPITAL_COMMUNITY): Payer: Self-pay | Admitting: Obstetrics and Gynecology

## 2021-06-30 DIAGNOSIS — O021 Missed abortion: Secondary | ICD-10-CM | POA: Diagnosis not present

## 2021-06-30 DIAGNOSIS — Z3A16 16 weeks gestation of pregnancy: Secondary | ICD-10-CM

## 2021-06-30 DIAGNOSIS — Z20822 Contact with and (suspected) exposure to covid-19: Secondary | ICD-10-CM | POA: Diagnosis not present

## 2021-06-30 HISTORY — DX: Missed abortion: O02.1

## 2021-06-30 LAB — CBC
HCT: 34.9 % — ABNORMAL LOW (ref 36.0–46.0)
Hemoglobin: 12.9 g/dL (ref 12.0–15.0)
MCH: 33.6 pg (ref 26.0–34.0)
MCHC: 37 g/dL — ABNORMAL HIGH (ref 30.0–36.0)
MCV: 90.9 fL (ref 80.0–100.0)
Platelets: 173 10*3/uL (ref 150–400)
RBC: 3.84 MIL/uL — ABNORMAL LOW (ref 3.87–5.11)
RDW: 12.3 % (ref 11.5–15.5)
WBC: 9.9 10*3/uL (ref 4.0–10.5)
nRBC: 0 % (ref 0.0–0.2)

## 2021-06-30 LAB — SARS CORONAVIRUS 2 (TAT 6-24 HRS): SARS Coronavirus 2: NEGATIVE

## 2021-06-30 LAB — TYPE AND SCREEN
ABO/RH(D): O POS
Antibody Screen: NEGATIVE

## 2021-06-30 MED ORDER — ONDANSETRON HCL 4 MG/2ML IJ SOLN: 4.0000 mg | Freq: Four times a day (QID) | INTRAMUSCULAR | Status: DC | PRN

## 2021-06-30 MED ORDER — PROPRANOLOL HCL ER 60 MG PO CP24
60.0000 mg | ORAL_CAPSULE | Freq: Every day | ORAL | Status: DC
  Administered 2021-06-30: 60 mg via ORAL
  Filled 2021-06-30: qty 1

## 2021-06-30 MED ORDER — SOD CITRATE-CITRIC ACID 500-334 MG/5ML PO SOLN: 30.0000 mL | ORAL | Status: DC | PRN

## 2021-06-30 MED ORDER — MISOPROSTOL 200 MCG PO TABS
200.0000 ug | ORAL_TABLET | ORAL | Status: DC | PRN
Start: 2021-06-30 — End: 2021-07-01
  Administered 2021-06-30 (×2): 200 ug via VAGINAL
  Filled 2021-06-30 (×3): qty 1

## 2021-06-30 MED ORDER — LIDOCAINE HCL (PF) 1 % IJ SOLN: 30.0000 mL | INTRAMUSCULAR | Status: DC | PRN

## 2021-06-30 MED ORDER — OXYCODONE HCL 5 MG PO TABS
5.0000 mg | ORAL_TABLET | ORAL | Status: DC | PRN
  Administered 2021-06-30: 5 mg via ORAL
  Filled 2021-06-30: qty 1

## 2021-06-30 MED ORDER — ACETAMINOPHEN 500 MG PO TABS
1000.0000 mg | ORAL_TABLET | Freq: Four times a day (QID) | ORAL | Status: DC | PRN
  Administered 2021-06-30: 1000 mg via ORAL
  Filled 2021-06-30: qty 2

## 2021-06-30 MED ORDER — IBUPROFEN 800 MG PO TABS
800.0000 mg | ORAL_TABLET | Freq: Three times a day (TID) | ORAL | Status: DC
  Administered 2021-06-30 – 2021-07-01 (×2): 800 mg via ORAL
  Filled 2021-06-30 (×2): qty 1

## 2021-06-30 MED ORDER — OXYCODONE-ACETAMINOPHEN 5-325 MG PO TABS: 1.0000 | ORAL_TABLET | ORAL | Status: DC | PRN

## 2021-06-30 MED ORDER — HYDROXYZINE HCL 50 MG PO TABS: 50.0000 mg | ORAL_TABLET | Freq: Four times a day (QID) | ORAL | Status: DC | PRN

## 2021-06-30 MED ORDER — OXYCODONE-ACETAMINOPHEN 5-325 MG PO TABS: 2.0000 | ORAL_TABLET | ORAL | Status: DC | PRN

## 2021-06-30 MED ORDER — BUTORPHANOL TARTRATE 1 MG/ML IJ SOLN
1.0000 mg | INTRAMUSCULAR | Status: DC | PRN
  Administered 2021-06-30: 1 mg via INTRAVENOUS
  Filled 2021-06-30: qty 1

## 2021-06-30 MED ORDER — HYDROMORPHONE HCL 1 MG/ML IJ SOLN
0.5000 mg | INTRAMUSCULAR | Status: DC | PRN
  Administered 2021-06-30: 0.5 mg via INTRAVENOUS
  Filled 2021-06-30 (×2): qty 0.5

## 2021-06-30 MED ORDER — OXYTOCIN BOLUS FROM INFUSION: 333.0000 mL | Freq: Once | INTRAVENOUS | Status: DC

## 2021-06-30 MED ORDER — ACETAMINOPHEN 325 MG PO TABS: 650.0000 mg | ORAL_TABLET | ORAL | Status: DC | PRN

## 2021-06-30 MED ORDER — MISOPROSTOL 200 MCG PO TABS
400.0000 ug | ORAL_TABLET | Freq: Once | ORAL | Status: AC
  Administered 2021-06-30: 400 ug via VAGINAL

## 2021-06-30 MED ORDER — MISOPROSTOL 200 MCG PO TABS
ORAL_TABLET | ORAL | Status: AC
  Filled 2021-06-30: qty 1

## 2021-06-30 MED ORDER — OXYTOCIN-SODIUM CHLORIDE 30-0.9 UT/500ML-% IV SOLN: 2.5000 [IU]/h | INTRAVENOUS | Status: DC

## 2021-06-30 MED ORDER — LACTATED RINGERS IV SOLN
500.0000 mL | INTRAVENOUS | Status: DC | PRN
Start: 2021-06-30 — End: 2021-07-01

## 2021-06-30 MED ORDER — LACTATED RINGERS IV SOLN: INTRAVENOUS | Status: DC

## 2021-06-30 NOTE — Progress Notes (Signed)
Patient due for second dose PV cytotec 243m. Emerges from bathroom, felt like had to have BM and wiped what she thought was umbilical cord. On examination, was part of fetal bowel. CE shows fetal parts in vagina, unable to deliver yet. Second dose placed, likely will deliver soon. Patient appears calm but does desire pain meds at this time. Anticipate delivery soon BP 139/78 (BP Location: Right Arm)   Pulse 63   Temp (!) 97.4 F (36.3 C) (Oral)   Resp 18   Ht 5' 3"  (1.6 m)   Wt 69.4 kg   SpO2 100%   BMI 27.10 kg/m

## 2021-06-30 NOTE — H&P (Signed)
Shelly Adams is a 30 y.o. female presenting for IOL for known fetal demise. Diagnosed with anhydramnios on 7/1 with confirmed rupture of membranes in office. At that time, +FHT, patient opted for follow up in week. On 7/5, BSUS confirmed no cardiac activity c/w demise. Patient given both medical and surgical options for management and opted for medical induction.  [place] prior to this was s/f CHTN on [facility] 88[facility], h/o PreE, h/o GDM, anxiety not on meds, GBS bacteriuria  H/o NSVD x3 as below  Notes some irregular cramping akin to menstrual cramps, had one small blood clot this AM in toilet OB History     Gravida  4   Para  3   Term  3   Preterm      AB      Living  3      SAB      IAB      Ectopic      Multiple  0   Live Births  3          Past Medical History:  Diagnosis Date   Anxiety    Asthma    Benign essential hypertension, antepartum 07/29/2013   On propranolol-->labetalol [x]  Aspirin 81 mg daily after 12 weeks; discontinue after 36 weeks Current antihypertensives:  Labetalol   [facility] and [facility]  Lab Results Component Value Date  PLT 198 10/17/2016  CREATININE 0.82 07/05/2016  AST 20 07/05/2016  ALT 25 07/05/2016  PROTCRRATIO 1.11 (H) 08/29/2013  PROTEIN24HR 231 (H) 08/23/2013 Plt 0.52 Pr:Cr 108  Antenatal Testing CHTN - O10.919  Group I  BP < 140/90, no preeclampsia, AGA,  nml AFV, +/- meds    Group II BP > 140/90, on meds, no preeclampsia, AGA, nml AFV  20-28-34-38  20-24-28-32-35-38  32//2 x wk  28//BPP wkly then 32//2 x wk  40 no meds; 39 meds  PRN or 37 Pre-eclampsia  GHTN - O13.9/Preeclampsia without severe features  - O14.00   Preeclampsia with severe features - O14.10  Q 3-4wks  Q 2 wks  28//BPP wkly then 32//2 x wk  Inpatient  37  PRN or 34     Fatty liver disease, nonalcoholic    Fibroid, uterine 08/01/2013   GBS (group B Streptococcus carrier), +RV culture, currently pregnant  03/05/2017   GDM (gestational diabetes mellitus) 07/23/2018   Headache    Hyperlipemia    Insomnia    Normal delivery 2014   And 2018   Recurrent kidney stones    history of kidney stones   Steatosis of liver suggested by ultrasound 10/31/2016   Past Surgical History:  Procedure Laterality Date   BREAST REDUCTION SURGERY  2009   Family History: family history includes GER disease in her mother; Hyperlipidemia in her father and mother; Hypertension in her father and mother. Social History:  reports that she has never smoked. She has never used smokeless tobacco. She reports that she does not drink alcohol and does not use drugs.     Genetic Screening: Declined Maternal Ultrasounds/Referrals: Other:Anhydramnios Maternal Substance Abuse:  No Significant Maternal Medications:  Meds include: Other: Propranolol Significant Maternal Lab Results:  Group B Strep positive Other Comments:  None  Review of Systems  Constitutional:  Negative for chills and fever.  Respiratory:  Negative for shortness of breath.   Cardiovascular:  Negative for chest pain, palpitations and leg swelling.  Gastrointestinal:  Negative for abdominal pain and vomiting.  Genitourinary:  Positive for pelvic pain (occ cramping) and vaginal bleeding (scant).  Neurological:  Negative for dizziness, weakness and headaches.  Psychiatric/Behavioral:  Negative for suicidal ideas.   Maternal Medical History:  Contractions: Frequency: rare.   Fetal activity: Perceived fetal activity is none.   Prenatal complications: PIH.    Dilation: 1 Effacement (%): 50 Station: -3 Exam by:: Dr Wilhelmenia Blase Blood pressure 124/82, pulse 84, temperature 98.6 F (37 C), temperature source Oral, resp. rate 17, height 5' 3"  (1.6 m), weight 69.4 kg, SpO2 100 %, unknown if currently breastfeeding. Exam Physical Exam Constitutional:      General: She is not in acute distress.    Appearance: She is well-developed.  HENT:     Head:  Normocephalic and atraumatic.  Eyes:     Pupils: Pupils are equal, round, and reactive to light.  Cardiovascular:     Rate and Rhythm: Normal rate and regular rhythm.     Heart sounds: No murmur heard.   No gallop.  Abdominal:     Palpations: Abdomen is soft.     Tenderness: There is no abdominal tenderness. There is no guarding or rebound.     Comments: Suprapubic fundus  Genitourinary:    Vagina: Normal.     Comments: Old dark red blood at perinuem Musculoskeletal:        General: Normal range of motion.     Cervical back: Normal range of motion and neck supple.  Skin:    General: Skin is warm and dry.  Neurological:     Mental Status: She is alert and oriented to person, place, and time.    Prenatal labs: ABO, Rh:  Opos Antibody:  neg Rubella:  imm RPR:   nr HBsAg:   neg HIV:   nr GBS:   POS  Assessment/Plan: This is a 89oy [id number] with fetal demise at 62 0/7 admitted for IOL for medical management. PNC c/b CHTN on meds, anxiety, h/o PreE and GDM and GBS bacteriuria. PNC infectious testing as well as Pap were all WNL, patient was treated adequate for GBS UTI.  Admitted for IOL via PV cytotec, first dose placed at 0930. Reviewed with patient she may have IV pain meds or PCA pump, if epidural desired would have to move to L&D. Declining any genetic testing or infectious testing as offered in demise protocol. Would like to see baby after delivery at this time. Informed of chaplain services as need.  Will continue PO propranolol given pt's anxiety as well as CHTN.   Reviewed possibility of pp suction D&C if placenta does not readily deliver. Patient understands, all questions answered.    [place] 06/30/2021, 9:56 AM

## 2021-06-30 NOTE — Plan of Care (Signed)
  Problem: Education: Goal: Knowledge of General Education information will improve Description: Including pain rating scale, medication(s)/side effects and non-pharmacologic comfort measures Outcome: Completed/Met

## 2021-06-30 NOTE — Progress Notes (Signed)
Notified by RN patient is concerned she has lost too much blood and is asking for a CBC BP (!) 104/54   Pulse 82   Temp 99 F (37.2 C) (Oral)   Resp 18   Ht 5' 3"  (1.6 m)   Wt 69.4 kg   SpO2 99%   BMI 27.10 kg/m  Non tender and non-palpable fundus. Patient has had two more clots from small golf ball to baseball size, however this has been intermittent. Automated counter states EBL is already 1200cc, however I believe this to be closer to 800cc given examination of clot and placental tissue earlier as well as my examinations of patient's peripad. Likely clot passage while urinating is due to change in position and blood collecting in posterior fornices. Multiple fundal rubs show a firm uterus and do not illicit any further clot passage. Pt with small 2cm fibroids x2, however not likely to cause significant atony (did not impinge upon endometrial stripe on earlier BSUS done s/p placental delivery)  RN states patient strongly desires CBC and to evaluate whether or not "she needs an iron infusion." Explained that, if CBC is drawn, it would be artificially low as patient has received several fluid boluses due to her feeling lightheaded. CBC therefore may not represent actual Hgb status. Despite this, patient still desires CBC. Will order stat at this time with the understanding that values may be artificially lower  In addition, suspect that patient is quite opioid naive based on lower Bps occurring after both EBL and IV dilaudid admin. Additionally, patient took AM propranolol 60ER dose this AM which is likely contributing to situation; this medication has been DC'ed for now. Finally, patient does have history of anxiety not currently on medication   At this time, per patient request, will get CBC. Hold IV fluid boluses for now. RN staff given instructions regarding reporting clot, notify me of CBC results once in. Patient encouraged to avoid Roxi, use only PRN if PO NSAIDS and Tylenol do not help with  pain control. Plan on DC in AM if pain is better controlled, patient feels improved and is able to tolerate PO.

## 2021-06-30 NOTE — Progress Notes (Signed)
I was notified around 1845 that patient has passed significant blood clot and possible placental tissue in the hat in toilet while scrubbing out from cesarean section. Patient reported palpitations and feeling momentarily diaphoretic. Upon entry into room, patient laying in bed. Appears comfortable, in NAD at this time. QBL 657. Bowl of clot and tissue examined, consistent with early 2nd trim placenta. BSUS used to show an overall thin endometrial lining with no obvious Doppler flow. Low suspicion at this time for RPOC. Non-palpable fundus at this time.   At this time, patient would like to eat something. Will see if patient can tolerate PO< ambulate, void and have decent pain control If so, patient would like to be discharged home tonight. Advised to hold home propranolol   BP (!) 106/59 (BP Location: Left Arm)   Pulse 60   Temp 99 F (37.2 C) (Oral)   Resp 17   Ht 5' 3"  (1.6 m)   Wt 69.4 kg   SpO2 100%   BMI 27.10 kg/m

## 2021-06-30 NOTE — Progress Notes (Signed)
Patient felt urge and delivered fetus at 1430. Placenta not yet delivered, BSUS shows within LUS. Palpable lower edge at cervical od but not yet ready. Has two more hours until [facility] is required. Cytotec 472mg placed x1, IV dilaudid ordered as patient felt heart racing with stadol BP 129/78 (BP Location: Right Arm)   Pulse 70   Temp 99 F (37.2 C) (Oral)   Resp 18   Ht 5' 3"  (1.6 m)   Wt 69.4 kg   SpO2 99%   BMI 27.10 kg/m

## 2021-06-30 NOTE — Progress Notes (Signed)
Patient re-evaluated. Has not had anything for pain since IV dilaudid at 1709. Wondering if she should have another IV dose. Patient is quite opioid naive, felt dizzy after first dose exacerbated by recent EBL and lack of pO intake. Patient is now tolerating PO without N/V. Sat patient up in bed, denies dizziness, nausea, SOB, palpitations, vision changes. Educated on NSAID vs opoid pain management and measures for home discharge - ambulation, PO, voiding, VSS. Will check back in a few hours to assess. Patient strongly wants to go home, however stressed importance of meeting these milestones given she has three small children at home. Patient understands.  BP (!) 105/55 (BP Location: Left Arm)   Pulse 72   Temp 99 F (37.2 C) (Oral)   Resp 18   Ht 5' 3"  (1.6 m)   Wt 69.4 kg   SpO2 99%   BMI 27.10 kg/m  Ibuprofen/tylenol scheduled both added to orders

## 2021-06-30 NOTE — Progress Notes (Signed)
Initial visit with Doroteo Bradford and FOB in pt room. Chaplain offered grief support. Shelly Adams reported that she was not doing well and was feeling weak and ill after delivery. FOB explained that she lost a lot of blood. Chaplain confirmed that medical team had been notified and asked if family would like follow up tomorrow given the present medical needs. Family agreed. Chaplain offered additional support to FOB in hallway.  Please page as further needs arise.  Donald Prose. Elyn Peers, M.Div. [facility] Chaplain Pager [phone] Office [phone]

## 2021-07-01 ENCOUNTER — Encounter (HOSPITAL_COMMUNITY): Payer: Self-pay | Admitting: *Deleted

## 2021-07-01 DIAGNOSIS — O364XX1 Maternal care for intrauterine death, fetus 1: Secondary | ICD-10-CM | POA: Diagnosis not present

## 2021-07-01 DIAGNOSIS — O021 Missed abortion: Secondary | ICD-10-CM | POA: Diagnosis not present

## 2021-07-01 LAB — CBC WITH DIFFERENTIAL/PLATELET
Abs Immature Granulocytes: 0.1 10*3/uL — ABNORMAL HIGH (ref 0.00–0.07)
Basophils Absolute: 0 10*3/uL (ref 0.0–0.1)
Basophils Relative: 0 %
Eosinophils Absolute: 0 10*3/uL (ref 0.0–0.5)
Eosinophils Relative: 0 %
HCT: 27.6 % — ABNORMAL LOW (ref 36.0–46.0)
Hemoglobin: 10.1 g/dL — ABNORMAL LOW (ref 12.0–15.0)
Immature Granulocytes: 1 %
Lymphocytes Relative: 8 %
Lymphs Abs: 1.3 10*3/uL (ref 0.7–4.0)
MCH: 33.7 pg (ref 26.0–34.0)
MCHC: 36.6 g/dL — ABNORMAL HIGH (ref 30.0–36.0)
MCV: 92 fL (ref 80.0–100.0)
Monocytes Absolute: 0.6 10*3/uL (ref 0.1–1.0)
Monocytes Relative: 4 %
Neutro Abs: 13.5 10*3/uL — ABNORMAL HIGH (ref 1.7–7.7)
Neutrophils Relative %: 87 %
Platelets: 192 10*3/uL (ref 150–400)
RBC: 3 MIL/uL — ABNORMAL LOW (ref 3.87–5.11)
RDW: 12.4 % (ref 11.5–15.5)
WBC: 15.5 10*3/uL — ABNORMAL HIGH (ref 4.0–10.5)
nRBC: 0 % (ref 0.0–0.2)

## 2021-07-01 LAB — RPR: RPR Ser Ql: NONREACTIVE

## 2021-07-01 MED ORDER — IBUPROFEN 600 MG PO TABS: 600.0000 mg | ORAL_TABLET | Freq: Four times a day (QID) | ORAL | 1 refills | Status: DC | PRN

## 2021-07-01 NOTE — Plan of Care (Signed)

## 2021-07-01 NOTE — Discharge Summary (Signed)
Physician Discharge Summary  Patient ID: Shelly Adams MRN: [id number] DOB/AGE: [birth year] 31 y.o.  Admit date: 06/30/2021 Discharge date: 07/01/2021  Admission Diagnoses:  Fetal demise at 16 weeks, PPROM  Discharge Diagnoses: same Principal Problem:   Missed abortion with fetal demise before 20 completed weeks of gestation   Discharged Condition: good  Hospital Course: Pt admitted and had cytotec induction to deliver IUFD.  She delivered after 2 doses of cytotec, delayed delivery of apparent intact placenta.  Observed overnight, pain and bleeding controlled, afebrile, stable for discharge   Discharge Exam: Blood pressure (!) 91/54, pulse 78, temperature 97.9 F (36.6 C), temperature source Oral, resp. rate 18, height 5' 3"  (1.6 m), weight 69.4 kg, SpO2 99 %, unknown if currently breastfeeding. General appearance: alert  Disposition: Discharge disposition: 01-Home or Self Care       Discharge Instructions     Diet - low sodium heart healthy   Complete by: As directed       Allergies as of 07/01/2021       Reactions   Metoprolol Shortness Of Breath   Clonidine Other (See Comments)   fatigue   Tramadol Itching, Rash        Medication List     STOP taking these medications    aspirin EC 81 MG tablet   enalapril 5 MG tablet Commonly known as: VASOTEC   norethindrone 0.35 MG tablet Commonly known as: MICRONOR       TAKE these medications    acetaminophen 500 MG tablet Commonly known as: TYLENOL Take 500 mg by mouth every 6 (six) hours as needed for mild pain or headache.   ibuprofen 600 MG tablet Commonly known as: ADVIL Take 1 tablet (600 mg total) by mouth every 6 (six) hours as needed for moderate pain.   prenatal multivitamin Tabs tablet Take 1 tablet by mouth daily at 12 noon.        Follow-up Information     Shivaji, Melida Quitter, MD. Schedule an appointment as soon as possible for a visit in 4 week(s).   Specialty: Obstetrics and  Gynecology Contact information: [address] [place] [address] [phone]                 Signed: Blane Ohara Maddisen Vought 07/01/2021, 5:38 PM

## 2021-07-01 NOTE — Progress Notes (Signed)
PPD #1 IUFD Feeling better, pain ok, bleeding is not heavy Afeb, VSS Fundus low and firm D/c home

## 2021-07-04 ENCOUNTER — Inpatient Hospital Stay (HOSPITAL_COMMUNITY): Payer: Medicaid Other

## 2021-07-04 ENCOUNTER — Other Ambulatory Visit: Payer: Self-pay

## 2021-07-04 ENCOUNTER — Inpatient Hospital Stay (HOSPITAL_COMMUNITY)
Admission: AD | Admit: 2021-07-04 | Discharge: 2021-07-04 | Disposition: A | Discharge status: Home / Self Care | Payer: Medicaid Other | Attending: Obstetrics and Gynecology | Admitting: Obstetrics and Gynecology

## 2021-07-04 ENCOUNTER — Inpatient Hospital Stay (HOSPITAL_COMMUNITY): Payer: Medicaid Other | Admitting: Anesthesiology

## 2021-07-04 ENCOUNTER — Encounter (HOSPITAL_COMMUNITY)
Admission: AD | Disposition: A | Discharge status: Home / Self Care | Payer: Self-pay | Source: Home / Self Care | Attending: Obstetrics and Gynecology

## 2021-07-04 ENCOUNTER — Encounter (HOSPITAL_COMMUNITY): Payer: Self-pay | Admitting: Family Medicine

## 2021-07-04 DIAGNOSIS — N858 Other specified noninflammatory disorders of uterus: Secondary | ICD-10-CM | POA: Diagnosis not present

## 2021-07-04 DIAGNOSIS — R102 Pelvic and perineal pain: Secondary | ICD-10-CM | POA: Diagnosis not present

## 2021-07-04 DIAGNOSIS — E119 Type 2 diabetes mellitus without complications: Secondary | ICD-10-CM | POA: Diagnosis not present

## 2021-07-04 DIAGNOSIS — O031 Delayed or excessive hemorrhage following incomplete spontaneous abortion: Secondary | ICD-10-CM | POA: Insufficient documentation

## 2021-07-04 DIAGNOSIS — Z888 Allergy status to other drugs, medicaments and biological substances status: Secondary | ICD-10-CM | POA: Insufficient documentation

## 2021-07-04 DIAGNOSIS — Z791 Long term (current) use of non-steroidal anti-inflammatories (NSAID): Secondary | ICD-10-CM | POA: Diagnosis not present

## 2021-07-04 DIAGNOSIS — Z8349 Family history of other endocrine, nutritional and metabolic diseases: Secondary | ICD-10-CM | POA: Diagnosis not present

## 2021-07-04 DIAGNOSIS — Z885 Allergy status to narcotic agent status: Secondary | ICD-10-CM | POA: Diagnosis not present

## 2021-07-04 DIAGNOSIS — D62 Acute posthemorrhagic anemia: Secondary | ICD-10-CM | POA: Diagnosis not present

## 2021-07-04 DIAGNOSIS — Z87442 Personal history of urinary calculi: Secondary | ICD-10-CM | POA: Diagnosis not present

## 2021-07-04 DIAGNOSIS — Z3A15 15 weeks gestation of pregnancy: Secondary | ICD-10-CM | POA: Insufficient documentation

## 2021-07-04 DIAGNOSIS — Z8249 Family history of ischemic heart disease and other diseases of the circulatory system: Secondary | ICD-10-CM | POA: Insufficient documentation

## 2021-07-04 DIAGNOSIS — O034 Incomplete spontaneous abortion without complication: Secondary | ICD-10-CM | POA: Diagnosis present

## 2021-07-04 DIAGNOSIS — I10 Essential (primary) hypertension: Secondary | ICD-10-CM | POA: Diagnosis not present

## 2021-07-04 DIAGNOSIS — O479 False labor, unspecified: Secondary | ICD-10-CM

## 2021-07-04 DIAGNOSIS — N939 Abnormal uterine and vaginal bleeding, unspecified: Secondary | ICD-10-CM | POA: Diagnosis not present

## 2021-07-04 HISTORY — PX: DILATION AND EVACUATION: SHX1459

## 2021-07-04 LAB — COMPREHENSIVE METABOLIC PANEL
ALT: 13 U/L (ref 0–44)
AST: 16 U/L (ref 15–41)
Albumin: 2.8 g/dL — ABNORMAL LOW (ref 3.5–5.0)
Alkaline Phosphatase: 44 U/L (ref 38–126)
Anion gap: 8 (ref 5–15)
BUN: <5 mg/dL — ABNORMAL LOW (ref 6–20)
CO2: 19 mmol/L — ABNORMAL LOW (ref 22–32)
Calcium: 8.3 mg/dL — ABNORMAL LOW (ref 8.9–10.3)
Chloride: 106 mmol/L (ref 98–111)
Creatinine, Ser: 0.63 mg/dL (ref 0.44–1.00)
GFR, Estimated: >60 mL/min (ref >60)
Glucose, Bld: 140 mg/dL — ABNORMAL HIGH (ref 70–99)
Potassium: 3.5 mmol/L (ref 3.5–5.1)
Sodium: 133 mmol/L — ABNORMAL LOW (ref 135–145)
Total Bilirubin: 0.6 mg/dL (ref 0.3–1.2)
Total Protein: 5.7 g/dL — ABNORMAL LOW (ref 6.5–8.1)

## 2021-07-04 LAB — CBC WITH DIFFERENTIAL/PLATELET
Abs Immature Granulocytes: 0.32 10*3/uL — ABNORMAL HIGH (ref 0.00–0.07)
Basophils Absolute: 0.1 10*3/uL (ref 0.0–0.1)
Basophils Relative: 1 %
Eosinophils Absolute: 0.1 10*3/uL (ref 0.0–0.5)
Eosinophils Relative: 1 %
HCT: 21.6 % — ABNORMAL LOW (ref 36.0–46.0)
Hemoglobin: 7.5 g/dL — ABNORMAL LOW (ref 12.0–15.0)
Immature Granulocytes: 2 %
Lymphocytes Relative: 17 %
Lymphs Abs: 2.3 10*3/uL (ref 0.7–4.0)
MCH: 33.3 pg (ref 26.0–34.0)
MCHC: 34.7 g/dL (ref 30.0–36.0)
MCV: 96 fL (ref 80.0–100.0)
Monocytes Absolute: 0.5 10*3/uL (ref 0.1–1.0)
Monocytes Relative: 3 %
Neutro Abs: 10.4 10*3/uL — ABNORMAL HIGH (ref 1.7–7.7)
Neutrophils Relative %: 76 %
Platelets: 211 10*3/uL (ref 150–400)
RBC: 2.25 MIL/uL — ABNORMAL LOW (ref 3.87–5.11)
RDW: 13.2 % (ref 11.5–15.5)
WBC: 13.7 10*3/uL — ABNORMAL HIGH (ref 4.0–10.5)
nRBC: 0.2 % (ref 0.0–0.2)

## 2021-07-04 LAB — PREPARE RBC (CROSSMATCH)

## 2021-07-04 SURGERY — DILATION AND EVACUATION, UTERUS
Anesthesia: General

## 2021-07-04 MED ORDER — FENTANYL CITRATE (PF) 250 MCG/5ML IJ SOLN
INTRAMUSCULAR | Status: DC | PRN
  Administered 2021-07-04: 50 ug via INTRAVENOUS
  Administered 2021-07-04: 100 ug via INTRAVENOUS

## 2021-07-04 MED ORDER — OXYCODONE HCL 5 MG PO TABS: 5.0000 mg | ORAL_TABLET | Freq: Once | ORAL | Status: DC | PRN

## 2021-07-04 MED ORDER — MIDAZOLAM HCL 2 MG/2ML IJ SOLN
INTRAMUSCULAR | Status: AC
  Filled 2021-07-04: qty 2

## 2021-07-04 MED ORDER — KETOROLAC TROMETHAMINE 30 MG/ML IJ SOLN
INTRAMUSCULAR | Status: DC | PRN
  Administered 2021-07-04: 30 mg via INTRAVENOUS

## 2021-07-04 MED ORDER — PROPOFOL 10 MG/ML IV BOLUS
INTRAVENOUS | Status: DC | PRN
  Administered 2021-07-04: 130 mg via INTRAVENOUS

## 2021-07-04 MED ORDER — LIDOCAINE HCL 1 % IJ SOLN
INTRAMUSCULAR | Status: DC | PRN
  Administered 2021-07-04: 7 mL

## 2021-07-04 MED ORDER — SODIUM CHLORIDE 0.9 % IV SOLN
3.0000 g | Freq: Once | INTRAVENOUS | Status: AC
  Administered 2021-07-04: 3 g via INTRAVENOUS
  Filled 2021-07-04: qty 3

## 2021-07-04 MED ORDER — FENTANYL CITRATE (PF) 100 MCG/2ML IJ SOLN: 25.0000 ug | INTRAMUSCULAR | Status: DC | PRN

## 2021-07-04 MED ORDER — LACTATED RINGERS IV SOLN: INTRAVENOUS | Status: DC

## 2021-07-04 MED ORDER — LIDOCAINE HCL (PF) 1 % IJ SOLN
INTRAMUSCULAR | Status: AC
  Filled 2021-07-04: qty 30

## 2021-07-04 MED ORDER — ACETAMINOPHEN 160 MG/5ML PO SOLN: 1000.0000 mg | Freq: Once | ORAL | Status: DC | PRN

## 2021-07-04 MED ORDER — TRANEXAMIC ACID-NACL 1000-0.7 MG/100ML-% IV SOLN
INTRAVENOUS | Status: DC | PRN
  Administered 2021-07-04: 1000 mg via INTRAVENOUS

## 2021-07-04 MED ORDER — ACETAMINOPHEN 10 MG/ML IV SOLN: 1000.0000 mg | Freq: Once | INTRAVENOUS | Status: DC | PRN

## 2021-07-04 MED ORDER — TRANEXAMIC ACID-NACL 1000-0.7 MG/100ML-% IV SOLN
INTRAVENOUS | Status: AC
  Filled 2021-07-04: qty 100

## 2021-07-04 MED ORDER — MIDAZOLAM HCL 5 MG/5ML IJ SOLN
INTRAMUSCULAR | Status: DC | PRN
  Administered 2021-07-04: 2 mg via INTRAVENOUS

## 2021-07-04 MED ORDER — SODIUM CHLORIDE 0.9% IV SOLUTION: Freq: Once | INTRAVENOUS | Status: DC

## 2021-07-04 MED ORDER — HYDROMORPHONE HCL 1 MG/ML IJ SOLN
1.0000 mg | Freq: Once | INTRAMUSCULAR | Status: AC
  Administered 2021-07-04: 1 mg via INTRAVENOUS
  Filled 2021-07-04: qty 1

## 2021-07-04 MED ORDER — ACETAMINOPHEN 500 MG PO TABS: 1000.0000 mg | ORAL_TABLET | Freq: Once | ORAL | Status: DC | PRN

## 2021-07-04 MED ORDER — DEXAMETHASONE SODIUM PHOSPHATE 10 MG/ML IJ SOLN
INTRAMUSCULAR | Status: DC | PRN
  Administered 2021-07-04: 10 mg via INTRAVENOUS

## 2021-07-04 MED ORDER — LACTATED RINGERS IV SOLN: INTRAVENOUS | Status: DC | PRN

## 2021-07-04 MED ORDER — LACTATED RINGERS IV BOLUS
1000.0000 mL | Freq: Once | INTRAVENOUS | Status: AC
  Administered 2021-07-04: 1000 mL via INTRAVENOUS

## 2021-07-04 MED ORDER — 0.9 % SODIUM CHLORIDE (POUR BTL) OPTIME
TOPICAL | Status: DC | PRN
  Administered 2021-07-04: 1000 mL

## 2021-07-04 MED ORDER — FENTANYL CITRATE (PF) 250 MCG/5ML IJ SOLN
INTRAMUSCULAR | Status: AC
  Filled 2021-07-04: qty 5

## 2021-07-04 MED ORDER — LIDOCAINE HCL (CARDIAC) PF 100 MG/5ML IV SOSY
PREFILLED_SYRINGE | INTRAVENOUS | Status: DC | PRN
  Administered 2021-07-04: 60 mg via INTRATRACHEAL

## 2021-07-04 MED ORDER — OXYCODONE HCL 5 MG/5ML PO SOLN: 5.0000 mg | Freq: Once | ORAL | Status: DC | PRN

## 2021-07-04 MED ORDER — ONDANSETRON HCL 4 MG/2ML IJ SOLN
INTRAMUSCULAR | Status: DC | PRN
  Administered 2021-07-04: 4 mg via INTRAVENOUS

## 2021-07-04 MED ORDER — PROPOFOL 10 MG/ML IV BOLUS
INTRAVENOUS | Status: AC
  Filled 2021-07-04: qty 40

## 2021-07-04 SURGICAL SUPPLY — 20 items
CATH ROBINSON RED A/P 16FR (CATHETERS) ×2 IMPLANT
DECANTER SPIKE VIAL GLASS SM (MISCELLANEOUS) ×2 IMPLANT
FILTER UTR ASPR ASSEMBLY (MISCELLANEOUS) ×2 IMPLANT
GLOVE SURG ENC MOIS LTX SZ6.5 (GLOVE) ×2 IMPLANT
GLOVE SURG UNDER POLY LF SZ6.5 (GLOVE) ×2 IMPLANT
GLOVE SURG UNDER POLY LF SZ7 (GLOVE) ×2 IMPLANT
GOWN STRL REUS W/ TWL LRG LVL3 (GOWN DISPOSABLE) ×2 IMPLANT
GOWN STRL REUS W/TWL LRG LVL3 (GOWN DISPOSABLE) ×2
HOSE CONNECTING 18IN BERKELEY (TUBING) ×2 IMPLANT
KIT BERKELEY 1ST TRI 3/8 NO TR (MISCELLANEOUS) ×2 IMPLANT
KIT BERKELEY 1ST TRIMESTER 3/8 (MISCELLANEOUS) ×2 IMPLANT
NS IRRIG 1000ML POUR BTL (IV SOLUTION) ×2 IMPLANT
PACK VAGINAL MINOR WOMEN LF (CUSTOM PROCEDURE TRAY) ×2 IMPLANT
PAD OB MATERNITY 4.3X12.25 (PERSONAL CARE ITEMS) ×2 IMPLANT
SET BERKELEY SUCTION TUBING (SUCTIONS) ×2 IMPLANT
TOWEL GREEN STERILE FF (TOWEL DISPOSABLE) ×4 IMPLANT
VACURETTE 10 RIGID CVD (CANNULA) IMPLANT
VACURETTE 7MM CVD STRL WRAP (CANNULA) IMPLANT
VACURETTE 8 RIGID CVD (CANNULA) ×2 IMPLANT
VACURETTE 9 RIGID CVD (CANNULA) IMPLANT

## 2021-07-04 NOTE — Anesthesia Procedure Notes (Signed)
Procedure Name: LMA Insertion Date/Time: 07/04/2021 5:30 PM Performed by: Clovis Cao, CRNA Pre-anesthesia Checklist: Patient identified, Emergency Drugs available, Suction available and Patient being monitored Patient Re-evaluated:Patient Re-evaluated prior to induction Oxygen Delivery Method: Circle system utilized Preoxygenation: Pre-oxygenation with 100% oxygen Induction Type: IV induction Ventilation: Mask ventilation without difficulty LMA: LMA inserted LMA Size: 4.0 Number of attempts: 1 Placement Confirmation: positive ETCO2 and breath sounds checked- equal and bilateral Tube secured with: Tape Dental Injury: Teeth and Oropharynx as per pre-operative assessment

## 2021-07-04 NOTE — MAU Note (Signed)
Pt reports to mau with c/o lower abd cramping and heavy vag bleeding that started a few hours ago.  Pt reports passing golf ball sized clots.  Reports she feels dizzy

## 2021-07-04 NOTE — Op Note (Signed)
Operative Note    Preoperative Diagnosis Retained products of conception s/p 15 week IUFD and delivery Acute blood loss anemia  Postoperative Diagnosis Same  Procedure Suction Dilation and Curettage  Surgeon Paula Compton, MD  Anesthesia LMA  Fluids: EBL 226m in OR  UOP straight cath prior to procedure IVF 10026mLR  Findings A large amount of products of conception/retained placenta, partially protruding through a dilated os. Removed with ring forceps and additional tissue obtained on suction passes  Specimen Retained placental tissue  Procedure Note  Pt was taken to operating room where LMA anesthesia was obtained without difficulty.  She was prepped and draped in the normal sterile fashion in the dorsal lithotomy position.  An appropriate timeout was performed.  A speculum was placed in the vagina and a piece of placental tissue noted to be protruding from the cervical os.  This was teased out with ring forceps and some bleeding followed removal. 2cc of 1% plain lidocaine injected into the anterior cervix.  An additional 9cc was placed both at 2 and 10 o'clock for a paracervical block.  A ring forcep grasped the anterior lip of the dilated cervix,  and the 65m26muction curette was introduced into the uterine fundus.  A moderate amount of retained tissue removed in several passes.  A sharp curettage was performed as well as one additional suction pass and no further tissue forthcoming. The ring was removed the uterus massaged with good contraction noted.  Given her heavier blood loss, TXA was given.   All instruments and sponges were removed from vagina and the patient awakened and taken to the PACU in good condition.   I-stat on leaving the OR showed a hgb of 7.5.  She will be observed in PACU to see how her anemia tolerated on awakening to see if needs transfusion.

## 2021-07-04 NOTE — MAU Provider Note (Signed)
History     CSN: [id number]  Arrival date and time: 07/04/21 1442   Event Date/Time   First Provider Initiated Contact with Patient 07/04/21 1458      Chief Complaint  Patient presents with   Abdominal Pain   Vaginal Bleeding   HPI Shelly Adams is a 30 y.o. [id number] who presents with vaginal bleeding and pain. She delivered a 16 week IUFD on 7/6. She was discharged home on 7/7 and states around 11am she started having heavy bleeding with clots. She also reports cramping that she rates a 10/10.  Upon arrival, patient writhing in pain and grey in color. Small amount of bright red blood on pad. CNM met patient at bedside upon arrival.   OB History     Gravida  4   Para  3   Term  3   Preterm      AB  1   Living  3      SAB      IAB      Ectopic      Multiple  0   Live Births  3           Past Medical History:  Diagnosis Date   Anxiety    Asthma    Benign essential hypertension, antepartum 07/29/2013   On propranolol-->labetalol [x]  Aspirin 81 mg daily after 12 weeks; discontinue after 36 weeks Current antihypertensives:  Labetalol   Baseline and surveillance labs (pulled in from [facility], refresh links as needed)  Lab Results Component Value Date  PLT 198 10/17/2016  CREATININE 0.82 07/05/2016  AST 20 07/05/2016  ALT 25 07/05/2016  PROTCRRATIO 1.11 (H) 08/29/2013  PROTEIN24HR 231 (H) 08/23/2013 Plt 0.52 Pr:Cr 108  Antenatal Testing CHTN - O10.919  Group I  BP < 140/90, no preeclampsia, AGA,  nml AFV, +/- meds    Group II BP > 140/90, on meds, no preeclampsia, AGA, nml AFV  20-28-34-38  20-24-28-32-35-38  32//2 x wk  28//BPP wkly then 32//2 x wk  40 no meds; 39 meds  PRN or 37 Pre-eclampsia  GHTN - O13.9/Preeclampsia without severe features  - O14.00   Preeclampsia with severe features - O14.10  Q 3-4wks  Q 2 wks  28//BPP wkly then 32//2 x wk  Inpatient  37  PRN or 34     Fatty liver disease, nonalcoholic    Fibroid, uterine 08/01/2013   GBS (group B Streptococcus  carrier), +RV culture, currently pregnant 03/05/2017   GDM (gestational diabetes mellitus) 07/23/2018   Headache    Hyperlipemia    Insomnia    Normal delivery 2014   And 2018   Recurrent kidney stones    history of kidney stones   Steatosis of liver suggested by ultrasound 10/31/2016    Past Surgical History:  Procedure Laterality Date   BREAST REDUCTION SURGERY  2009    Family History  Problem Relation Age of Onset   Hypertension Mother    Hyperlipidemia Mother    GER disease Mother    Hyperlipidemia Father    Hypertension Father    Colon cancer Neg Hx     Social History   Tobacco Use   Smoking status: Never   Smokeless tobacco: Never  Vaping Use   Vaping Use: Never used  Substance Use Topics   Alcohol use: No   Drug use: No    Allergies:  Allergies  Allergen Reactions   Metoprolol Shortness Of Breath   Clonidine Other (See Comments)    fatigue  Tramadol Itching and Rash    Medications Prior to Admission  Medication Sig Dispense Refill Last Dose   acetaminophen (TYLENOL) 500 MG tablet Take 500 mg by mouth every 6 (six) hours as needed for mild pain or headache.      ibuprofen (ADVIL) 600 MG tablet Take 1 tablet (600 mg total) by mouth every 6 (six) hours as needed for moderate pain. 30 tablet 1    Prenatal Vit-Fe Fumarate-FA (PRENATAL MULTIVITAMIN) TABS tablet Take 1 tablet by mouth daily at 12 noon.       Review of Systems  Constitutional: Negative.  Negative for fatigue and fever.  HENT: Negative.    Respiratory: Negative.  Negative for shortness of breath.   Cardiovascular: Negative.  Negative for chest pain.  Gastrointestinal:  Positive for abdominal pain. Negative for constipation, diarrhea, nausea and vomiting.  Genitourinary:  Positive for vaginal bleeding. Negative for dysuria.  Neurological:  Positive for dizziness, weakness and light-headedness. Negative for headaches.  Physical Exam   Blood pressure 127/81, pulse 91, temperature 97.8 F  (36.6 C), temperature source Oral, resp. rate 18, SpO2 100 %, unknown if currently breastfeeding.  Physical Exam Vitals and nursing note reviewed.  Constitutional:      General: She is not in acute distress.    Appearance: She is well-developed.  HENT:     Head: Normocephalic.  Eyes:     Pupils: Pupils are equal, round, and reactive to light.  Cardiovascular:     Rate and Rhythm: Normal rate and regular rhythm.     Heart sounds: Normal heart sounds.  Pulmonary:     Effort: Pulmonary effort is normal. No respiratory distress.     Breath sounds: Normal breath sounds.  Abdominal:     General: Bowel sounds are normal. There is no distension.     Palpations: Abdomen is soft.     Tenderness: There is abdominal tenderness in the right lower quadrant, suprapubic area and left lower quadrant. There is guarding.  Skin:    General: Skin is warm and dry.  Neurological:     Mental Status: She is alert and oriented to person, place, and time.  Psychiatric:        Mood and Affect: Mood normal.        Behavior: Behavior normal.        Thought Content: Thought content normal.        Judgment: Judgment normal.    MAU Course  Procedures Results for orders placed or performed during the hospital encounter of 07/04/21 (from the past 24 hour(s))  CBC with Differential/Platelet     Status: Abnormal   Collection Time: 07/04/21  2:58 PM  Result Value Ref Range   WBC 13.7 (H) 4.0 - 10.5 K/uL   RBC 2.25 (L) 3.87 - 5.11 MIL/uL   Hemoglobin 7.5 (L) 12.0 - 15.0 g/dL   HCT 21.6 (L) 36.0 - 46.0 %   MCV 96.0 80.0 - 100.0 fL   MCH 33.3 26.0 - 34.0 pg   MCHC 34.7 30.0 - 36.0 g/dL   RDW 13.2 11.5 - 15.5 %   Platelets 211 150 - 400 K/uL   nRBC 0.2 0.0 - 0.2 %   Neutrophils Relative % 76 %   Neutro Abs 10.4 (H) 1.7 - 7.7 K/uL   Lymphocytes Relative 17 %   Lymphs Abs 2.3 0.7 - 4.0 K/uL   Monocytes Relative 3 %   Monocytes Absolute 0.5 0.1 - 1.0 K/uL   Eosinophils Relative 1 %  Eosinophils Absolute  0.1 0.0 - 0.5 K/uL   Basophils Relative 1 %   Basophils Absolute 0.1 0.0 - 0.1 K/uL   Immature Granulocytes 2 %   Abs Immature Granulocytes 0.32 (H) 0.00 - 0.07 K/uL  Comprehensive metabolic panel     Status: Abnormal   Collection Time: 07/04/21  2:58 PM  Result Value Ref Range   Sodium 133 (L) 135 - 145 mmol/L   Potassium 3.5 3.5 - 5.1 mmol/L   Chloride 106 98 - 111 mmol/L   CO2 19 (L) 22 - 32 mmol/L   Glucose, Bld 140 (H) 70 - 99 mg/dL   BUN <5 (L) 6 - 20 mg/dL   Creatinine, Ser 0.63 0.44 - 1.00 mg/dL   Calcium 8.3 (L) 8.9 - 10.3 mg/dL   Total Protein 5.7 (L) 6.5 - 8.1 g/dL   Albumin 2.8 (L) 3.5 - 5.0 g/dL   AST 16 15 - 41 U/L   ALT 13 0 - 44 U/L   Alkaline Phosphatase 44 38 - 126 U/L   Total Bilirubin 0.6 0.3 - 1.2 mg/dL   GFR, Estimated >60 >60 mL/min   Anion gap 8 5 - 15  Type and screen [place]     Status: None   Collection Time: 07/04/21  2:58 PM  Result Value Ref Range   ABO/RH(D) O POS    Antibody Screen NEG    Sample Expiration      07/07/2021,2359 Performed at [facility], [address] [place], [place] [address]     US PELVIC COMPLETE WITH TRANSVAGINAL  Result Date: 07/04/2021 CLINICAL DATA:  Vaginal bleeding and pain. Clinical history includes missed abortion/fetal demise at 16 weeks 3 days ago. EXAM: TRANSABDOMINAL AND TRANSVAGINAL ULTRASOUND OF PELVIS TECHNIQUE: Both transabdominal and transvaginal ultrasound examinations of the pelvis were performed. Transabdominal technique was performed for global imaging of the pelvis including uterus, ovaries, adnexal regions, and pelvic cul-de-sac. It was necessary to proceed with endovaginal exam following the transabdominal exam to visualize the uterus, ovaries, and adnexa. COMPARISON:  09/24/2018 obstetric ultrasound. FINDINGS: Uterus Measurements: 12.5 x 6.7 x 7.5 cm = volume: 315 mL. Heterogeneity within the uterine fundus may relate to small fibroids. Endometrium Lower uterine segment  endometrial mass of 6.4 x 6.7 x 4.6 cm with mild vascularity including superiorly and anteriorly. Example image 51. Right ovary Measurements: 3.0 x 1.3 x 2.5 cm = volume: 5 mL. Normal appearance/no adnexal mass. Left ovary Measurements: 3.5 x 1.6 x 2.7 cm = volume: 7.8 mL. Normal appearance/no adnexal mass. Other findings No significant free fluid. IMPRESSION: Lower uterine segment endometrial mass which, given the clinical history, is consistent with retained products of conception. Possible concurrent uterine fundal fibroids. Electronically Signed   By: Abigail Miyamoto M.D.   On: 07/04/2021 16:10     MDM CBC, CMP, Type and Screen LR bolus Dilaudid IV US Pelvis Comp with Transvaginal  Dr. Marvel Plan called unit and will come see patient to discuss surgical management of retained products of conception. Pelvic exam deferred at this time.  Assessment and Plan  -Possible retained products of conception  -Care turned over to MD in MAU.  Wende Mott CNM 07/04/2021, 2:58 PM

## 2021-07-04 NOTE — Anesthesia Preprocedure Evaluation (Addendum)
Anesthesia Evaluation  Patient identified by MRN, date of birth, ID band Patient awake    Reviewed: Allergy & Precautions, NPO status , Patient's Chart, lab work & pertinent test results  History of Anesthesia Complications Negative for: history of anesthetic complications  Airway Mallampati: II  TM Distance: >3 FB Neck ROM: Full    Dental  (+) Dental Advisory Given, Teeth Intact   Pulmonary neg shortness of breath, asthma , neg sleep apnea, neg COPD, neg recent URI,  Covid-19 Nucleic Acid Test Results Lab Results      Component                Value               Date                      SARSCOV2NAA              NEGATIVE            06/30/2021              breath sounds clear to auscultation       Cardiovascular hypertension,  Rhythm:Regular     Neuro/Psych  Headaches, neg Seizures PSYCHIATRIC DISORDERS Anxiety    GI/Hepatic negative GI ROS, Neg liver ROS,   Endo/Other  diabetes  Renal/GU Renal disease     Musculoskeletal   Abdominal   Peds  Hematology  (+) Blood dyscrasia, anemia , Lab Results      Component                Value               Date                      WBC                      13.7 (H)            07/04/2021                HGB                      7.5 (L)             07/04/2021                HCT                      21.6 (L)            07/04/2021                MCV                      96.0                07/04/2021                PLT                      211                 07/04/2021              Anesthesia Other Findings   Reproductive/Obstetrics Retained products s/p IOL for 15 week fetal demise  Anesthesia Physical Anesthesia Plan  ASA: 2  Anesthesia Plan: General   Post-op Pain Management:    Induction: Intravenous  PONV Risk Score and Plan: 3 and Ondansetron and Dexamethasone  Airway Management Planned: Oral ETT  Additional  Equipment: None  Intra-op Plan:   Post-operative Plan: Extubation in OR  Informed Consent: I have reviewed the patients History and Physical, chart, labs and discussed the procedure including the risks, benefits and alternatives for the proposed anesthesia with the patient or authorized representative who has indicated his/her understanding and acceptance.     Dental advisory given  Plan Discussed with: CRNA and Surgeon  Anesthesia Plan Comments:         Anesthesia Quick Evaluation

## 2021-07-04 NOTE — Transfer of Care (Signed)
Immediate Anesthesia Transfer of Care Note  Patient: [name]  Procedure(s) Performed: DILATATION AND EVACUATION  Patient Location: PACU  Anesthesia Type:General  Level of Consciousness: drowsy  Airway & Oxygen Therapy: Patient Spontanous Breathing  Post-op Assessment: Report given to RN and Post -op Vital signs reviewed and stable  Post vital signs: Reviewed and stable  Last Vitals:  Vitals Value Taken Time  BP 132/61 07/04/21 1808  Temp    Pulse 104 07/04/21 1811  Resp 20 07/04/21 1811  SpO2 100 % 07/04/21 1811  Vitals shown include unvalidated device data.  Last Pain:  Vitals:   07/04/21 1450  TempSrc:   PainSc: 10-Worst pain ever      Patients Stated Pain Goal: 0 (78/47/84 1282)  Complications: No notable events documented.

## 2021-07-04 NOTE — Progress Notes (Signed)
In PACU attempted to get pt up and became orthostatic with tachycardia and drop in BP.  Will proceed with transfusion of one unit of PRBC's and reassess after that unit to see if needs second one.

## 2021-07-04 NOTE — Progress Notes (Signed)
Attempted to sit patient on the side of the bed. Patient became dizzy, light headed, blood pressure dropped to 94/74 and heart rate was 129. Marvel Plan MD notified and aware of hemoglobin 7.5. Transfuse 1 unit of RBC ordered and will be started in PACU. Husband Josh signed verbal consent for the transfusion.

## 2021-07-04 NOTE — Progress Notes (Signed)
[place] Dr. Marvel Plan in attendance at  bedside to speak with patient. Plan of care is for patient to go to the OR. Patient verbalized understanding. Consent obtained 1700 OR team in attendance at bedside. Patient verbally identified and transported to OR via stretcher.

## 2021-07-04 NOTE — H&P (Signed)
Shelly Adams is an 30 y.o. female [phone] s/p a 15 week fetal demise and IOL on  06/30/21 with delayed delivery of placenta, felt to be intact at time.  At 1100am today, reports began to have severe cramping and heavy vaginal bleeding passing golf ball sized clots.  Called office and advised to come to hospital.  Vitals stable on arrival but hgb dropped to 7.5 from 10.1 at discharge and US reveals 6cm mass c/w probable retained placental tissue in LUS.  Pt received dilaudid for her cramping.   OB History: Term NSVD x 3                     Vaginal IOL and delivery of 15 week demise as above    Menstrual History:  No LMP recorded.    Past Medical History:  Diagnosis Date   Anxiety    Asthma    Benign essential hypertension, antepartum 07/29/2013   On propranolol-->labetalol [x]  Aspirin 81 mg daily after 12 weeks; discontinue after 36 weeks Current antihypertensives:  Labetalol   Baseline and surveillance labs (pulled in from [facility], refresh links as needed)  Lab Results Component Value Date  PLT 198 10/17/2016  CREATININE 0.82 07/05/2016  AST 20 07/05/2016  ALT 25 07/05/2016  PROTCRRATIO 1.11 (H) 08/29/2013  PROTEIN24HR 231 (H) 08/23/2013 Plt 0.52 Pr:Cr 108  Antenatal Testing CHTN - O10.919  Group I  BP < 140/90, no preeclampsia, AGA,  nml AFV, +/- meds    Group II BP > 140/90, on meds, no preeclampsia, AGA, nml AFV  20-28-34-38  20-24-28-32-35-38  32//2 x wk  28//BPP wkly then 32//2 x wk  40 no meds; 39 meds  PRN or 37 Pre-eclampsia  GHTN - O13.9/Preeclampsia without severe features  - O14.00   Preeclampsia with severe features - O14.10  Q 3-4wks  Q 2 wks  28//BPP wkly then 32//2 x wk  Inpatient  37  PRN or 34     Fatty liver disease, nonalcoholic    Fibroid, uterine 08/01/2013   GBS (group B Streptococcus carrier), +RV culture, currently pregnant 03/05/2017   GDM (gestational diabetes mellitus) 07/23/2018   Headache    Hyperlipemia    Insomnia    Normal delivery 2014   And 2018   Recurrent kidney  stones    history of kidney stones   Steatosis of liver suggested by ultrasound 10/31/2016    Past Surgical History:  Procedure Laterality Date   BREAST REDUCTION SURGERY  2009    Family History  Problem Relation Age of Onset   Hypertension Mother    Hyperlipidemia Mother    GER disease Mother    Hyperlipidemia Father    Hypertension Father    Colon cancer Neg Hx     Social History:  reports that she has never smoked. She has never used smokeless tobacco. She reports that she does not drink alcohol and does not use drugs.  Allergies:  Allergies  Allergen Reactions   Metoprolol Shortness Of Breath   Clonidine Other (See Comments)    fatigue   Tramadol Itching and Rash    Medications Prior to Admission  Medication Sig Dispense Refill Last Dose   acetaminophen (TYLENOL) 500 MG tablet Take 500 mg by mouth every 6 (six) hours as needed for mild pain or headache.      ibuprofen (ADVIL) 600 MG tablet Take 1 tablet (600 mg total) by mouth every 6 (six) hours as needed for moderate pain. 30 tablet 1  Prenatal Vit-Fe Fumarate-FA (PRENATAL MULTIVITAMIN) TABS tablet Take 1 tablet by mouth daily at 12 noon.       Review of Systems  Gastrointestinal:  Positive for abdominal pain.  Genitourinary:  Positive for vaginal bleeding.   Blood pressure 127/81, pulse 91, temperature 97.8 F (36.6 C), temperature source Oral, resp. rate 18, SpO2 100 %, unknown if currently breastfeeding. Physical Exam Cardiovascular:     Rate and Rhythm: Normal rate and regular rhythm.  Pulmonary:     Effort: Pulmonary effort is normal.  Abdominal:     Palpations: Abdomen is soft.  Genitourinary:    Vagina: Normal.     Uterus: Normal. Tender.   Neurological:     Mental Status: She is alert.    Results for orders placed or performed during the hospital encounter of 07/04/21 (from the past 24 hour(s))  CBC with Differential/Platelet     Status: Abnormal   Collection Time: 07/04/21  2:58 PM  Result  Value Ref Range   WBC 13.7 (H) 4.0 - 10.5 K/uL   RBC 2.25 (L) 3.87 - 5.11 MIL/uL   Hemoglobin 7.5 (L) 12.0 - 15.0 g/dL   HCT 21.6 (L) 36.0 - 46.0 %   MCV 96.0 80.0 - 100.0 fL   MCH 33.3 26.0 - 34.0 pg   MCHC 34.7 30.0 - 36.0 g/dL   RDW 13.2 11.5 - 15.5 %   Platelets 211 150 - 400 K/uL   nRBC 0.2 0.0 - 0.2 %   Neutrophils Relative % 76 %   Neutro Abs 10.4 (H) 1.7 - 7.7 K/uL   Lymphocytes Relative 17 %   Lymphs Abs 2.3 0.7 - 4.0 K/uL   Monocytes Relative 3 %   Monocytes Absolute 0.5 0.1 - 1.0 K/uL   Eosinophils Relative 1 %   Eosinophils Absolute 0.1 0.0 - 0.5 K/uL   Basophils Relative 1 %   Basophils Absolute 0.1 0.0 - 0.1 K/uL   Immature Granulocytes 2 %   Abs Immature Granulocytes 0.32 (H) 0.00 - 0.07 K/uL  Comprehensive metabolic panel     Status: Abnormal   Collection Time: 07/04/21  2:58 PM  Result Value Ref Range   Sodium 133 (L) 135 - 145 mmol/L   Potassium 3.5 3.5 - 5.1 mmol/L   Chloride 106 98 - 111 mmol/L   CO2 19 (L) 22 - 32 mmol/L   Glucose, Bld 140 (H) 70 - 99 mg/dL   BUN <5 (L) 6 - 20 mg/dL   Creatinine, Ser 0.63 0.44 - 1.00 mg/dL   Calcium 8.3 (L) 8.9 - 10.3 mg/dL   Total Protein 5.7 (L) 6.5 - 8.1 g/dL   Albumin 2.8 (L) 3.5 - 5.0 g/dL   AST 16 15 - 41 U/L   ALT 13 0 - 44 U/L   Alkaline Phosphatase 44 38 - 126 U/L   Total Bilirubin 0.6 0.3 - 1.2 mg/dL   GFR, Estimated >60 >60 mL/min   Anion gap 8 5 - 15  Type and screen [place]     Status: None   Collection Time: 07/04/21  2:58 PM  Result Value Ref Range   ABO/RH(D) O POS    Antibody Screen NEG    Sample Expiration      07/07/2021,2359 Performed at [facility], [address], [place], [place] [address]     US PELVIC COMPLETE WITH TRANSVAGINAL  Result Date: 07/04/2021 CLINICAL DATA:  Vaginal bleeding and pain. Clinical history includes missed abortion/fetal demise at 95  weeks 3 days ago. EXAM: TRANSABDOMINAL AND TRANSVAGINAL ULTRASOUND OF PELVIS TECHNIQUE: Both  transabdominal and transvaginal ultrasound examinations of the pelvis were performed. Transabdominal technique was performed for global imaging of the pelvis including uterus, ovaries, adnexal regions, and pelvic cul-de-sac. It was necessary to proceed with endovaginal exam following the transabdominal exam to visualize the uterus, ovaries, and adnexa. COMPARISON:  09/24/2018 obstetric ultrasound. FINDINGS: Uterus Measurements: 12.5 x 6.7 x 7.5 cm = volume: 315 mL. Heterogeneity within the uterine fundus may relate to small fibroids. Endometrium Lower uterine segment endometrial mass of 6.4 x 6.7 x 4.6 cm with mild vascularity including superiorly and anteriorly. Example image 51. Right ovary Measurements: 3.0 x 1.3 x 2.5 cm = volume: 5 mL. Normal appearance/no adnexal mass. Left ovary Measurements: 3.5 x 1.6 x 2.7 cm = volume: 7.8 mL. Normal appearance/no adnexal mass. Other findings No significant free fluid. IMPRESSION: Lower uterine segment endometrial mass which, given the clinical history, is consistent with retained products of conception. Possible concurrent uterine fundal fibroids. Electronically Signed   By: Abigail Miyamoto M.D.   On: 07/04/2021 16:10    Assessment/Plan: D/w pt findings on [place] c/w a good amount of retained tissue and given her drop in hgb and pain I feel we need to proceed with D&E to remove the tissue.  She is more comfortable s/p dilaudid and bleeding has slowed down for the moment.  We discussed risks and benefits of procedure including bleeding, infection and possible uterine perforation.  We also discussed that she is likely more anemic than her initial CBC demonstrates given she was symptomatic and that there is a good possibility she will need a blood transfusion.  We discussed the risks and benefits of this and she is agreeable.  If she remains stable in OR, we will check blood count in PACU and determine if needs to stay overnight for transfusion.  Pt and husband agreeable to  plan.  Logan Bores 07/04/2021, 4:15 PM

## 2021-07-05 ENCOUNTER — Encounter (HOSPITAL_COMMUNITY): Payer: Self-pay | Admitting: Obstetrics and Gynecology

## 2021-07-05 LAB — POCT I-STAT, CHEM 8
BUN: <3 mg/dL — ABNORMAL LOW (ref 6–20)
Calcium, Ion: 1.23 mmol/L (ref 1.15–1.40)
Chloride: 105 mmol/L (ref 98–111)
Creatinine, Ser: 0.3 mg/dL — ABNORMAL LOW (ref 0.44–1.00)
Glucose, Bld: 102 mg/dL — ABNORMAL HIGH (ref 70–99)
HCT: 22 % — ABNORMAL LOW (ref 36.0–46.0)
Hemoglobin: 7.5 g/dL — ABNORMAL LOW (ref 12.0–15.0)
Potassium: 3.8 mmol/L (ref 3.5–5.1)
Sodium: 135 mmol/L (ref 135–145)
TCO2: 22 mmol/L (ref 22–32)

## 2021-07-05 LAB — TYPE AND SCREEN
ABO/RH(D): O POS
Antibody Screen: NEGATIVE
Unit division: 0
Unit division: 0
Unit division: 0

## 2021-07-05 LAB — BPAM RBC
Blood Product Expiration Date: 202208092359
Blood Product Expiration Date: 202208092359
Blood Product Expiration Date: 202208102359
ISSUE DATE / TIME: 202207101708
ISSUE DATE / TIME: 202207101708
Unit Type and Rh: 5100
Unit Type and Rh: 5100
Unit Type and Rh: 5100

## 2021-07-05 LAB — SURGICAL PATHOLOGY

## 2021-07-05 NOTE — Anesthesia Postprocedure Evaluation (Signed)
Anesthesia Post Note  Patient: Shelly Adams  Procedure(s) Performed: DILATATION AND EVACUATION     Patient location during evaluation: PACU Anesthesia Type: General Level of consciousness: awake and alert Pain management: pain level controlled Vital Signs Assessment: post-procedure vital signs reviewed and stable Respiratory status: spontaneous breathing, nonlabored ventilation, respiratory function stable and patient connected to nasal cannula oxygen Cardiovascular status: blood pressure returned to baseline and stable Postop Assessment: no apparent nausea or vomiting Anesthetic complications: no   No notable events documented.  Last Vitals:  Vitals:   07/04/21 2055 07/04/21 2110  BP: 120/78 119/80  Pulse:    Resp: 16 (!) 21  Temp: 36.7 C   SpO2: 100%     Last Pain:  Vitals:   07/04/21 2100  TempSrc:   PainSc: 0-No pain                 Chanan Detwiler

## 2021-07-06 LAB — SURGICAL PATHOLOGY

## 2021-08-25 DIAGNOSIS — F411 Generalized anxiety disorder: Secondary | ICD-10-CM | POA: Diagnosis not present

## 2021-08-26 DIAGNOSIS — N926 Irregular menstruation, unspecified: Secondary | ICD-10-CM | POA: Diagnosis not present

## 2021-09-07 DIAGNOSIS — R1011 Right upper quadrant pain: Secondary | ICD-10-CM | POA: Diagnosis not present

## 2021-09-14 DIAGNOSIS — K8020 Calculus of gallbladder without cholecystitis without obstruction: Secondary | ICD-10-CM | POA: Diagnosis not present

## 2021-09-14 DIAGNOSIS — R1011 Right upper quadrant pain: Secondary | ICD-10-CM | POA: Diagnosis not present

## 2021-09-27 DIAGNOSIS — Z319 Encounter for procreative management, unspecified: Secondary | ICD-10-CM | POA: Diagnosis not present

## 2021-12-06 DIAGNOSIS — F331 Major depressive disorder, recurrent, moderate: Secondary | ICD-10-CM | POA: Diagnosis not present

## 2021-12-13 DIAGNOSIS — E781 Pure hyperglyceridemia: Secondary | ICD-10-CM | POA: Diagnosis not present

## 2021-12-13 DIAGNOSIS — R7301 Impaired fasting glucose: Secondary | ICD-10-CM | POA: Diagnosis not present

## 2021-12-13 DIAGNOSIS — R079 Chest pain, unspecified: Secondary | ICD-10-CM | POA: Diagnosis not present

## 2021-12-13 DIAGNOSIS — R002 Palpitations: Secondary | ICD-10-CM | POA: Diagnosis not present

## 2021-12-13 DIAGNOSIS — F419 Anxiety disorder, unspecified: Secondary | ICD-10-CM | POA: Diagnosis not present

## 2021-12-13 DIAGNOSIS — I10 Essential (primary) hypertension: Secondary | ICD-10-CM | POA: Diagnosis not present

## 2021-12-13 DIAGNOSIS — Z6830 Body mass index (BMI) 30.0-30.9, adult: Secondary | ICD-10-CM | POA: Diagnosis not present

## 2021-12-13 DIAGNOSIS — E6609 Other obesity due to excess calories: Secondary | ICD-10-CM | POA: Diagnosis not present

## 2021-12-14 ENCOUNTER — Inpatient Hospital Stay (HOSPITAL_COMMUNITY): Admit: 2021-12-14 | Payer: Self-pay

## 2021-12-14 DIAGNOSIS — R002 Palpitations: Secondary | ICD-10-CM | POA: Diagnosis not present

## 2021-12-22 DIAGNOSIS — F331 Major depressive disorder, recurrent, moderate: Secondary | ICD-10-CM | POA: Diagnosis not present

## 2022-01-04 DIAGNOSIS — R079 Chest pain, unspecified: Secondary | ICD-10-CM | POA: Diagnosis not present

## 2022-01-04 DIAGNOSIS — R002 Palpitations: Secondary | ICD-10-CM | POA: Diagnosis not present

## 2022-06-09 DIAGNOSIS — Z6830 Body mass index (BMI) 30.0-30.9, adult: Secondary | ICD-10-CM | POA: Diagnosis not present

## 2022-06-09 DIAGNOSIS — D509 Iron deficiency anemia, unspecified: Secondary | ICD-10-CM | POA: Diagnosis not present

## 2022-06-09 DIAGNOSIS — R7301 Impaired fasting glucose: Secondary | ICD-10-CM | POA: Diagnosis not present

## 2022-06-09 DIAGNOSIS — Z0000 Encounter for general adult medical examination without abnormal findings: Secondary | ICD-10-CM | POA: Diagnosis not present

## 2022-06-09 DIAGNOSIS — E6609 Other obesity due to excess calories: Secondary | ICD-10-CM | POA: Diagnosis not present

## 2022-06-09 DIAGNOSIS — I10 Essential (primary) hypertension: Secondary | ICD-10-CM | POA: Diagnosis not present

## 2022-09-22 DIAGNOSIS — F900 Attention-deficit hyperactivity disorder, predominantly inattentive type: Secondary | ICD-10-CM | POA: Diagnosis not present

## 2022-10-05 DIAGNOSIS — E781 Pure hyperglyceridemia: Secondary | ICD-10-CM | POA: Diagnosis not present

## 2022-11-08 DIAGNOSIS — Z3A01 Less than 8 weeks gestation of pregnancy: Secondary | ICD-10-CM | POA: Diagnosis not present

## 2022-11-08 DIAGNOSIS — O200 Threatened abortion: Secondary | ICD-10-CM | POA: Diagnosis not present

## 2022-11-09 DIAGNOSIS — O200 Threatened abortion: Secondary | ICD-10-CM | POA: Diagnosis not present

## 2022-11-09 DIAGNOSIS — Z3A01 Less than 8 weeks gestation of pregnancy: Secondary | ICD-10-CM | POA: Diagnosis not present

## 2022-11-15 DIAGNOSIS — O4691 Antepartum hemorrhage, unspecified, first trimester: Secondary | ICD-10-CM | POA: Diagnosis not present

## 2022-11-15 DIAGNOSIS — Z3A08 8 weeks gestation of pregnancy: Secondary | ICD-10-CM | POA: Diagnosis not present

## 2022-11-15 DIAGNOSIS — Z3201 Encounter for pregnancy test, result positive: Secondary | ICD-10-CM | POA: Diagnosis not present

## 2022-11-15 DIAGNOSIS — O200 Threatened abortion: Secondary | ICD-10-CM | POA: Diagnosis not present

## 2022-11-15 DIAGNOSIS — O3680X1 Pregnancy with inconclusive fetal viability, fetus 1: Secondary | ICD-10-CM | POA: Diagnosis not present

## 2022-11-21 DIAGNOSIS — Z3A09 9 weeks gestation of pregnancy: Secondary | ICD-10-CM | POA: Diagnosis not present

## 2022-11-21 DIAGNOSIS — O26891 Other specified pregnancy related conditions, first trimester: Secondary | ICD-10-CM | POA: Diagnosis not present

## 2022-11-21 DIAGNOSIS — O021 Missed abortion: Secondary | ICD-10-CM | POA: Diagnosis not present

## 2022-11-21 DIAGNOSIS — Z8632 Personal history of gestational diabetes: Secondary | ICD-10-CM | POA: Diagnosis not present

## 2022-11-21 DIAGNOSIS — Z8679 Personal history of other diseases of the circulatory system: Secondary | ICD-10-CM | POA: Diagnosis not present

## 2022-11-28 DIAGNOSIS — L282 Other prurigo: Secondary | ICD-10-CM | POA: Diagnosis not present

## 2022-11-29 DIAGNOSIS — O021 Missed abortion: Secondary | ICD-10-CM | POA: Diagnosis not present

## 2023-02-23 DIAGNOSIS — D509 Iron deficiency anemia, unspecified: Secondary | ICD-10-CM | POA: Diagnosis not present

## 2023-06-26 DIAGNOSIS — Z3201 Encounter for pregnancy test, result positive: Secondary | ICD-10-CM | POA: Diagnosis not present

## 2023-07-06 DIAGNOSIS — Z8679 Personal history of other diseases of the circulatory system: Secondary | ICD-10-CM | POA: Diagnosis not present

## 2023-07-06 DIAGNOSIS — Z3201 Encounter for pregnancy test, result positive: Secondary | ICD-10-CM | POA: Diagnosis not present

## 2023-07-10 DIAGNOSIS — O3680X1 Pregnancy with inconclusive fetal viability, fetus 1: Secondary | ICD-10-CM | POA: Diagnosis not present

## 2023-07-10 DIAGNOSIS — Z3A11 11 weeks gestation of pregnancy: Secondary | ICD-10-CM | POA: Diagnosis not present

## 2023-07-17 DIAGNOSIS — Z3481 Encounter for supervision of other normal pregnancy, first trimester: Secondary | ICD-10-CM | POA: Diagnosis not present

## 2023-07-17 DIAGNOSIS — Z368A Encounter for antenatal screening for other genetic defects: Secondary | ICD-10-CM | POA: Diagnosis not present

## 2023-07-17 DIAGNOSIS — Z113 Encounter for screening for infections with a predominantly sexual mode of transmission: Secondary | ICD-10-CM | POA: Diagnosis not present

## 2023-07-17 DIAGNOSIS — Z3A12 12 weeks gestation of pregnancy: Secondary | ICD-10-CM | POA: Diagnosis not present

## 2023-07-17 DIAGNOSIS — Z369 Encounter for antenatal screening, unspecified: Secondary | ICD-10-CM | POA: Diagnosis not present

## 2023-07-17 DIAGNOSIS — Z3A01 Less than 8 weeks gestation of pregnancy: Secondary | ICD-10-CM | POA: Diagnosis not present

## 2023-07-17 DIAGNOSIS — Z3401 Encounter for supervision of normal first pregnancy, first trimester: Secondary | ICD-10-CM | POA: Diagnosis not present

## 2023-07-17 LAB — OB RESULTS CONSOLE RUBELLA ANTIBODY, IGM: Rubella: IMMUNE

## 2023-07-17 LAB — OB RESULTS CONSOLE GC/CHLAMYDIA
Chlamydia: NEGATIVE
Neisseria Gonorrhea: NEGATIVE

## 2023-07-19 LAB — OB RESULTS CONSOLE HEPATITIS B SURFACE ANTIGEN: Hepatitis B Surface Ag: NEGATIVE

## 2023-07-20 DIAGNOSIS — L308 Other specified dermatitis: Secondary | ICD-10-CM | POA: Diagnosis not present

## 2023-07-20 LAB — OB RESULTS CONSOLE GBS: GBS: POSITIVE

## 2023-08-31 DIAGNOSIS — Z363 Encounter for antenatal screening for malformations: Secondary | ICD-10-CM | POA: Diagnosis not present

## 2023-08-31 DIAGNOSIS — Z3482 Encounter for supervision of other normal pregnancy, second trimester: Secondary | ICD-10-CM | POA: Diagnosis not present

## 2023-08-31 DIAGNOSIS — Z3A18 18 weeks gestation of pregnancy: Secondary | ICD-10-CM | POA: Diagnosis not present

## 2023-11-01 DIAGNOSIS — Z3689 Encounter for other specified antenatal screening: Secondary | ICD-10-CM | POA: Diagnosis not present

## 2023-11-01 LAB — OB RESULTS CONSOLE RPR: RPR: NONREACTIVE

## 2023-11-02 LAB — OB RESULTS CONSOLE HIV ANTIBODY (ROUTINE TESTING): HIV: NONREACTIVE

## 2023-11-08 DIAGNOSIS — O4290 Premature rupture of membranes, unspecified as to length of time between rupture and onset of labor, unspecified weeks of gestation: Secondary | ICD-10-CM | POA: Diagnosis not present

## 2023-11-08 DIAGNOSIS — Z3A28 28 weeks gestation of pregnancy: Secondary | ICD-10-CM | POA: Diagnosis not present

## 2023-11-08 DIAGNOSIS — R7309 Other abnormal glucose: Secondary | ICD-10-CM | POA: Diagnosis not present

## 2023-11-28 ENCOUNTER — Emergency Department (HOSPITAL_BASED_OUTPATIENT_CLINIC_OR_DEPARTMENT_OTHER): Payer: Medicaid Other

## 2023-11-28 ENCOUNTER — Encounter (HOSPITAL_BASED_OUTPATIENT_CLINIC_OR_DEPARTMENT_OTHER): Payer: Self-pay

## 2023-11-28 ENCOUNTER — Emergency Department (HOSPITAL_BASED_OUTPATIENT_CLINIC_OR_DEPARTMENT_OTHER)
Admission: EM | Admit: 2023-11-28 | Discharge: 2023-11-28 | Disposition: A | Discharge status: Home / Self Care | Payer: Worker's Compensation | Attending: Emergency Medicine | Admitting: Emergency Medicine

## 2023-11-28 DIAGNOSIS — O9A213 Injury, poisoning and certain other consequences of external causes complicating pregnancy, third trimester: Secondary | ICD-10-CM | POA: Insufficient documentation

## 2023-11-28 DIAGNOSIS — Z3A31 31 weeks gestation of pregnancy: Secondary | ICD-10-CM | POA: Insufficient documentation

## 2023-11-28 DIAGNOSIS — S0093XA Contusion of unspecified part of head, initial encounter: Secondary | ICD-10-CM | POA: Diagnosis not present

## 2023-11-28 DIAGNOSIS — W19XXXA Unspecified fall, initial encounter: Secondary | ICD-10-CM | POA: Insufficient documentation

## 2023-11-28 DIAGNOSIS — O10013 Pre-existing essential hypertension complicating pregnancy, third trimester: Secondary | ICD-10-CM | POA: Insufficient documentation

## 2023-11-28 DIAGNOSIS — S0083XA Contusion of other part of head, initial encounter: Secondary | ICD-10-CM | POA: Diagnosis not present

## 2023-11-28 MED ORDER — ACETAMINOPHEN 325 MG PO TABS
650.0000 mg | ORAL_TABLET | Freq: Once | ORAL | Status: AC
  Administered 2023-11-28: 650 mg via ORAL
  Filled 2023-11-28: qty 2

## 2023-11-28 MED ORDER — LIDOCAINE 5 % EX PTCH
1.0000 | MEDICATED_PATCH | CUTANEOUS | Status: DC
  Administered 2023-11-28: 1 via TRANSDERMAL
  Filled 2023-11-28: qty 1

## 2023-11-28 NOTE — ED Notes (Signed)
Discharge instructions reviewed with patient. Patient verbalizes understanding, no further questions at this time. Medications and follow up information provided. No acute distress noted at time of departure.  

## 2023-11-28 NOTE — Progress Notes (Signed)
[facility] called and asked to adjust fhr monitor

## 2023-11-28 NOTE — ED Notes (Signed)
Called OB rapid response RN, Denny Peon to inform of patient. Placing on [place].   Pt states decreased fetal movement. FHR 153

## 2023-11-28 NOTE — ED Triage Notes (Signed)
Was at work and walking to car and slipped on ice and fell backwards onto concrete. C/o tailbone pain, posterior head pain. Denies abdominal pain or leaking of fluids. 31weeks, EDD 01/29/24.  OBGYN: Willodean Rosenthal, [place], plans to deliver at [facility]  G6 P3, 2 miscarriages

## 2023-11-28 NOTE — ED Provider Notes (Signed)
[facility] Provider Note   CSN: [id number] Arrival date & time: 11/28/23  1259     History  Chief Complaint  Patient presents with   Shelly Adams    Shelly Adams is a 32 y.o. female G1P3 and currently [redacted] weeks pregnant with past medical history of HTN, ADHD, NASH, anxiety presents emergency department for evaluation of posterior hematoma and tailbone pain following fall on ice at 1200 today. She reports that she was walking to her car down a mild concrete slope when she slipped on ice and fell onto back of her head and bottom. She states she has not felt baby move since fall. She denies LOC, visual disturbances, vaginal bleeding.  HPI   Home Medications Prior to Admission medications   Medication Sig Start Date End Date Taking? Authorizing Provider  acetaminophen (TYLENOL) 500 MG tablet Take 500 mg by mouth every 6 (six) hours as needed for mild pain or headache.    [provider]  ibuprofen (ADVIL) 600 MG tablet Take 1 tablet (600 mg total) by mouth every 6 (six) hours as needed for moderate pain. 07/01/21   Meisinger, Tawanna Cooler, MD  Prenatal Vit-Fe Fumarate-FA (PRENATAL MULTIVITAMIN) TABS tablet Take 1 tablet by mouth daily at 12 noon.    [provider]      Allergies    Metoprolol, Clonidine, Butorphanol, and Tramadol    Review of Systems   Review of Systems  Constitutional:  Negative for chills, fatigue and fever.  Respiratory:  Negative for cough, chest tightness, shortness of breath and wheezing.   Cardiovascular:  Negative for chest pain and palpitations.  Gastrointestinal:  Negative for abdominal pain, constipation, diarrhea, nausea and vomiting.  Genitourinary:  Negative for vaginal bleeding.  Musculoskeletal:  Positive for arthralgias.  Neurological:  Negative for dizziness, seizures, syncope, weakness, light-headedness, numbness and headaches.    Physical Exam Updated Vital Signs BP (!) 153/96 (BP Location:  Left Arm)   Pulse 100   Temp 99.2 F (37.3 C) (Oral)   Resp (!) 22   Ht 5\' 3"  (1.6 m)   Wt 73.9 kg   SpO2 100%   BMI 28.87 kg/m  Physical Exam Vitals and nursing note reviewed. Exam conducted with a chaperone present.  Constitutional:      General: She is not in acute distress.    Appearance: Normal appearance. She is not diaphoretic.  HENT:     Head: No raccoon eyes or Battle's sign.     Comments: Hematoma to posterior cranium    Right Ear: External ear normal.     Left Ear: External ear normal.     Ears:     Comments: No hemotympanum    Nose: Nose normal.     Comments: No epistaxis nor dried blood in nostrils bilaterally    Mouth/Throat:     Comments: No damage to tongue nor oral muscoa Eyes:     General:        Right eye: No discharge.        Left eye: No discharge.     Extraocular Movements: Extraocular movements intact.     Conjunctiva/sclera: Conjunctivae normal.     Pupils: Pupils are equal, round, and reactive to light.     Comments: No subconjunctival hemorrhage, hyphema, tear drop pupil, or fluid leakage bilaterally  Neck:     Vascular: No carotid bruit.     Comments: No crepitus, step-off, deformity, nor TTP to cervical, thoracic, lumbar spine Cardiovascular:  Rate and Rhythm: Normal rate.     Pulses: Normal pulses.     Comments: Radial and Dorsalis Pedis pulses 2+ equal bilaterally Pulmonary:     Effort: Pulmonary effort is normal. No respiratory distress.     Breath sounds: Normal breath sounds. No wheezing.  Chest:     Chest wall: No tenderness.  Abdominal:     General: Bowel sounds are normal. There is no distension.     Palpations: Abdomen is soft.     Tenderness: There is no abdominal tenderness. There is no guarding or rebound.  Genitourinary:    Rectum: Normal.     Comments: No vaginal bleeding Musculoskeletal:     Cervical back: Normal range of motion and neck supple. No rigidity or tenderness.     Right lower leg: No edema.     Left  lower leg: No edema.     Comments: TTP of midspinous processes of coccyx No obvious deformity to joints or long bones Pelvis stable with no shortening or rotation of LE bilaterally  Skin:    General: Skin is warm and dry.     Capillary Refill: Capillary refill takes less than 2 seconds.     Comments: No ecchymosis to chest, abdomen, back  Neurological:     General: No focal deficit present.     Mental Status: She is alert and oriented to person, place, and time. Mental status is at baseline.     GCS: GCS eye subscore is 4. GCS verbal subscore is 5. GCS motor subscore is 6.     Cranial Nerves: Cranial nerves 2-12 are intact. No cranial nerve deficit.     Sensory: Sensation is intact. No sensory deficit.     Motor: Motor function is intact. No weakness or tremor.     Coordination: Coordination is intact. Finger-Nose-Finger Test and Heel to [place] Test normal.     Gait: Gait is intact.     Deep Tendon Reflexes: Reflexes are normal and symmetric.     Comments: Acting following commands appropriately   Alejandro Mulling RN present for GU exam ED Results / Procedures / Treatments   Labs (all labs ordered are listed, but only abnormal results are displayed) Labs Reviewed - No data to display  EKG None  Radiology No results found.  Procedures Procedures    Medications Ordered in ED Medications  lidocaine (LIDODERM) 5 % 1 patch (1 patch Transdermal Patch Applied 11/28/23 1341)  acetaminophen (TYLENOL) tablet 650 mg (650 mg Oral Given 11/28/23 1342)    ED Course/ Medical Decision Making/ A&P                                 Medical Decision Making Amount and/or Complexity of Data Reviewed Radiology: ordered.  Risk OTC drugs. Prescription drug management.   Patient presents to the ED for concern of pain following fall, this involves an extensive number of treatment options, and is a complaint that carries with it a high risk of complications and morbidity.  The differential  diagnosis includes ICH, fracture, hematoma.   Co morbidities that complicate the patient evaluation  [redacted] weeks pregnant currently   Additional history obtained:  Additional history obtained from Nursing and Outside Medical Records   External records from outside source obtained and reviewed including triage RN note   Imaging Studies ordered:  I ordered imaging studies including CT head WO con  I independently visualized and interpreted imaging  which showed no acute ICH I agree with the radiologist interpretation   Medicines ordered and prescription drug management:  I ordered medication including tylenol and lido patch  for pain  Reevaluation of the patient after these medicines showed that the patient improved I have reviewed the patients home medicines and have made adjustments as needed   Consultations Obtained:  I requested consultation with the Rapid Response OB nurse (Erin Foley RN),  and discussed lab and imaging findings as well as pertinent plan - they recommend: 20 minutes continues fetal monitoring and tocometry Following 20 minutes she was cleared by OB   Problem List / ED Course:  Fall, initial encounter PE significant for TTP of coccyx and hematoma to posterior cranium Will get CT head due to mechanism and large hematoma As patient has no ecchymosis to skin on back nor obvious crepitus felt, will attempt to control pain with tylenol and lido patches then reassess prior to obtaining imaging as patient is pregnant and most likely imaging would not change treatment plan Patient continues to have pain to tailbone with difficulty sitting. Recommended donut pillow and provided her with one week off from work as she sits for work. She is to follow up with her PCP if she needs further accommodations following Shared decision making is had regarding XR for coccyx integrity. With some mild radiation risk to baby and treatment plan not significantly changing following  imaging results, we decided to forgo imaging. She is cleared from medical and OB standpoint   Reevaluation:  After the interventions noted above, I reevaluated the patient and found that they have :improved   Social Determinants of Health:  Has PCP and OB F/U   Dispostion:  After consideration of the diagnostic results and the patients response to treatment, I feel that the patent would benefit from outpatient management and follow up with OB. Discussed findings, disposition, return to ED precautions with patient who expresses understanding and agrees with plan. Discussed using Tylenol as needed for pain. All questions answered to her satisfaction.   Discussed patient, PE, disposition, findings with Dr. Jacqulyn Bath who agrees with plan       Final Clinical Impression(s) / ED Diagnoses Final diagnoses:  Fall, initial encounter  Traumatic hematoma of head, initial encounter    Rx / DC Orders ED Discharge Orders     None         Judithann Sheen, PA 11/28/23 1550    Maia Plan, MD 12/02/23 [phone]

## 2023-11-28 NOTE — Progress Notes (Signed)
[facility] notified that fhr is reactive and reassuring and that pt is OB cleared at this time.  Pt informed to follow up at her regular OBGYN appt.

## 2023-11-28 NOTE — Discharge Instructions (Addendum)
Thank you for letting us take care of you today. Your head imaging was negative for bleeding in brain. As for your tailbone, it is likely is is fractured or brusied. I have provided you with a work note for a week and you can follow up with your primary care doctor regarding longer time off or work accommodations if necessary. Please make sure to use donut pillow if sitting but if it is fractured, it is likely to hurt for several weeks.  OB has cleared you from their standpoint and are happy with baby's progression.   Please return to ED if you experience abdominal pain, cramps, contractions, vaginal bleeding, altered mental status, decreased fetal movement

## 2023-11-28 NOTE — Progress Notes (Signed)
[facility] informed of pt who is G5P3 at 31.[redacted]wks pregnant.  This patient slipped on some ice and fell backwards hitting her head and her tailbone on concrete.  She is currently having some tailbone pain and head pain.  She denies abdominal pain, LOF and vag bleeding.  She says she has not noticed much fetal movement since fall.  Pt receives [facility].  FHR monitors being applied.  [facility] will assess remotely.

## 2023-11-28 NOTE — ED Notes (Signed)
Received call from [facility] that patient is cleared by OB

## 2023-11-28 NOTE — Progress Notes (Signed)
Dr Clint Lipps notified of pt.  She is updated on fhr tracing and elevated BPs.  Dr Clint Lipps says that if fhr tracing is reactive and reassuring, pt may be OB cleared and will just need to follow up at her normal OBGYN appt.

## 2023-11-28 NOTE — ED Notes (Signed)
Patient transported to CT 

## 2023-11-28 NOTE — Progress Notes (Signed)
[facility] asked to adjust fhr monitor

## 2023-12-01 DIAGNOSIS — O9A213 Injury, poisoning and certain other consequences of external causes complicating pregnancy, third trimester: Secondary | ICD-10-CM | POA: Diagnosis not present

## 2023-12-27 NOTE — L&D Delivery Note (Signed)
DELIVERY NOTE  Pt complete and at +2 station with urge to push, resumed at 1324. Epidural controlling pain. Pt pushed and delivered a viable female infant in LOA position. Loose nuchal x1, reduced at perineum. Temp with pushign 99.69F. Anterior and posterior shoulders spontaneously delivered with next two pushes; body easily followed next. Infant placed on mothers abdomen and bulb suction of mouth and nose performed. Cord was then clamped and cut by patient. Cord blood obtained, 3VC. Baby had a vigorous spontaneous cry noted. Placenta then delivered at 1336 intact. Fundal massage performed and pitocin per protocol. Fundus firm. The following lacerations were noted: NONE, EBL 201cc. Mother and baby stable. Counts correct. One dose Unasyn ordered given temp prior to pushing.    Infant time: 26 Gender: female Placenta time: 1336 Apgars: 8/9 Weight: pending skin-to-skin

## 2023-12-28 ENCOUNTER — Encounter: Payer: Self-pay | Admitting: Physical Therapy

## 2023-12-28 ENCOUNTER — Other Ambulatory Visit: Payer: Self-pay

## 2023-12-28 ENCOUNTER — Ambulatory Visit: Payer: Worker's Compensation | Attending: Family Medicine | Admitting: Physical Therapy

## 2023-12-28 DIAGNOSIS — R2689 Other abnormalities of gait and mobility: Secondary | ICD-10-CM | POA: Diagnosis present

## 2023-12-28 DIAGNOSIS — M5459 Other low back pain: Secondary | ICD-10-CM | POA: Insufficient documentation

## 2023-12-28 NOTE — Therapy (Signed)
 OUTPATIENT PHYSICAL THERAPY THORACOLUMBAR EVALUATION  Patient Name: Shelly Adams MRN: [id number] DOB:1991/06/28, 33 y.o., female Today's Date: 12/28/2023   PT End of Session - 12/28/23 1147     Visit Number 1    Number of Visits 12    Date for PT Re-Evaluation 03/21/24    Authorization Type WC - FOTO    PT Start Time 0915    PT Stop Time 0952    PT Time Calculation (min) 37 min             Past Medical History:  Diagnosis Date   Anxiety    Asthma    Benign essential hypertension, antepartum 07/29/2013   On propranolol -->labetalol  [x]  Aspirin  81 mg daily after 12 weeks; discontinue after 36 weeks Current antihypertensives:  Labetalol    Baseline and surveillance labs (pulled in from [facility], refresh links as needed)  Lab Results Component Value Date  PLT 198 10/17/2016  CREATININE 0.82 07/05/2016  AST 20 07/05/2016  ALT 25 07/05/2016  PROTCRRATIO 1.11 (H) 08/29/2013  PROTEIN24HR 231 (H) 08/23/2013 Plt 0.52 Pr:Cr 108  Antenatal Testing CHTN - O10.919  Group I  BP < 140/90, no preeclampsia, AGA,  nml AFV, +/- meds    Group II BP > 140/90, on meds, no preeclampsia, AGA, nml AFV  20-28-34-38  20-24-28-32-35-38  32//2 x wk  28//BPP wkly then 32//2 x wk  40 no meds; 39 meds  PRN or 37 Pre-eclampsia  GHTN - O13.9/Preeclampsia without severe features  - O14.00   Preeclampsia with severe features - O14.10  Q 3-4wks  Q 2 wks  28//BPP wkly then 32//2 x wk  Inpatient  37  PRN or 34     Fatty liver disease, nonalcoholic    Fibroid, uterine 08/01/2013   GBS (group B Streptococcus carrier), +RV culture, currently pregnant 03/05/2017   GDM (gestational diabetes mellitus) 07/23/2018   Headache    Hyperlipemia    Insomnia    Normal delivery 2014   And 2018   Recurrent kidney stones    history of kidney stones   Steatosis of liver suggested by ultrasound 10/31/2016   Past Surgical History:  Procedure Laterality Date   BREAST REDUCTION SURGERY  2009   DILATION AND EVACUATION N/A 07/04/2021   Procedure:  DILATATION AND EVACUATION;  Surgeon: Estelle Service, MD;  Location: [facility] OR;  Service: Gynecology;  Laterality: N/A;   Patient Active Problem List   Diagnosis Date Noted   Missed abortion with fetal demise before 20 completed weeks of gestation 06/30/2021   SVD (spontaneous vaginal delivery) 09/27/2018   Severe Pre-eclampsia superimposed on chronic hypertension, delivered 09/26/2018   Unwanted fertility 08/24/2018   GDM (gestational diabetes mellitus) 07/23/2018   Supervision of high risk pregnancy, antepartum 03/19/2018   Maternal chronic hypertension, third trimester 03/19/2018   Steatosis of liver suggested by ultrasound 10/31/2016   NASH (nonalcoholic steatohepatitis) 10/31/2016   Attention deficit hyperactivity disorder (ADHD) 04/19/2016   Seasonal allergic rhinitis due to pollen 04/14/2016   Asthma 08/01/2013    PCP: Jolee Madelin Patch, MD  REFERRING PROVIDER: Irving Delinda ORN, MD  THERAPY DIAG:  Other low back pain  Other abnormalities of gait and mobility  REFERRING DIAG: sacral contusion  Rationale for Evaluation and Treatment:  Rehabilitation  SUBJECTIVE:  PERTINENT PAST HISTORY:  36 weeks EDD 01/29/24, Gestational DM       PRECAUTIONS: [redacted] weeks pregnant with EDD 01/29/24  WEIGHT BEARING RESTRICTIONS No  FALLS:  Has patient fallen in last 6 months? Yes, Number of  falls: 12/2 fall on black ice  MOI/History of condition:  Onset date: 12/2  SUBJECTIVE STATEMENT  Shelly Adams is a 33 y.o. female who presents to clinic with chief complaint low back pain and sacral pain following fall on black ice in early December.  She was not having back pain before the fall.  Red flags for head injury and injury to fetus (-) in ER following accident.  She has not had any imaging of sacrum d/t the pregnancy.  She reports she may be induced shortly.  She does have some pain in the R thigh with sitting for long periods which she describes as pressure but denies any sensory  changes or significant weakness in her LE's.   Red flags:  denies   Pain:  Are you having pain? Yes Pain location: low back an sacrum NPRS scale:  5/10 to 8/10 Aggravating factors: prolonged sitting or standing Relieving factors: she sleeps on her sides at night Pain description: aching Stage: Subacute 24 hour pattern: best in the morning.   Occupation: desk job  Education Administrator: none  Higher Education Careers Adviser Dominance:  NA  Patient Goals/Specific Activities: reduce pain, protect baby   OBJECTIVE:   DIAGNOSTIC FINDINGS:  CT of head (-)  GENERAL OBSERVATION/GAIT: Slow antalgic gait  SENSATION: Light touch: Appears intact  LUMBAR AROM   Deferred   LE MMT:  MMT Right (Eval) Left (Eval)  Hip flexion (L2, L3)    Knee extension (L3)    Knee flexion    Hip abduction    Hip extension    Hip external rotation    Hip internal rotation    Hip adduction    Ankle dorsiflexion (L4)    Ankle plantarflexion (S1)    Ankle inversion    Ankle eversion    Great Toe ext (L5)    Grossly     (Blank rows = not tested, score listed is out of 5 possible points.  N = WNL, D = diminished, C = clear for gross weakness with myotome testing, * = concordant pain with testing)  SPECIAL TESTS:  Slump: L (-), R (-)  MUSCLE LENGTH: Not tested  LE ROM:  ROM Right (Eval) Left (Eval)  Hip flexion    Hip extension    Hip abduction    Hip adduction    Hip internal rotation    Hip external rotation    Knee flexion    Knee extension    Ankle dorsiflexion    Ankle plantarflexion    Ankle inversion    Ankle eversion      (Blank rows = not tested, N = WNL, * = concordant pain with testing)  Functional Tests  Eval                                                                PALPATION:   TTP lower lumbar spine and sacrum  PATIENT SURVEYS:  FOTO 40 -> 58   TODAY'S TREATMENT  Therapeutic Exercise: Creating, reviewing, and completing below HEP  PATIENT EDUCATION:   POC, diagnosis, prognosis, HEP, and outcome measures.  Pt educated via explanation, demonstration, and handout (HEP).  Pt confirms understanding verbally.   HOME EXERCISE PROGRAM: Access Code: [email or web address] URL: [email or web address] Date: 12/28/2023 Prepared by: Helene Gasmen  Exercises - Quadruped  Rocking Slow  - 1 x daily - 7 x weekly - 3 sets - 10 reps - Seated Swiss Ball Pelvic Circles  - 1 x daily - 7 x weekly - 3 sets - 10 reps - Seated Swiss Ball Lateral Pelvic Tilts  - 1 x daily - 7 x weekly - 3 sets - 10 reps - Pelvic Tilt on a Swiss Ball With Pelvic Contraction  - 1 x daily - 7 x weekly - 3 sets - 10 reps    ASSESSMENT:  CLINICAL IMPRESSION: Shelly Adams is a 33 y.o. female who presents to clinic with signs and sxs consistent with low back and sacral pain following slip and fall on black ice in early December.  MD noted that they would hold imaging until after birth and he was unable to rule out sacral fracture until that time.  No clear signs of radiculopathy on exam.  I provided some option for gentle lumbar movement on swiss ball and in quadruped.  We will set up remaining visits with pelvic health.    OBJECTIVE IMPAIRMENTS: Pain, gait  ACTIVITY LIMITATIONS: bending, sitting, standing, walking  PERSONAL FACTORS: See medical history and pertinent history   REHAB POTENTIAL: Good  CLINICAL DECISION MAKING: Stable/uncomplicated  EVALUATION COMPLEXITY: Low   GOALS:   SHORT TERM GOALS: Target date: 01/25/2024   Shelly Adams will be >75% HEP compliant to improve carryover between sessions and facilitate independent management of condition  Evaluation: ongoing Goal status: INITIAL   LONG TERM GOALS: Target date: 03/21/2024   Shelly Adams will improve FOTO score to 58 as a proxy for functional improvement  Evaluation/Baseline: 40 Goal status: INITIAL    2.  Shelly Adams will self report >/= 50% decrease in pain from evaluation to improve function in daily  tasks  Evaluation/Baseline: 9/10 max pain Goal status: INITIAL   3.  Remaining goals to be established by pelvic health    PLAN: PT FREQUENCY: 1-2x/week  PT DURATION: 12 weeks  PLANNED INTERVENTIONS:  [id number]- PT Re-evaluation, 97110-Therapeutic exercises, 97530- Therapeutic activity, [id number]- Neuromuscular re-education, 97535- Self Care, [id number]- Manual therapy, [id number]- Gait training, [id number]- Aquatic Therapy, [phone]- Electrical stimulation (manual), [id number]- Vasopneumatic device, [id number]- Traction (mechanical), [id number]- Ionotophoresis 4mg /ml Dexamethasone , Taping, Dry Needling, Joint manipulation, and Spinal manipulation.   Leeland Lovelady PT, DPT 12/28/2023, 12:41 PM

## 2024-01-03 DIAGNOSIS — Z3A36 36 weeks gestation of pregnancy: Secondary | ICD-10-CM | POA: Diagnosis not present

## 2024-01-03 DIAGNOSIS — R030 Elevated blood-pressure reading, without diagnosis of hypertension: Secondary | ICD-10-CM | POA: Diagnosis not present

## 2024-01-03 DIAGNOSIS — R9389 Abnormal findings on diagnostic imaging of other specified body structures: Secondary | ICD-10-CM | POA: Diagnosis not present

## 2024-01-03 DIAGNOSIS — O24419 Gestational diabetes mellitus in pregnancy, unspecified control: Secondary | ICD-10-CM | POA: Diagnosis not present

## 2024-01-03 DIAGNOSIS — Z3685 Encounter for antenatal screening for Streptococcus B: Secondary | ICD-10-CM | POA: Diagnosis not present

## 2024-01-10 ENCOUNTER — Other Ambulatory Visit: Payer: Self-pay | Admitting: Student

## 2024-01-10 ENCOUNTER — Telehealth: Payer: Self-pay

## 2024-01-10 DIAGNOSIS — R9389 Abnormal findings on diagnostic imaging of other specified body structures: Secondary | ICD-10-CM

## 2024-01-12 DIAGNOSIS — O24419 Gestational diabetes mellitus in pregnancy, unspecified control: Secondary | ICD-10-CM | POA: Diagnosis not present

## 2024-01-12 DIAGNOSIS — O09293 Supervision of pregnancy with other poor reproductive or obstetric history, third trimester: Secondary | ICD-10-CM | POA: Diagnosis not present

## 2024-01-12 DIAGNOSIS — Z3A37 37 weeks gestation of pregnancy: Secondary | ICD-10-CM | POA: Diagnosis not present

## 2024-01-13 ENCOUNTER — Inpatient Hospital Stay (HOSPITAL_COMMUNITY)
Admission: RE | Admit: 2024-01-13 | Discharge: 2024-01-15 | DRG: 807 | Disposition: A | Discharge status: Home / Self Care | Payer: Medicaid Other | Attending: Obstetrics and Gynecology | Admitting: Obstetrics and Gynecology

## 2024-01-13 ENCOUNTER — Other Ambulatory Visit: Payer: Self-pay

## 2024-01-13 ENCOUNTER — Inpatient Hospital Stay (HOSPITAL_COMMUNITY): Payer: Medicaid Other

## 2024-01-13 ENCOUNTER — Inpatient Hospital Stay (HOSPITAL_COMMUNITY): Payer: Medicaid Other | Admitting: Anesthesiology

## 2024-01-13 ENCOUNTER — Encounter (HOSPITAL_COMMUNITY): Payer: Self-pay | Admitting: Student

## 2024-01-13 DIAGNOSIS — Z3A00 Weeks of gestation of pregnancy not specified: Secondary | ICD-10-CM | POA: Diagnosis not present

## 2024-01-13 DIAGNOSIS — Z8249 Family history of ischemic heart disease and other diseases of the circulatory system: Secondary | ICD-10-CM | POA: Diagnosis not present

## 2024-01-13 DIAGNOSIS — Z3A37 37 weeks gestation of pregnancy: Secondary | ICD-10-CM

## 2024-01-13 DIAGNOSIS — O24420 Gestational diabetes mellitus in childbirth, diet controlled: Secondary | ICD-10-CM | POA: Diagnosis not present

## 2024-01-13 DIAGNOSIS — O403XX0 Polyhydramnios, third trimester, not applicable or unspecified: Secondary | ICD-10-CM | POA: Diagnosis not present

## 2024-01-13 DIAGNOSIS — O99824 Streptococcus B carrier state complicating childbirth: Secondary | ICD-10-CM | POA: Diagnosis present

## 2024-01-13 DIAGNOSIS — O358XX0 Maternal care for other (suspected) fetal abnormality and damage, not applicable or unspecified: Secondary | ICD-10-CM | POA: Diagnosis not present

## 2024-01-13 DIAGNOSIS — O134 Gestational [pregnancy-induced] hypertension without significant proteinuria, complicating childbirth: Principal | ICD-10-CM | POA: Diagnosis present

## 2024-01-13 DIAGNOSIS — O133 Gestational [pregnancy-induced] hypertension without significant proteinuria, third trimester: Secondary | ICD-10-CM

## 2024-01-13 DIAGNOSIS — O43893 Other placental disorders, third trimester: Secondary | ICD-10-CM | POA: Diagnosis not present

## 2024-01-13 DIAGNOSIS — O26893 Other specified pregnancy related conditions, third trimester: Secondary | ICD-10-CM | POA: Diagnosis not present

## 2024-01-13 HISTORY — DX: Gestational (pregnancy-induced) hypertension without significant proteinuria, third trimester: O13.3

## 2024-01-13 LAB — CBC
HCT: 33.2 % — ABNORMAL LOW (ref 36.0–46.0)
HCT: 35.3 % — ABNORMAL LOW (ref 36.0–46.0)
Hemoglobin: 11.5 g/dL — ABNORMAL LOW (ref 12.0–15.0)
Hemoglobin: 12.1 g/dL (ref 12.0–15.0)
MCH: 30.1 pg (ref 26.0–34.0)
MCH: 30.2 pg (ref 26.0–34.0)
MCHC: 34.6 g/dL (ref 30.0–36.0)
MCHC: 34.3 g/dL (ref 30.0–36.0)
MCV: 86.9 fL (ref 80.0–100.0)
MCV: 88 fL (ref 80.0–100.0)
Platelets: 194 10*3/uL (ref 150–400)
Platelets: 190 10*3/uL (ref 150–400)
RBC: 3.82 MIL/uL — ABNORMAL LOW (ref 3.87–5.11)
RBC: 4.01 MIL/uL (ref 3.87–5.11)
RDW: 13.4 % (ref 11.5–15.5)
RDW: 13.6 % (ref 11.5–15.5)
WBC: 8 10*3/uL (ref 4.0–10.5)
WBC: 11 10*3/uL — ABNORMAL HIGH (ref 4.0–10.5)
nRBC: 0 % (ref 0.0–0.2)
nRBC: 0 % (ref 0.0–0.2)

## 2024-01-13 LAB — TYPE AND SCREEN
ABO/RH(D): O POS
Antibody Screen: NEGATIVE

## 2024-01-13 LAB — COMPREHENSIVE METABOLIC PANEL
ALT: 18 U/L (ref 0–44)
AST: 25 U/L (ref 15–41)
Albumin: 2.9 g/dL — ABNORMAL LOW (ref 3.5–5.0)
Alkaline Phosphatase: 111 U/L (ref 38–126)
Anion gap: 10 (ref 5–15)
BUN: 6 mg/dL (ref 6–20)
CO2: 18 mmol/L — ABNORMAL LOW (ref 22–32)
Calcium: 9.1 mg/dL (ref 8.9–10.3)
Chloride: 105 mmol/L (ref 98–111)
Creatinine, Ser: 0.55 mg/dL (ref 0.44–1.00)
GFR, Estimated: >60 mL/min (ref >60)
Glucose, Bld: 87 mg/dL (ref 70–99)
Potassium: 3.6 mmol/L (ref 3.5–5.1)
Sodium: 133 mmol/L — ABNORMAL LOW (ref 135–145)
Total Bilirubin: 0.5 mg/dL (ref 0.0–1.2)
Total Protein: 6.2 g/dL — ABNORMAL LOW (ref 6.5–8.1)

## 2024-01-13 LAB — PROTEIN / CREATININE RATIO, URINE
Creatinine, Urine: 27 mg/dL
Total Protein, Urine: <6 mg/dL

## 2024-01-13 LAB — URIC ACID: Uric Acid, Serum: 3.5 mg/dL (ref 2.5–7.1)

## 2024-01-13 LAB — GLUCOSE, CAPILLARY
Glucose-Capillary: 79 mg/dL (ref 70–99)
Glucose-Capillary: 95 mg/dL (ref 70–99)

## 2024-01-13 LAB — RPR: RPR Ser Ql: NONREACTIVE

## 2024-01-13 MED ORDER — SOD CITRATE-CITRIC ACID 500-334 MG/5ML PO SOLN: 30.0000 mL | ORAL | Status: DC | PRN

## 2024-01-13 MED ORDER — OXYCODONE-ACETAMINOPHEN 5-325 MG PO TABS: 2.0000 | ORAL_TABLET | ORAL | Status: DC | PRN

## 2024-01-13 MED ORDER — LACTATED RINGERS IV SOLN
500.0000 mL | INTRAVENOUS | Status: DC | PRN
  Administered 2024-01-13: 500 mL via INTRAVENOUS
  Administered 2024-01-13: 1000 mL via INTRAVENOUS

## 2024-01-13 MED ORDER — OXYCODONE-ACETAMINOPHEN 5-325 MG PO TABS
1.0000 | ORAL_TABLET | ORAL | Status: DC | PRN
Start: 2024-01-13 — End: 2024-01-13

## 2024-01-13 MED ORDER — EPHEDRINE 5 MG/ML INJ
10.0000 mg | INTRAVENOUS | Status: DC | PRN
Start: 2024-01-13 — End: 2024-01-13
  Filled 2024-01-13: qty 5

## 2024-01-13 MED ORDER — FENTANYL-BUPIVACAINE-NACL 0.5-0.125-0.9 MG/250ML-% EP SOLN
EPIDURAL | Status: DC | PRN
  Administered 2024-01-13: 12 mL/h via EPIDURAL

## 2024-01-13 MED ORDER — OXYTOCIN-SODIUM CHLORIDE 30-0.9 UT/500ML-% IV SOLN: 2.5000 [IU]/h | INTRAVENOUS | Status: DC

## 2024-01-13 MED ORDER — PHENYLEPHRINE 80 MCG/ML (10ML) SYRINGE FOR IV PUSH (FOR BLOOD PRESSURE SUPPORT): 80.0000 ug | PREFILLED_SYRINGE | INTRAVENOUS | Status: DC | PRN

## 2024-01-13 MED ORDER — PHENYLEPHRINE 80 MCG/ML (10ML) SYRINGE FOR IV PUSH (FOR BLOOD PRESSURE SUPPORT)
80.0000 ug | PREFILLED_SYRINGE | INTRAVENOUS | Status: DC | PRN
  Filled 2024-01-13: qty 10

## 2024-01-13 MED ORDER — SIMETHICONE 80 MG PO CHEW: 80.0000 mg | CHEWABLE_TABLET | ORAL | Status: DC | PRN

## 2024-01-13 MED ORDER — ONDANSETRON HCL 4 MG/2ML IJ SOLN: 4.0000 mg | Freq: Four times a day (QID) | INTRAMUSCULAR | Status: DC | PRN

## 2024-01-13 MED ORDER — PRENATAL MULTIVITAMIN CH
1.0000 | ORAL_TABLET | Freq: Every day | ORAL | Status: DC
  Administered 2024-01-14 – 2024-01-15 (×2): 1 via ORAL
  Filled 2024-01-13 (×2): qty 1

## 2024-01-13 MED ORDER — DIBUCAINE (PERIANAL) 1 % EX OINT: 1.0000 | TOPICAL_OINTMENT | CUTANEOUS | Status: DC | PRN

## 2024-01-13 MED ORDER — ACETAMINOPHEN 325 MG PO TABS: 650.0000 mg | ORAL_TABLET | ORAL | Status: DC | PRN

## 2024-01-13 MED ORDER — ONDANSETRON HCL 4 MG/2ML IJ SOLN: 4.0000 mg | INTRAMUSCULAR | Status: DC | PRN

## 2024-01-13 MED ORDER — BENZOCAINE-MENTHOL 20-0.5 % EX AERO: 1.0000 | INHALATION_SPRAY | CUTANEOUS | Status: DC | PRN

## 2024-01-13 MED ORDER — LACTATED RINGERS IV SOLN: 500.0000 mL | Freq: Once | INTRAVENOUS | Status: AC

## 2024-01-13 MED ORDER — ACETAMINOPHEN 325 MG PO TABS
650.0000 mg | ORAL_TABLET | ORAL | Status: DC | PRN
  Administered 2024-01-13 – 2024-01-15 (×4): 650 mg via ORAL
  Filled 2024-01-13 (×4): qty 2

## 2024-01-13 MED ORDER — SODIUM CHLORIDE 0.9 % IV SOLN
3.0000 g | Freq: Once | INTRAVENOUS | Status: AC
  Administered 2024-01-13: 3 g via INTRAVENOUS
  Filled 2024-01-13: qty 8

## 2024-01-13 MED ORDER — DIPHENHYDRAMINE HCL 50 MG/ML IJ SOLN: 12.5000 mg | INTRAMUSCULAR | Status: DC | PRN

## 2024-01-13 MED ORDER — LIDOCAINE HCL (PF) 1 % IJ SOLN: 30.0000 mL | INTRAMUSCULAR | Status: DC | PRN

## 2024-01-13 MED ORDER — IBUPROFEN 600 MG PO TABS
600.0000 mg | ORAL_TABLET | Freq: Four times a day (QID) | ORAL | Status: DC
  Administered 2024-01-13 – 2024-01-15 (×6): 600 mg via ORAL
  Filled 2024-01-13 (×8): qty 1

## 2024-01-13 MED ORDER — OXYTOCIN BOLUS FROM INFUSION
333.0000 mL | Freq: Once | INTRAVENOUS | Status: AC
  Administered 2024-01-13: 333 mL via INTRAVENOUS

## 2024-01-13 MED ORDER — DIPHENHYDRAMINE HCL 25 MG PO CAPS
25.0000 mg | ORAL_CAPSULE | Freq: Four times a day (QID) | ORAL | Status: DC | PRN
Start: 2024-01-13 — End: 2024-01-15

## 2024-01-13 MED ORDER — ONDANSETRON HCL 4 MG PO TABS: 4.0000 mg | ORAL_TABLET | ORAL | Status: DC | PRN

## 2024-01-13 MED ORDER — TETANUS-DIPHTH-ACELL PERTUSSIS 5-2.5-18.5 LF-MCG/0.5 IM SUSY: 0.5000 mL | PREFILLED_SYRINGE | Freq: Once | INTRAMUSCULAR | Status: DC

## 2024-01-13 MED ORDER — SODIUM CHLORIDE 0.9 % IV SOLN
5.0000 10*6.[IU] | Freq: Once | INTRAVENOUS | Status: AC
  Administered 2024-01-13: 5 10*6.[IU] via INTRAVENOUS
  Filled 2024-01-13: qty 5

## 2024-01-13 MED ORDER — WITCH HAZEL-GLYCERIN EX PADS: 1.0000 | MEDICATED_PAD | CUTANEOUS | Status: DC | PRN

## 2024-01-13 MED ORDER — OXYTOCIN-SODIUM CHLORIDE 30-0.9 UT/500ML-% IV SOLN
1.0000 m[IU]/min | INTRAVENOUS | Status: DC
  Administered 2024-01-13: 2 m[IU]/min via INTRAVENOUS
  Filled 2024-01-13: qty 500

## 2024-01-13 MED ORDER — SENNOSIDES-DOCUSATE SODIUM 8.6-50 MG PO TABS
2.0000 | ORAL_TABLET | Freq: Every day | ORAL | Status: DC
  Administered 2024-01-14 – 2024-01-15 (×2): 2 via ORAL
  Filled 2024-01-13 (×2): qty 2

## 2024-01-13 MED ORDER — TERBUTALINE SULFATE 1 MG/ML IJ SOLN
0.2500 mg | Freq: Once | INTRAMUSCULAR | Status: DC | PRN
Start: 2024-01-13 — End: 2024-01-13

## 2024-01-13 MED ORDER — FENTANYL-BUPIVACAINE-NACL 0.5-0.125-0.9 MG/250ML-% EP SOLN
12.0000 mL/h | EPIDURAL | Status: DC | PRN
  Filled 2024-01-13: qty 250

## 2024-01-13 MED ORDER — LIDOCAINE HCL (PF) 1 % IJ SOLN
INTRAMUSCULAR | Status: DC | PRN
  Administered 2024-01-13: 5 mL via EPIDURAL

## 2024-01-13 MED ORDER — PENICILLIN G POT IN DEXTROSE 60000 UNIT/ML IV SOLN
3.0000 10*6.[IU] | INTRAVENOUS | Status: DC
  Administered 2024-01-13 (×2): 3 10*6.[IU] via INTRAVENOUS
  Filled 2024-01-13 (×2): qty 50

## 2024-01-13 MED ORDER — COCONUT OIL OIL: 1.0000 | TOPICAL_OIL | Status: DC | PRN

## 2024-01-13 MED ORDER — EPHEDRINE 5 MG/ML INJ
10.0000 mg | INTRAVENOUS | Status: DC | PRN
Start: 2024-01-13 — End: 2024-01-13

## 2024-01-13 MED ORDER — ZOLPIDEM TARTRATE 5 MG PO TABS: 5.0000 mg | ORAL_TABLET | Freq: Every evening | ORAL | Status: DC | PRN

## 2024-01-13 NOTE — Anesthesia Procedure Notes (Signed)
Epidural Patient location during procedure: OB Start time: 01/13/2024 9:28 AM End time: 01/13/2024 9:43 AM  Staffing Anesthesiologist: Trevor Iha, MD Performed: anesthesiologist   Preanesthetic Checklist Completed: patient identified, IV checked, site marked, risks and benefits discussed, surgical consent, monitors and equipment checked, pre-op evaluation and timeout performed  Epidural Patient position: sitting Prep: DuraPrep and site prepped and draped Patient monitoring: continuous pulse ox and blood pressure Approach: midline Location: L3-L4 Injection technique: LOR air  Needle:  Needle type: Tuohy  Needle gauge: 17 G Needle length: 9 cm and 9 Needle insertion depth: 7 cm Catheter type: closed end flexible Catheter size: 19 Gauge Catheter at skin depth: 13 cm Test dose: negative  Assessment Events: blood not aspirated, no cerebrospinal fluid, injection not painful, no injection resistance, no paresthesia and negative IV test  Additional Notes Patient identified. Risks/Benefits/Options discussed with patient including but not limited to bleeding, infection, nerve damage, paralysis, failed block, incomplete pain control, headache, blood pressure changes, nausea, vomiting, reactions to medication both or allergic, itching and postpartum back pain. Confirmed with bedside nurse the patient's most recent platelet count. Confirmed with patient that they are not currently taking any anticoagulation, have any bleeding history or any family history of bleeding disorders. Patient expressed understanding and wished to proceed. All questions were answered. Sterile technique was used throughout the entire procedure. Please see nursing notes for vital signs. Test dose was given through epidural needle and negative prior to continuing to dose epidural or start infusion. Warning signs of high block given to the patient including shortness of breath, tingling/numbness in hands, complete  motor block, or any concerning symptoms with instructions to call for help. Patient was given instructions on fall risk and not to get out of bed. All questions and concerns addressed with instructions to call with any issues.  1 Attempt (S) . Patient tolerated procedure well.

## 2024-01-13 NOTE — Anesthesia Preprocedure Evaluation (Addendum)
Anesthesia Evaluation  Patient identified by MRN, date of birth, ID band Patient awake    Reviewed: Allergy & Precautions, NPO status , Patient's Chart, lab work & pertinent test results  Airway Mallampati: II  TM Distance: >3 FB Neck ROM: Full    Dental no notable dental hx. (+) Teeth Intact, Dental Advisory Given   Pulmonary asthma    Pulmonary exam normal breath sounds clear to auscultation       Cardiovascular hypertension, Normal cardiovascular exam Rhythm:Regular Rate:Normal     Neuro/Psych  Headaches    GI/Hepatic   Endo/Other  diabetes    Renal/GU      Musculoskeletal   Abdominal   Peds  Hematology Lab Results      Component                Value               Date                      WBC                      8.0                 01/13/2024                HGB                      11.5 (L)            01/13/2024                HCT                      33.2 (L)            01/13/2024                MCV                      86.9                01/13/2024                PLT                      194                 01/13/2024              Anesthesia Other Findings   Reproductive/Obstetrics (+) Pregnancy                              Anesthesia Physical Anesthesia Plan  ASA: 3  Anesthesia Plan: Epidural   Post-op Pain Management:    Induction:   PONV Risk Score and Plan:   Airway Management Planned:   Additional Equipment:   Intra-op Plan:   Post-operative Plan:   Informed Consent: I have reviewed the patients History and Physical, chart, labs and discussed the procedure including the risks, benefits and alternatives for the proposed anesthesia with the patient or authorized representative who has indicated his/her understanding and acceptance.       Plan Discussed with:   Anesthesia Plan Comments: (37.5 wk G5P3 w gHtn gDm)         Anesthesia Quick  Evaluation

## 2024-01-13 NOTE — Progress Notes (Addendum)
Clear AROM @ 1026 with copious clear fluid, controlled and engaged afterwards. Recently, recurrent variables noted (approx 20m) with CE 7/90/-1, however CE now complete/0, no cord palpable, will begin pushing BP (!) 140/86   Pulse 87   Temp 97.7 F (36.5 C) (Axillary)   Resp 18   Ht 5\' 1"  (1.549 m)   Wt 77.5 kg   SpO2 100%   BMI 32.29 kg/m  Pitocin at 76mU/min, TOCO q34m Anticipate SVD ADDENDUM: 12:35 - will pause pushing, minimal descent thus far, only mod urge to push

## 2024-01-13 NOTE — Lactation Note (Signed)
This note was copied from a baby's chart. Lactation Consultation Note  Patient Name: Shelly Adams [id number] Date: 01/13/2024 Age:34 hours  MOB declined LC services on MBU.    Maternal Data    Feeding    LATCH Score                    Lactation Tools Discussed/Used    Interventions    Discharge    Consult Status      Frederico Hamman 01/13/2024, 8:00 PM

## 2024-01-13 NOTE — H&P (Signed)
Shelly Adams is a 33 y.o. female [id number] @ 37.5 (EDD: 01/29/2024 by LMP cw 11 wk [place]) presenting for induction of labor.  Pregnancy is complicated by:  - GHTN vs cHTN: By review of her BP logs, elevated BP has been primarily related to her pregnancies. Her BP was normal in this pregnancy until 36 weeks. Hx preE x 2.She has declined meds in the past because of how it makes her feel. - GDMA1: Patient reports she checks her sugars "when she remembers". She is unable to provide glucose ranges for FBG and 1 hour PP  - Borderline polyhydramnios: AFI 25 on 1/17 - Trauma in pregnancy: had fall at 31 weeks with suspected coccyx fracture - Fetal abdominal cyst: 2.4 cm, noted at 36 week visit, not present at anatomy US. Referred to MFM but appt upcoming.  OB History     Gravida  5   Para  3   Term  3   Preterm      AB  1   Living  3      SAB      IAB      Ectopic      Multiple  0   Live Births  3          Past Medical History:  Diagnosis Date   Anxiety    Asthma    Benign essential hypertension, antepartum 07/29/2013   On propranolol-->labetalol [x]  Aspirin 81 mg daily after 12 weeks; discontinue after 36 weeks Current antihypertensives:  Labetalol   Baseline and surveillance labs (pulled in from [facility], refresh links as needed)  Lab Results Component Value Date  PLT 198 10/17/2016  CREATININE 0.82 07/05/2016  AST 20 07/05/2016  ALT 25 07/05/2016  PROTCRRATIO 1.11 (H) 08/29/2013  PROTEIN24HR 231 (H) 08/23/2013 Plt 0.52 Pr:Cr 108  Antenatal Testing CHTN - O10.919  Group I  BP < 140/90, no preeclampsia, AGA,  nml AFV, +/- meds    Group II BP > 140/90, on meds, no preeclampsia, AGA, nml AFV  20-28-34-38  20-24-28-32-35-38  32//2 x wk  28//BPP wkly then 32//2 x wk  40 no meds; 39 meds  PRN or 37 Pre-eclampsia  GHTN - O13.9/Preeclampsia without severe features  - O14.00   Preeclampsia with severe features - O14.10  Q 3-4wks  Q 2 wks  28//BPP wkly then 32//2 x wk  Inpatient  37  PRN or 34      Fatty liver disease, nonalcoholic    Fibroid, uterine 08/01/2013   GBS (group B Streptococcus carrier), +RV culture, currently pregnant 03/05/2017   GDM (gestational diabetes mellitus) 07/23/2018   Headache    Hyperlipemia    Insomnia    Normal delivery 2014   And 2018   Recurrent kidney stones    history of kidney stones   Steatosis of liver suggested by ultrasound 10/31/2016   Past Surgical History:  Procedure Laterality Date   BREAST REDUCTION SURGERY  2009   DILATION AND EVACUATION N/A 07/04/2021   Procedure: DILATATION AND EVACUATION;  Surgeon: Huel Cote, MD;  Location: [facility] OR;  Service: Gynecology;  Laterality: N/A;   Family History: family history includes GER disease in her mother; Hyperlipidemia in her father and mother; Hypertension in her father and mother. Social History:  reports that she has never smoked. She has never used smokeless tobacco. She reports that she does not drink alcohol and does not use drugs.     Maternal Diabetes: Yes: diabetes, unable to assess control Genetic Screening: Normal Maternal  Ultrasounds/Referrals: Other:abdominal cyst Fetal Ultrasounds or other Referrals:  Referred to Materal Fetal Medicine  Maternal Substance Abuse:  No Significant Maternal Medications:  None Significant Maternal Lab Results:  Group B Strep positive Number of Prenatal Visits:greater than 3 verified prenatal visits Maternal Vaccinations:Flu, declines tdap and RSV Other Comments:  None  Review of Systems  Constitutional:  Negative for chills and fever.  Respiratory:  Negative for chest tightness and shortness of breath.   Cardiovascular:  Negative for chest pain.  Gastrointestinal:  Positive for abdominal pain. Negative for constipation.  Genitourinary:  Positive for pelvic pain. Negative for vaginal bleeding and vaginal discharge.  Musculoskeletal:  Negative for gait problem.  Neurological:  Negative for light-headedness and headaches.   History Dilation:  4 Effacement (%): 50 Station: -3 Exam by:: Shelly Marougas,RN Blood pressure (!) 147/82, pulse 81, temperature 98.1 F (36.7 C), temperature source Oral, resp. rate 16, height 5\' 1"  (1.549 m), weight 77.5 kg, SpO2 100%, unknown if currently breastfeeding.  Exam Physical Exam Constitutional:      General: She is not in acute distress.    Appearance: Normal appearance.  HENT:     Head: Normocephalic and atraumatic.  Pulmonary:     Effort: Pulmonary effort is normal.  Musculoskeletal:        General: Normal range of motion.     Right lower leg: No edema.     Left lower leg: No edema.  Skin:    General: Skin is warm and dry.  Neurological:     Mental Status: She is alert.  Psychiatric:        Mood and Affect: Mood normal.        Behavior: Behavior normal.     Prenatal labs: ABO, Rh: --/--/O POS (01/18 0349) Antibody: NEG (01/18 0349) Rubella: Immune (07/22 0000) RPR: Nonreactive (11/06 0000)  HBsAg: Negative (07/24 0000)  HIV: Non-reactive (11/07 0000)  GBS: Positive/-- (07/25 0000)   Assessment/Plan: 33 yo [name] @ 37.5 presents for scheduled IOL  - IOL: Bulging bag on exam per RN. Will start pitocin then plan for AROM after 4 hours of abx - GBS: PCN - GHTN vs cHTN with exacerbation: BP mild range. PreE labs pending - Epidural when requested   Shelly Adams 01/13/2024, 5:23 AM

## 2024-01-13 NOTE — Anesthesia Postprocedure Evaluation (Signed)
Anesthesia Post Note  Patient: Shelly Adams  Procedure(s) Performed: AN AD HOC LABOR EPIDURAL     Patient location during evaluation: Mother Baby Anesthesia Type: Epidural Level of consciousness: awake and alert Pain management: pain level controlled Vital Signs Assessment: post-procedure vital signs reviewed and stable Respiratory status: spontaneous breathing, nonlabored ventilation and respiratory function stable Cardiovascular status: stable Postop Assessment: no headache, no backache and epidural receding Anesthetic complications: no   No notable events documented.  Last Vitals:  Vitals:   01/13/24 1430 01/13/24 1500  BP: (!) 152/87 (!) 143/89  Pulse: 96 (!) 106  Resp: 18 17  Temp:    SpO2:      Last Pain:  Vitals:   01/13/24 1500  TempSrc:   PainSc: 0-No pain   Pain Goal:                   Symone Cornman

## 2024-01-13 NOTE — Progress Notes (Signed)
Labor Note  S: Occ cramping but otherwise well  O: BP 137/77   Pulse 80   Temp 98 F (36.7 C) (Oral)   Resp 18   Ht 5\' 1"  (1.549 m)   Wt 77.5 kg   SpO2 100%   BMI 32.29 kg/m  CE: deferred this time FHR: Baseline 125, +accels, -decels, mod variability TOCO q5-98m, pitocin at 110mU/min  A/P: This is a 33 y.o. [id number] at [redacted]w[redacted]d  admitted for IOL for worsening BP at term. PNC c/b GDMA1 (poor compliance), borderline polyhydramnios, trauma in pregnancy, fetal abdominal cyst FWB: cat 1 tracing MWB: desires epidural prior to AROM. On PCN for GBS ppx Labor course: Latent labor, plan for controlled AROM after epidural. Normotensive currently and asymptomatic from HTN standpoint. Labs WNL. BG q4hr latent labor.   Anticipate SVD

## 2024-01-14 LAB — CBC
HCT: 33.4 % — ABNORMAL LOW (ref 36.0–46.0)
Hemoglobin: 11.4 g/dL — ABNORMAL LOW (ref 12.0–15.0)
MCH: 30.5 pg (ref 26.0–34.0)
MCHC: 34.1 g/dL (ref 30.0–36.0)
MCV: 89.3 fL (ref 80.0–100.0)
Platelets: 169 10*3/uL (ref 150–400)
RBC: 3.74 MIL/uL — ABNORMAL LOW (ref 3.87–5.11)
RDW: 13.8 % (ref 11.5–15.5)
WBC: 9.3 10*3/uL (ref 4.0–10.5)
nRBC: 0 % (ref 0.0–0.2)

## 2024-01-14 MED ORDER — NIFEDIPINE ER OSMOTIC RELEASE 30 MG PO TB24
30.0000 mg | ORAL_TABLET | Freq: Every day | ORAL | Status: DC
  Administered 2024-01-14: 30 mg via ORAL
  Filled 2024-01-14: qty 1

## 2024-01-14 MED ORDER — NIFEDIPINE ER OSMOTIC RELEASE 30 MG PO TB24
60.0000 mg | ORAL_TABLET | Freq: Every day | ORAL | Status: DC
  Administered 2024-01-15: 60 mg via ORAL
  Filled 2024-01-14: qty 2

## 2024-01-14 MED ORDER — NIFEDIPINE ER OSMOTIC RELEASE 30 MG PO TB24
30.0000 mg | ORAL_TABLET | Freq: Once | ORAL | Status: AC
  Administered 2024-01-14: 30 mg via ORAL
  Filled 2024-01-14: qty 1

## 2024-01-14 NOTE — Progress Notes (Signed)
BP trend 140s-150/90s, still asymptomatic, will given 30mg  Procardia dose now, increase to 60XL in AM at 0600, RN notified BP (!) 148/98   Pulse 82   Temp 97.7 F (36.5 C) (Oral)   Resp 16   Ht 5\' 1"  (1.549 m)   Wt 77.5 kg   SpO2 99%   Breastfeeding Unknown   BMI 32.29 kg/m

## 2024-01-14 NOTE — Progress Notes (Addendum)
MOB was referred for history of anxiety. * Referral screened out by Clinical Social Worker because none of the following criteria appear to apply: ~ History of anxiety/depression during this pregnancy, or of post-partum depression following prior delivery. ~ Diagnosis of anxiety and/or depression within last 3 years. A history of anxiety was noted in MOB's chart 04/2021. MOB was seeing a therapist at the time. Last diagnosis of Anxiety noted 08/13/2019. No mental health concerns noted in MOB's prenatal care record. OR * MOB's symptoms currently being treated with medication and/or therapy. Please contact the Clinical Social Worker if needs arise, by [facility] request, or if MOB scores greater than 9/yes to question 10 on Edinburgh Postpartum Depression Screen.  Signed,  Norberto Sorenson, MSW, LCSWA, LCASA December 30, 2023 10:31 AM

## 2024-01-14 NOTE — Progress Notes (Signed)
The Rn called [facility] regarding increased BPs. The patient was started on procardia 30mg  once a day.

## 2024-01-14 NOTE — Progress Notes (Signed)
POSTPARTUM PROGRESS NOTE  Post Partum Day #1  Subjective:  No acute events overnight.  Pt denies problems with ambulating, voiding or po intake.  She denies nausea or vomiting.  Pain is well controlled.  Lochia Minimal.   Objective: Blood pressure (!) 148/87, pulse 73, temperature 97.8 F (36.6 C), temperature source Oral, resp. rate 18, height 5\' 1"  (1.549 m), weight 77.5 kg, SpO2 98%, unknown if currently breastfeeding.  Physical Exam:  General: alert, cooperative and no distress Lochia:normal flow Chest: CTAB Heart: RRR no m/r/g Abdomen: +BS, soft, nontender Uterine Fundus: firm, 2cm below umbilicus Extremities: neg edema, neg calf TTP BL, neg Homans BL  Recent Labs    01/13/24 1417 01/14/24 0518  HGB 12.1 11.4*  HCT 35.3* 33.4*    Assessment/Plan:  ASSESSMENT: Shelly Adams is a 33 y.o. [id number] s/p SVD @ [redacted]w[redacted]d. PNC c/b GDMA2, polyhydramnios, GHTN.   1) Htn - Nifedipine 30XL started this AM, trend bP, plan to DC in AM 2) GDMA2 - 2hr GTT 3) Abnormal fetal ultrasound -Question fo ovarian cyst vs double bubble on 36wk scan - s/p Peds surgery curbside via peds service, abdominal XRAY ordered WNL. Pelvic ultrasound with doppler ordered Plan for DC home tomorrow with BP check in 1wk    LOS: 1 day

## 2024-01-15 ENCOUNTER — Telehealth: Payer: Self-pay

## 2024-01-15 MED ORDER — NIFEDIPINE ER 60 MG PO TB24: 60.0000 mg | ORAL_TABLET | Freq: Every day | ORAL | 1 refills | Status: DC

## 2024-01-15 MED ORDER — IBUPROFEN 600 MG PO TABS: 600.0000 mg | ORAL_TABLET | Freq: Four times a day (QID) | ORAL | Status: AC

## 2024-01-15 MED ORDER — ACETAMINOPHEN 325 MG PO TABS: 650.0000 mg | ORAL_TABLET | ORAL | Status: AC | PRN

## 2024-01-15 NOTE — Progress Notes (Signed)
POSTPARTUM PROGRESS NOTE  Post Partum Day #2  Subjective:  No acute events overnight.  Pt denies problems with ambulating, voiding or po intake.  She denies nausea or vomiting.  Pain is well controlled.  Lochia Minimal. She reports that she had a mild headache when she woke up this morning, resolved with Tylenol. She denies vision changes, chest pain, shortness of breath, and right upper quadrant pain  Objective:    01/15/2024    6:01 AM 01/14/2024    9:31 PM 01/14/2024    3:00 PM  Vitals with BMI  Systolic 146 158 119  Diastolic 84 104 98  Pulse 83 85 82     Physical Exam:  General: alert, cooperative and no distress Lochia:normal flow Chest: CTAB Heart: RRR no m/r/g Abdomen: soft, nontender Uterine Fundus: firm, 2cm below umbilicus Extremities: neg edema, neg calf TTP BL  Recent Labs    01/13/24 1417 01/14/24 0518  HGB 12.1 11.4*  HCT 35.3* 33.4*    Assessment/Plan:  ASSESSMENT: Shelly Adams is a 33 y.o. [id number] s/p SVD @ [redacted]w[redacted]d. PNC c/b GDMA2, polyhydramnios, GHTN.   1) cHTN vs gHTN- Nifedipine 30mg  BID started on PPD1, converted to Nifedipine 60mg  daily this morning. Will trend Bps with hopes of discharge this afternoon. Per RN, BP overnight 158/104 was taken just after patient was crying. 2) GDMA2 - Plan 6w pp 2hr GTT 3) Newborn with ovarian cyst: Question of ovarian cyst vs double bubble on 36wk scan. Abdominal XRAY WNL. Pelvic ultrasound with simple cyst in the newborn's right ovary measuring 2.0 cm.   Dispo: Anticipate DC home this afternoon pending Bps with BP check in 1wk    LOS: 2 days

## 2024-01-15 NOTE — Discharge Summary (Signed)
Postpartum Discharge Summary  Date of Service updated 1/18-1/20/25     Patient Name: Shelly Adams DOB: 1991-07-27 MRN: [id number]  Date of admission: 01/13/2024 Delivery date:01/13/2024 Delivering provider: Carlisle Cater Date of discharge: 01/15/2024  Admitting diagnosis: Gestational hypertension without significant proteinuria in third trimester [O13.3] Intrauterine pregnancy: [redacted]w[redacted]d     Secondary diagnosis:  Principal Problem:   Gestational hypertension without significant proteinuria in third trimester  Additional problems: gDMA2    Discharge diagnosis: Term Pregnancy Delivered, Gestational Hypertension, and GDM A2                                              Post partum procedures: none Augmentation: AROM and Pitocin Complications: None  Hospital course: Induction of Labor With Vaginal Delivery   33 y.o. yo [id number] at [redacted]w[redacted]d was admitted to the hospital 01/13/2024 for induction of labor.  Indication for induction: Gestational hypertension.  Patient had an labor course complicated by nothing Membrane Rupture Time/Date: 10:26 AM,01/13/2024  Delivery Method:Vaginal, Spontaneous Operative Delivery:N/A Episiotomy: None Lacerations:  None Details of delivery can be found in separate delivery note.  Patient had a postpartum course complicated by mild range blood pressures. Patient is discharged home 01/15/24.  Newborn Data: Birth date:01/13/2024 Birth time:1:32 PM Gender:Female Living status:Living Apgars:8 ,9  Weight:3240 g  Magnesium Sulfate received: No BMZ received: No Rhophylac:No MMR:No T-DaP: declined Flu: Yes RSV Vaccine received: No Transfusion:No Immunizations administered: Immunization History  Administered Date(s) Administered   Influenza,inj,Quad PF,6-35 Mos 01/09/2018   Moderna Sars-Covid-2 Vaccination 07/31/2020, 01/06/2021   Tdap 07/29/2013    Physical exam  Vitals:   01/15/24 0601 01/15/24 0900 01/15/24 1201 01/15/24 1238  BP: (!) 146/84  136/79  129/77  Pulse: 83  (!) 108   Resp:      Temp: 98.1 F (36.7 C)  98 F (36.7 C)   TempSrc: Oral  Oral   SpO2: 99%  100%   Weight:      Height:       General: alert, cooperative, and no distress Lochia: appropriate Uterine Fundus: firm Incision: N/A DVT Evaluation: No evidence of DVT seen on physical exam. Labs: Lab Results  Component Value Date   WBC 9.3 01/14/2024   HGB 11.4 (L) 01/14/2024   HCT 33.4 (L) 01/14/2024   MCV 89.3 01/14/2024   PLT 169 01/14/2024      Latest Ref Rng & Units 01/13/2024    3:06 AM  CMP  Glucose 70 - 99 mg/dL 87   BUN 6 - 20 mg/dL 6   Creatinine 1.09 - 3.23 mg/dL 5.57   Sodium 322 - 025 mmol/L 133   Potassium 3.5 - 5.1 mmol/L 3.6   Chloride 98 - 111 mmol/L 105   CO2 22 - 32 mmol/L 18   Calcium 8.9 - 10.3 mg/dL 9.1   Total Protein 6.5 - 8.1 g/dL 6.2   Total Bilirubin 0.0 - 1.2 mg/dL 0.5   Alkaline Phos 38 - 126 U/L 111   AST 15 - 41 U/L 25   ALT 0 - 44 U/L 18    Edinburgh Score:    11/14/2018    5:43 PM  Edinburgh Postnatal Depression Scale Screening Tool  I have been able to laugh and see the funny side of things. 0  I have looked forward with enjoyment to things. 0  I have blamed myself unnecessarily  when things went wrong. 0  I have been anxious or worried for no good reason. 0  I have felt scared or panicky for no good reason. 0  Things have been getting on top of me. 0  I have been so unhappy that I have had difficulty sleeping. 0  I have felt sad or miserable. 0  I have been so unhappy that I have been crying. 0  The thought of harming myself has occurred to me. 0  Edinburgh Postnatal Depression Scale Total 0      After visit meds:  Allergies as of 01/15/2024       Reactions   Metoprolol Shortness Of Breath   Clonidine Other (See Comments)   fatigue   Butorphanol Anxiety, Palpitations   Tramadol Itching, Rash        Medication List     TAKE these medications    acetaminophen 325 MG tablet Commonly  known as: Tylenol Take 2 tablets (650 mg total) by mouth every 4 (four) hours as needed (for pain scale < 4). What changed:  medication strength how much to take when to take this reasons to take this   ibuprofen 600 MG tablet Commonly known as: ADVIL Take 1 tablet (600 mg total) by mouth every 6 (six) hours. What changed:  when to take this reasons to take this   NIFEdipine 60 MG 24 hr tablet Commonly known as: ADALAT CC Take 1 tablet (60 mg total) by mouth daily. Start taking on: January 16, 2024   prenatal multivitamin Tabs tablet Take 1 tablet by mouth daily at 12 noon.         Discharge home in stable condition Infant Feeding: Breast Infant Disposition:home with mother Discharge instruction: per After Visit Summary and Postpartum booklet. Activity: Advance as tolerated. Pelvic rest for 6 weeks.  Diet: routine diet Anticipated Birth Control:  vasectomy Postpartum Appointment:1 week Additional Postpartum F/U: BP check 1 week Future Appointments:No future appointments. Follow up Visit:  Follow-up Information     [facility]. Schedule an appointment as soon as possible for a visit in 1 week(s).   Contact information: [address]  [address] [place] [address] [phone]                     01/15/2024 Willa Frater, MD

## 2024-01-16 LAB — SURGICAL PATHOLOGY

## 2024-01-24 ENCOUNTER — Telehealth (HOSPITAL_COMMUNITY): Payer: Self-pay | Admitting: *Deleted

## 2024-01-24 NOTE — Telephone Encounter (Signed)
01/24/2024  Name: Shelly Adams MRN: [id number] DOB: 1991-01-20  Reason for Call:  Transition of Care Hospital Discharge Call  Contact Status: Patient Contact Status: Complete  Language assistant needed: Interpreter Mode: Interpreter Not Needed        Follow-Up Questions: Do You Have Any Concerns About Your Health As You Heal From Delivery?: No Do You Have Any Concerns About Your Infants Health?: No  Edinburgh Postnatal Depression Scale:  In the Past 7 Days:    PHQ2-9 Depression Scale:     Discharge Follow-up: Edinburgh score requires follow up?: No  Post-discharge interventions: Reviewed Newborn Safe Sleep Practices  Salena Saner, RN 01/24/2024 16:20

## 2024-01-31 ENCOUNTER — Other Ambulatory Visit (HOSPITAL_COMMUNITY): Payer: Self-pay | Admitting: Family Medicine

## 2024-01-31 DIAGNOSIS — M545 Low back pain, unspecified: Secondary | ICD-10-CM

## 2024-02-02 ENCOUNTER — Ambulatory Visit (HOSPITAL_COMMUNITY)
Admission: RE | Admit: 2024-02-02 | Discharge: 2024-02-02 | Disposition: A | Discharge status: Home / Self Care | Payer: Medicaid Other | Source: Ambulatory Visit | Attending: Family Medicine | Admitting: Family Medicine

## 2024-02-02 DIAGNOSIS — M545 Low back pain, unspecified: Secondary | ICD-10-CM | POA: Diagnosis not present

## 2024-02-23 ENCOUNTER — Other Ambulatory Visit (HOSPITAL_COMMUNITY): Payer: Self-pay | Admitting: Family Medicine

## 2024-02-23 DIAGNOSIS — R102 Pelvic and perineal pain: Secondary | ICD-10-CM

## 2024-02-26 DIAGNOSIS — Z1331 Encounter for screening for depression: Secondary | ICD-10-CM | POA: Diagnosis not present

## 2024-03-25 ENCOUNTER — Ambulatory Visit (HOSPITAL_COMMUNITY): Admission: RE | Admit: 2024-03-25 | Payer: Worker's Compensation | Source: Ambulatory Visit

## 2024-03-26 ENCOUNTER — Ambulatory Visit (HOSPITAL_COMMUNITY)
Admission: RE | Admit: 2024-03-26 | Discharge: 2024-03-26 | Disposition: A | Discharge status: Home / Self Care | Payer: Worker's Compensation | Source: Ambulatory Visit | Attending: Family Medicine | Admitting: Family Medicine

## 2024-03-26 DIAGNOSIS — R102 Pelvic and perineal pain: Secondary | ICD-10-CM | POA: Diagnosis present

## 2024-06-20 DIAGNOSIS — R7303 Prediabetes: Secondary | ICD-10-CM | POA: Diagnosis not present

## 2024-06-20 DIAGNOSIS — Z862 Personal history of diseases of the blood and blood-forming organs and certain disorders involving the immune mechanism: Secondary | ICD-10-CM | POA: Diagnosis not present

## 2024-06-20 DIAGNOSIS — R5383 Other fatigue: Secondary | ICD-10-CM | POA: Diagnosis not present

## 2024-06-20 DIAGNOSIS — R002 Palpitations: Secondary | ICD-10-CM | POA: Diagnosis not present

## 2024-06-20 DIAGNOSIS — E66811 Obesity, class 1: Secondary | ICD-10-CM | POA: Diagnosis not present

## 2024-07-05 ENCOUNTER — Ambulatory Visit: Admitting: Family

## 2024-07-05 ENCOUNTER — Ambulatory Visit (INDEPENDENT_AMBULATORY_CARE_PROVIDER_SITE_OTHER): Admitting: Family

## 2024-07-05 ENCOUNTER — Encounter: Payer: Self-pay | Admitting: Family

## 2024-07-05 ENCOUNTER — Telehealth: Payer: Self-pay

## 2024-07-05 ENCOUNTER — Other Ambulatory Visit (HOSPITAL_COMMUNITY): Payer: Self-pay

## 2024-07-05 VITALS — BP 122/74 | HR 72 | Temp 98.1°F | Wt 172.5 lb

## 2024-07-05 DIAGNOSIS — E6609 Other obesity due to excess calories: Secondary | ICD-10-CM | POA: Insufficient documentation

## 2024-07-05 DIAGNOSIS — Z6832 Body mass index (BMI) 32.0-32.9, adult: Secondary | ICD-10-CM

## 2024-07-05 DIAGNOSIS — E66811 Obesity, class 1: Secondary | ICD-10-CM | POA: Diagnosis not present

## 2024-07-05 MED ORDER — TIRZEPATIDE-WEIGHT MANAGEMENT 2.5 MG/0.5ML ~~LOC~~ SOLN: 2.5000 mg | SUBCUTANEOUS | 1 refills | Status: DC

## 2024-07-05 NOTE — Telephone Encounter (Signed)
 Pharmacy Patient Advocate Encounter  Received notification from [facility] MEDICAID that Prior Authorization for Zepbound  2.5mg /0.37ml has been DENIED.  See denial reason below. No denial letter attached in CMM. Will attach denial letter to Media tab once received.   PA #/Case ID/Reference #: [id number]

## 2024-07-05 NOTE — Patient Instructions (Signed)
 Welcome to [facility], It was a pleasure meeting you today!    As discussed, I have sent your Zepbound  to your pharmacy.  Please schedule a 1-2 month follow up visit after you start the medication.     PLEASE NOTE: If you had any LAB tests please let us  know if you have not heard back within a few days. You may see your results on MyChart before we have a chance to review them but we will give you a call once they are reviewed by us . If we ordered any REFERRALS today, please let us  know if you have not heard from their office within the next week.  Let us  know through MyChart if you are needing REFILLS, or have your pharmacy send us  the request. You can also use MyChart to communicate with me or any office staff.  Please try these tips to maintain a healthy lifestyle: It is important that you exercise regularly at least 30 minutes 5 times a week. Think about what you will eat, plan ahead. Choose whole foods, & think  clean, green, fresh or frozen over canned, processed or packaged foods which are more sugary, salty, and fatty. 70 to 75% of food eaten should be fresh vegetables and protein. 2-3  meals daily with healthy snacks between meals, but must be whole fruit, protein or vegetables. Aim to eat over a 10 hour period when you are active, for example, 7am to 5pm, and then STOP after your last meal of the day, drinking only water.  Shorter eating windows, 6-8 hours, are showing benefits in heart disease and blood sugar regulation. Drink water every day! Shoot for 64 ounces daily = 8 cups, no other drink is as healthy! Fruit juice is best enjoyed in a healthy way, by EATING the fruit.

## 2024-07-05 NOTE — Telephone Encounter (Signed)
 Pharmacy Patient Advocate Encounter   Received notification from Patient Pharmacy that prior authorization for Zepbound  2.5mg /0.66ml is required/requested.   Insurance verification completed.   The patient is insured through [facility] MEDICAID .   Per test claim: PA required; PA started via CoverMyMeds. KEY [id number] . Waiting for clinical questions to populate.

## 2024-07-05 NOTE — Progress Notes (Signed)
 New Patient Office Visit  Subjective:  Patient ID: Shelly Adams, female    DOB: 1991-06-26  Age: 33 y.o. MRN: [id number]  CC:  Chief Complaint  Patient presents with   Establish Care    Pt requesting weight management counseling. Pt previously prescribed Wegovy, but could not take due to the side affects.    HPI Shelly Adams presents for establishing care today. Discussed the use of AI scribe software for clinical note transcription with the patient, who gave verbal consent to proceed.  History of Present Illness Shelly Adams is a 33 year old female who presents for establishment of care and management of weight loss medication.  Obesity  - Current weight is 171 pounds, decreased from 174 pounds after recent [facility] trial. - Experienced significant gastrointestinal side effects (nausea, heartburn, diarrhea) with Tzhncb, leading to discontinuation - Interested in initiating Zepbound  for weight management; insurance coverage available if [facility] [address] [facility] [address] fails, but prior documentation issues have delayed approval - Plans to incorporate exercise, specifically weightlifting, which she engaged in prior to her last pregnancy  Appetite dysregulation - Appetite remains increased since discontinuing breastfeeding in March or April - Breastfeeding ceased approximately 3-4 months ago  Reproductive and contraceptive history - Four children; youngest is 12 old - Husband has undergone vasectomy; no plans for additional children  Assessment & Plan Obesity Obesity managed with calorie restriction, reducing carbs & sweets and trying to increase exercise as able. Previous Wegovy use discontinued due to GI side effects. Considering Zepbound  for better tolerance and weight management. - Submit documentation to insurance for Zepbound  approval, noting Wegovy failure due to GI side effects. - Prescribe Zepbound  starting at 2.5 mg dose. - Advise small, frequent meals to manage  potential nausea and GI side effects. - Instruct to return in 2 months for follow-up, or sooner (1 mo) if ready to increase dose. - Encourage hydration, consume at least 2 liters of water daily, all day long, increase amount if drinking any caffeine. - Consider potential referral to nutritionist or Healthy Weight and Wellness program.  General Health Maintenance Regular exercise recommended for weight management, cardiovascular health, and chronic disease risk reduction. - Encourage regular exercise, at least 20 minutes of cardio daily or every other day. - Discuss benefits of exercise for weight maintenance, stress reduction, and overall health.    Subjective:    Outpatient Medications Prior to Visit  Medication Sig Dispense Refill   acetaminophen  (TYLENOL ) 325 MG tablet Take 2 tablets (650 mg total) by mouth every 4 (four) hours as needed (for pain scale < 4).     ibuprofen  (ADVIL ) 600 MG tablet Take 1 tablet (600 mg total) by mouth every 6 (six) hours.     Prenatal Vit-Fe Fumarate-FA (PRENATAL MULTIVITAMIN) TABS tablet Take 1 tablet by mouth daily at 12 noon.     NIFEdipine  (ADALAT  CC) 60 MG 24 hr tablet Take 1 tablet (60 mg total) by mouth daily. (Patient not taking: Reported on 07/05/2024) 30 tablet 1   No facility-administered medications prior to visit.   Past Medical History:  Diagnosis Date   Anxiety    Asthma    Benign essential hypertension, antepartum 07/29/2013   On propranolol -->labetalol  [x]  Aspirin  81 mg daily after 12 weeks; discontinue after 36 weeks Current antihypertensives:  Labetalol    Baseline and surveillance labs (pulled in from [facility], refresh links as needed)  Lab Results Component Value Date  PLT 198 10/17/2016  CREATININE 0.82 07/05/2016  AST 20 07/05/2016  ALT 25  07/05/2016  PROTCRRATIO 1.11 (H) 08/29/2013  PROTEIN24HR 231 (H) 08/23/2013 Plt 0.52 Pr:Cr 108  Antenatal Testing CHTN - O10.919  Group I  BP < 140/90, no preeclampsia, AGA,  nml AFV, +/- meds    Group  II BP > 140/90, on meds, no preeclampsia, AGA, nml AFV  20-28-34-38  20-24-28-32-35-38  32//2 x wk  28//BPP wkly then 32//2 x wk  40 no meds; 39 meds  PRN or 37 Pre-eclampsia  GHTN - O13.9/Preeclampsia without severe features  - O14.00   Preeclampsia with severe features - O14.10  Q 3-4wks  Q 2 wks  28//BPP wkly then 32//2 x wk  Inpatient  37  PRN or 34     Fatty liver disease, nonalcoholic    Fibroid, uterine 08/01/2013   GBS (group B Streptococcus carrier), +RV culture, currently pregnant 03/05/2017   GDM (gestational diabetes mellitus) 07/23/2018   Gestational hypertension without significant proteinuria in third trimester 01/13/2024   Headache    Hyperlipemia    Insomnia    Maternal chronic hypertension, third trimester 03/19/2018   [X]  Aspirin  81 mg daily after 12 weeks; discontinue after 36 weeks  Current antihypertensives:  None      Baseline and surveillance labs (pulled in from [facility], refresh links as needed)           Lab Results      Component    Value    Date           PLT    234    11/21/2017           CREATININE    0.76    11/21/2017           AST    62 (H)    11/21/2017           ALT    80 (H)    11/21/2017            Missed abortion with fetal demise before 20 completed weeks of gestation 06/30/2021   Anhydramnios diagnosed 7/1, neg FHT 7/5     Normal delivery 2014   And 2018   Recurrent kidney stones    history of kidney stones   Severe Pre-eclampsia superimposed on chronic hypertension, delivered 09/26/2018   Steatosis of liver suggested by ultrasound 10/31/2016   Past Surgical History:  Procedure Laterality Date   BREAST REDUCTION SURGERY  2009   DILATION AND EVACUATION N/A 07/04/2021   Procedure: DILATATION AND EVACUATION;  Surgeon: Estelle Service, MD;  Location: [facility] OR;  Service: Gynecology;  Laterality: N/A;    Objective:   Today's Vitals: BP 122/74   Pulse 72   Temp 98.1 F (36.7 C)   Wt 172 lb 8 oz (78.2 kg)   LMP 07/05/2024   SpO2 98%   BMI 32.59 kg/m    Physical Exam Vitals and nursing note reviewed.  Constitutional:      Appearance: Normal appearance. She is obese.  Cardiovascular:     Rate and Rhythm: Normal rate and regular rhythm.  Pulmonary:     Effort: Pulmonary effort is normal.     Breath sounds: Normal breath sounds.  Musculoskeletal:        General: Normal range of motion.  Skin:    General: Skin is warm and dry.  Neurological:     Mental Status: She is alert.  Psychiatric:        Mood and Affect: Mood normal.        Behavior: Behavior normal.  Meds ordered this encounter  Medications   tirzepatide  (ZEPBOUND ) 2.5 MG/0.5ML injection vial    Sig: Inject 2.5 mg into the skin once a week.    Dispense:  2 mL    Refill:  1    Supervising Provider:   ANDY, CAMILLE L [[id number]]    Lucius Krabbe, NP

## 2024-07-05 NOTE — Assessment & Plan Note (Signed)
 Obesity managed with calorie restriction, reducing carbs & sweets and trying to increase exercise as able. Previous Wegovy use discontinued due to GI side effects. Considering Zepbound  for better tolerance and weight management. - Submit documentation to insurance for Zepbound  approval, noting Wegovy failure due to GI side effects. - Prescribe Zepbound  starting at 2.5 mg dose. - Advise small, frequent meals to manage potential nausea and GI side effects. - Instruct to return in 2 months for follow-up, or sooner (1 mo) if ready to increase dose. - Encourage hydration, consume at least 2 liters of water daily, all day long, increase amount if drinking any caffeine. - Consider potential referral to nutritionist or Healthy Weight and Wellness program.

## 2024-07-05 NOTE — Telephone Encounter (Signed)
 Clinical questions answered and PA submitted.

## 2024-07-09 NOTE — Telephone Encounter (Signed)
 Per insurance, approval now on file.  Patient is aware.

## 2024-08-15 ENCOUNTER — Encounter: Payer: Self-pay | Admitting: Family

## 2024-08-15 ENCOUNTER — Other Ambulatory Visit (HOSPITAL_COMMUNITY): Payer: Self-pay

## 2024-08-15 ENCOUNTER — Other Ambulatory Visit: Payer: Self-pay

## 2024-08-15 DIAGNOSIS — E66811 Other obesity due to excess calories: Secondary | ICD-10-CM

## 2024-08-15 MED ORDER — TIRZEPATIDE-WEIGHT MANAGEMENT 2.5 MG/0.5ML ~~LOC~~ SOLN
2.5000 mg | SUBCUTANEOUS | 0 refills | Status: AC
Start: 2024-08-15 — End: ?

## 2024-08-16 ENCOUNTER — Other Ambulatory Visit: Payer: Self-pay

## 2024-08-16 DIAGNOSIS — Z6832 Body mass index (BMI) 32.0-32.9, adult: Secondary | ICD-10-CM

## 2024-08-16 MED ORDER — TIRZEPATIDE-WEIGHT MANAGEMENT 5 MG/0.5ML ~~LOC~~ SOLN: 5.0000 mg | SUBCUTANEOUS | 1 refills | Status: DC

## 2024-08-16 MED ORDER — TIRZEPATIDE-WEIGHT MANAGEMENT 5 MG/0.5ML ~~LOC~~ SOLN: 5.0000 mg | SUBCUTANEOUS | 0 refills | Status: AC

## 2024-08-16 NOTE — Telephone Encounter (Signed)
 yes

## 2024-08-16 NOTE — Telephone Encounter (Signed)
 RX sent

## 2024-09-19 ENCOUNTER — Encounter: Payer: Self-pay | Admitting: Family

## 2024-09-23 ENCOUNTER — Ambulatory Visit: Admitting: Family

## 2024-10-10 DIAGNOSIS — I10 Essential (primary) hypertension: Secondary | ICD-10-CM | POA: Diagnosis not present

## 2024-10-10 DIAGNOSIS — Z0000 Encounter for general adult medical examination without abnormal findings: Secondary | ICD-10-CM | POA: Diagnosis not present

## 2024-10-10 DIAGNOSIS — K7581 Nonalcoholic steatohepatitis (NASH): Secondary | ICD-10-CM | POA: Diagnosis not present

## 2024-10-10 DIAGNOSIS — Z1331 Encounter for screening for depression: Secondary | ICD-10-CM | POA: Diagnosis not present

## 2024-10-10 DIAGNOSIS — R7303 Prediabetes: Secondary | ICD-10-CM | POA: Diagnosis not present

## 2024-10-10 DIAGNOSIS — E66811 Obesity, class 1: Secondary | ICD-10-CM | POA: Diagnosis not present

## 2024-12-18 ENCOUNTER — Telehealth: Payer: Self-pay

## 2024-12-18 ENCOUNTER — Other Ambulatory Visit (HOSPITAL_COMMUNITY): Payer: Self-pay

## 2024-12-18 NOTE — Telephone Encounter (Signed)
 Pharmacy Patient Advocate Encounter  Received notification from [facility] MEDICAID that Prior Authorization for Zepbound  5mg /0.19ml has been DENIED.  See denial reason below. No denial letter attached in CMM. Will attach denial letter to Media tab once received.   PA #/Case ID/Reference #: [id number]

## 2024-12-18 NOTE — Telephone Encounter (Signed)
 Pharmacy Patient Advocate Encounter   Received notification from Pt Calls Messages that prior authorization for Zepbound  5mg /0.66ml is required/requested.   Insurance verification completed.   The patient is insured through [facility] MEDICAID.   Per test claim: PA required; PA submitted to above mentioned insurance via Latent Key/confirmation #/EOC [id number] Status is pending
